# Patient Record
Sex: Male | Born: 1960 | Race: White | Hispanic: No | Marital: Married | Smoking: Never smoker
Health system: Southern US, Community
[De-identification: ages and names within clinical notes are randomized; demographics above are authoritative.]

## PROBLEM LIST (undated history)

## (undated) DIAGNOSIS — E109 Type 1 diabetes mellitus without complications: Secondary | ICD-10-CM

## (undated) DIAGNOSIS — Z95810 Presence of automatic (implantable) cardiac defibrillator: Secondary | ICD-10-CM

## (undated) DIAGNOSIS — C44621 Squamous cell carcinoma of skin of unspecified upper limb, including shoulder: Secondary | ICD-10-CM

## (undated) DIAGNOSIS — S68119A Complete traumatic metacarpophalangeal amputation of unspecified finger, initial encounter: Secondary | ICD-10-CM

## (undated) DIAGNOSIS — Z5189 Encounter for other specified aftercare: Secondary | ICD-10-CM

## (undated) DIAGNOSIS — M1990 Unspecified osteoarthritis, unspecified site: Secondary | ICD-10-CM

## (undated) DIAGNOSIS — E039 Hypothyroidism, unspecified: Secondary | ICD-10-CM

## (undated) DIAGNOSIS — I2510 Atherosclerotic heart disease of native coronary artery without angina pectoris: Secondary | ICD-10-CM

## (undated) DIAGNOSIS — I629 Nontraumatic intracranial hemorrhage, unspecified: Secondary | ICD-10-CM

## (undated) DIAGNOSIS — I472 Ventricular tachycardia, unspecified: Secondary | ICD-10-CM

## (undated) DIAGNOSIS — E1143 Type 2 diabetes mellitus with diabetic autonomic (poly)neuropathy: Secondary | ICD-10-CM

## (undated) DIAGNOSIS — Z22322 Carrier or suspected carrier of Methicillin resistant Staphylococcus aureus: Secondary | ICD-10-CM

## (undated) DIAGNOSIS — E785 Hyperlipidemia, unspecified: Secondary | ICD-10-CM

## (undated) DIAGNOSIS — I312 Hemopericardium, not elsewhere classified: Secondary | ICD-10-CM

## (undated) DIAGNOSIS — Z95828 Presence of other vascular implants and grafts: Secondary | ICD-10-CM

## (undated) DIAGNOSIS — I10 Essential (primary) hypertension: Secondary | ICD-10-CM

## (undated) DIAGNOSIS — N186 End stage renal disease: Secondary | ICD-10-CM

## (undated) DIAGNOSIS — R011 Cardiac murmur, unspecified: Secondary | ICD-10-CM

## (undated) DIAGNOSIS — T82198A Other mechanical complication of other cardiac electronic device, initial encounter: Secondary | ICD-10-CM

## (undated) DIAGNOSIS — Z992 Dependence on renal dialysis: Secondary | ICD-10-CM

## (undated) DIAGNOSIS — K279 Peptic ulcer, site unspecified, unspecified as acute or chronic, without hemorrhage or perforation: Secondary | ICD-10-CM

## (undated) DIAGNOSIS — H548 Legal blindness, as defined in USA: Secondary | ICD-10-CM

## (undated) DIAGNOSIS — N2581 Secondary hyperparathyroidism of renal origin: Secondary | ICD-10-CM

## (undated) DIAGNOSIS — IMO0002 Reserved for concepts with insufficient information to code with codable children: Secondary | ICD-10-CM

## (undated) DIAGNOSIS — Z89519 Acquired absence of unspecified leg below knee: Secondary | ICD-10-CM

## (undated) DIAGNOSIS — K8010 Calculus of gallbladder with chronic cholecystitis without obstruction: Principal | ICD-10-CM

## (undated) DIAGNOSIS — R0989 Other specified symptoms and signs involving the circulatory and respiratory systems: Secondary | ICD-10-CM

## (undated) DIAGNOSIS — N529 Male erectile dysfunction, unspecified: Secondary | ICD-10-CM

## (undated) DIAGNOSIS — K219 Gastro-esophageal reflux disease without esophagitis: Secondary | ICD-10-CM

## (undated) DIAGNOSIS — I428 Other cardiomyopathies: Secondary | ICD-10-CM

## (undated) HISTORY — DX: Reserved for concepts with insufficient information to code with codable children: IMO0002

## (undated) HISTORY — DX: Gastro-esophageal reflux disease without esophagitis: K21.9

## (undated) HISTORY — PX: INSERT / REPLACE / REMOVE PACEMAKER: SUR710

## (undated) HISTORY — DX: Type 1 diabetes mellitus without complications: E10.9

## (undated) HISTORY — DX: Other cardiomyopathies: I42.8

## (undated) HISTORY — DX: Presence of automatic (implantable) cardiac defibrillator: Z95.810

## (undated) HISTORY — DX: Atherosclerotic heart disease of native coronary artery without angina pectoris: I25.10

## (undated) HISTORY — DX: Essential (primary) hypertension: I10

## (undated) HISTORY — PX: VASCULAR SURGERY: SHX849

## (undated) HISTORY — DX: Carrier or suspected carrier of methicillin resistant Staphylococcus aureus: Z22.322

## (undated) HISTORY — DX: Hyperlipidemia, unspecified: E78.5

## (undated) HISTORY — DX: Cardiac murmur, unspecified: R01.1

## (undated) HISTORY — DX: Unspecified osteoarthritis, unspecified site: M19.90

## (undated) HISTORY — DX: Complete traumatic metacarpophalangeal amputation of unspecified finger, initial encounter: S68.119A

## (undated) HISTORY — DX: Male erectile dysfunction, unspecified: N52.9

## (undated) HISTORY — DX: Secondary hyperparathyroidism of renal origin: N25.81

## (undated) HISTORY — DX: Type 2 diabetes mellitus with diabetic autonomic (poly)neuropathy: E11.43

## (undated) HISTORY — DX: Acquired absence of unspecified leg below knee: Z89.519

## (undated) HISTORY — DX: End stage renal disease: Z99.2

## (undated) HISTORY — DX: End stage renal disease: N18.6

## (undated) HISTORY — DX: Squamous cell carcinoma of skin of unspecified upper limb, including shoulder: C44.621

## (undated) HISTORY — PX: CORONARY ARTERY BYPASS GRAFT: SHX141

## (undated) HISTORY — DX: Nontraumatic intracranial hemorrhage, unspecified: I62.9

## (undated) HISTORY — PX: EYE SURGERY: SHX253

## (undated) HISTORY — DX: Hemopericardium, not elsewhere classified: I31.2

## (undated) HISTORY — DX: Hypothyroidism, unspecified: E03.9

## (undated) HISTORY — PX: ABLATION: SHX5711

## (undated) HISTORY — DX: Encounter for other specified aftercare: Z51.89

## (undated) HISTORY — DX: Other specified symptoms and signs involving the circulatory and respiratory systems: R09.89

## (undated) HISTORY — DX: Calculus of gallbladder with chronic cholecystitis without obstruction: K80.10

## (undated) HISTORY — DX: Peptic ulcer, site unspecified, unspecified as acute or chronic, without hemorrhage or perforation: K27.9

---

## 1997-11-29 ENCOUNTER — Ambulatory Visit (HOSPITAL_COMMUNITY): Admission: RE | Admit: 1997-11-29 | Discharge: 1997-11-29 | Payer: Self-pay | Admitting: Neurosurgery

## 1999-02-01 ENCOUNTER — Encounter: Payer: Self-pay | Admitting: General Surgery

## 1999-02-01 ENCOUNTER — Encounter: Admission: RE | Admit: 1999-02-01 | Discharge: 1999-02-01 | Payer: Self-pay | Admitting: General Surgery

## 1999-02-13 ENCOUNTER — Encounter: Admission: RE | Admit: 1999-02-13 | Discharge: 1999-02-13 | Payer: Self-pay | Admitting: General Surgery

## 1999-02-13 ENCOUNTER — Encounter (INDEPENDENT_AMBULATORY_CARE_PROVIDER_SITE_OTHER): Payer: Self-pay | Admitting: *Deleted

## 1999-02-13 ENCOUNTER — Encounter: Payer: Self-pay | Admitting: General Surgery

## 1999-02-13 ENCOUNTER — Ambulatory Visit (HOSPITAL_BASED_OUTPATIENT_CLINIC_OR_DEPARTMENT_OTHER): Admission: RE | Admit: 1999-02-13 | Discharge: 1999-02-13 | Payer: Self-pay | Admitting: General Surgery

## 2000-03-04 HISTORY — PX: TRANSPLANT PANCREATIC ALLOGRAFT: SUR1383

## 2000-03-04 HISTORY — PX: KIDNEY TRANSPLANT: SHX239

## 2000-11-18 ENCOUNTER — Encounter: Payer: Self-pay | Admitting: Vascular Surgery

## 2000-11-18 ENCOUNTER — Ambulatory Visit (HOSPITAL_COMMUNITY): Admission: RE | Admit: 2000-11-18 | Discharge: 2000-11-18 | Payer: Self-pay | Admitting: Vascular Surgery

## 2000-11-19 ENCOUNTER — Emergency Department (HOSPITAL_COMMUNITY): Admission: EM | Admit: 2000-11-19 | Discharge: 2000-11-19 | Payer: Self-pay | Admitting: Emergency Medicine

## 2003-09-02 ENCOUNTER — Ambulatory Visit (HOSPITAL_COMMUNITY): Admission: RE | Admit: 2003-09-02 | Discharge: 2003-09-02 | Payer: Self-pay | Admitting: General Surgery

## 2003-09-02 ENCOUNTER — Ambulatory Visit (HOSPITAL_BASED_OUTPATIENT_CLINIC_OR_DEPARTMENT_OTHER): Admission: RE | Admit: 2003-09-02 | Discharge: 2003-09-02 | Payer: Self-pay | Admitting: General Surgery

## 2003-09-02 ENCOUNTER — Encounter (INDEPENDENT_AMBULATORY_CARE_PROVIDER_SITE_OTHER): Payer: Self-pay | Admitting: *Deleted

## 2004-01-03 HISTORY — PX: CARDIAC DEFIBRILLATOR PLACEMENT: SHX171

## 2004-01-08 ENCOUNTER — Ambulatory Visit: Payer: Self-pay | Admitting: Internal Medicine

## 2004-01-08 ENCOUNTER — Ambulatory Visit: Payer: Self-pay | Admitting: Cardiology

## 2004-01-09 ENCOUNTER — Inpatient Hospital Stay (HOSPITAL_COMMUNITY): Admission: EM | Admit: 2004-01-09 | Discharge: 2004-01-12 | Payer: Self-pay | Admitting: Cardiology

## 2004-02-01 ENCOUNTER — Ambulatory Visit: Payer: Self-pay | Admitting: Internal Medicine

## 2004-04-05 ENCOUNTER — Ambulatory Visit: Payer: Self-pay | Admitting: Internal Medicine

## 2004-04-05 ENCOUNTER — Inpatient Hospital Stay (HOSPITAL_COMMUNITY): Admission: AD | Admit: 2004-04-05 | Discharge: 2004-04-09 | Payer: Self-pay | Admitting: Cardiology

## 2004-04-15 ENCOUNTER — Ambulatory Visit: Payer: Self-pay | Admitting: *Deleted

## 2004-04-15 ENCOUNTER — Inpatient Hospital Stay (HOSPITAL_COMMUNITY): Admission: EM | Admit: 2004-04-15 | Discharge: 2004-04-25 | Payer: Self-pay | Admitting: Emergency Medicine

## 2004-05-15 ENCOUNTER — Encounter: Admission: RE | Admit: 2004-05-15 | Discharge: 2004-05-15 | Payer: Self-pay | Admitting: Surgery

## 2004-05-29 ENCOUNTER — Ambulatory Visit: Payer: Self-pay | Admitting: Internal Medicine

## 2004-10-10 ENCOUNTER — Ambulatory Visit: Payer: Self-pay | Admitting: Internal Medicine

## 2005-02-25 ENCOUNTER — Emergency Department (HOSPITAL_COMMUNITY): Admission: EM | Admit: 2005-02-25 | Discharge: 2005-02-25 | Payer: Self-pay | Admitting: Emergency Medicine

## 2005-03-21 ENCOUNTER — Ambulatory Visit: Payer: Self-pay | Admitting: Internal Medicine

## 2005-06-02 ENCOUNTER — Ambulatory Visit: Payer: Self-pay | Admitting: Internal Medicine

## 2005-07-03 ENCOUNTER — Encounter: Payer: Self-pay | Admitting: Neurosurgery

## 2005-09-11 ENCOUNTER — Ambulatory Visit: Payer: Self-pay | Admitting: Internal Medicine

## 2005-10-01 ENCOUNTER — Ambulatory Visit (HOSPITAL_COMMUNITY): Admission: RE | Admit: 2005-10-01 | Discharge: 2005-10-01 | Payer: Self-pay | Admitting: Nephrology

## 2006-01-28 ENCOUNTER — Inpatient Hospital Stay (HOSPITAL_COMMUNITY): Admission: AD | Admit: 2006-01-28 | Discharge: 2006-02-02 | Payer: Self-pay | Admitting: Nephrology

## 2006-03-07 ENCOUNTER — Ambulatory Visit: Payer: Self-pay | Admitting: Internal Medicine

## 2006-06-09 ENCOUNTER — Ambulatory Visit: Payer: Self-pay | Admitting: Internal Medicine

## 2006-09-09 ENCOUNTER — Ambulatory Visit: Payer: Self-pay | Admitting: Internal Medicine

## 2006-12-08 ENCOUNTER — Ambulatory Visit: Payer: Self-pay | Admitting: Internal Medicine

## 2007-03-02 ENCOUNTER — Ambulatory Visit (HOSPITAL_COMMUNITY): Admission: RE | Admit: 2007-03-02 | Discharge: 2007-03-02 | Payer: Self-pay | Admitting: Vascular Surgery

## 2007-03-02 ENCOUNTER — Ambulatory Visit: Payer: Self-pay | Admitting: Vascular Surgery

## 2007-03-04 ENCOUNTER — Ambulatory Visit (HOSPITAL_COMMUNITY): Admission: RE | Admit: 2007-03-04 | Discharge: 2007-03-04 | Payer: Self-pay | Admitting: Vascular Surgery

## 2007-03-05 DIAGNOSIS — Z95828 Presence of other vascular implants and grafts: Secondary | ICD-10-CM

## 2007-03-05 HISTORY — DX: Presence of other vascular implants and grafts: Z95.828

## 2007-03-14 ENCOUNTER — Inpatient Hospital Stay (HOSPITAL_COMMUNITY): Admission: AD | Admit: 2007-03-14 | Discharge: 2007-03-16 | Payer: Self-pay | Admitting: Nephrology

## 2007-03-17 ENCOUNTER — Ambulatory Visit: Payer: Self-pay | Admitting: Cardiology

## 2007-03-17 ENCOUNTER — Ambulatory Visit: Payer: Self-pay | Admitting: Internal Medicine

## 2007-03-17 ENCOUNTER — Ambulatory Visit: Payer: Self-pay | Admitting: Vascular Surgery

## 2007-03-18 ENCOUNTER — Inpatient Hospital Stay (HOSPITAL_COMMUNITY): Admission: EM | Admit: 2007-03-18 | Discharge: 2007-03-19 | Payer: Self-pay | Admitting: Emergency Medicine

## 2007-03-18 ENCOUNTER — Encounter: Payer: Self-pay | Admitting: Internal Medicine

## 2007-04-02 ENCOUNTER — Ambulatory Visit: Payer: Self-pay | Admitting: Internal Medicine

## 2007-06-09 ENCOUNTER — Ambulatory Visit: Payer: Self-pay | Admitting: Internal Medicine

## 2007-08-06 ENCOUNTER — Ambulatory Visit: Payer: Self-pay | Admitting: Internal Medicine

## 2007-09-02 ENCOUNTER — Inpatient Hospital Stay (HOSPITAL_COMMUNITY): Admission: EM | Admit: 2007-09-02 | Discharge: 2007-09-04 | Payer: Self-pay | Admitting: Emergency Medicine

## 2007-09-09 ENCOUNTER — Ambulatory Visit: Payer: Self-pay | Admitting: Vascular Surgery

## 2007-09-09 ENCOUNTER — Ambulatory Visit (HOSPITAL_COMMUNITY): Admission: RE | Admit: 2007-09-09 | Discharge: 2007-09-09 | Payer: Self-pay | Admitting: Vascular Surgery

## 2007-09-15 ENCOUNTER — Ambulatory Visit (HOSPITAL_COMMUNITY): Admission: RE | Admit: 2007-09-15 | Discharge: 2007-09-16 | Payer: Self-pay | Admitting: Vascular Surgery

## 2007-09-15 ENCOUNTER — Encounter: Payer: Self-pay | Admitting: Vascular Surgery

## 2007-09-17 ENCOUNTER — Inpatient Hospital Stay (HOSPITAL_COMMUNITY): Admission: EM | Admit: 2007-09-17 | Discharge: 2007-09-21 | Payer: Self-pay | Admitting: Emergency Medicine

## 2007-09-29 ENCOUNTER — Ambulatory Visit: Payer: Self-pay | Admitting: Vascular Surgery

## 2007-10-20 ENCOUNTER — Ambulatory Visit: Payer: Self-pay | Admitting: Vascular Surgery

## 2007-11-10 ENCOUNTER — Ambulatory Visit: Payer: Self-pay | Admitting: Vascular Surgery

## 2007-11-26 ENCOUNTER — Ambulatory Visit: Payer: Self-pay | Admitting: Internal Medicine

## 2008-01-05 ENCOUNTER — Ambulatory Visit: Payer: Self-pay | Admitting: Vascular Surgery

## 2008-02-02 ENCOUNTER — Ambulatory Visit (HOSPITAL_COMMUNITY): Admission: RE | Admit: 2008-02-02 | Discharge: 2008-02-02 | Payer: Self-pay | Admitting: Vascular Surgery

## 2008-02-02 ENCOUNTER — Ambulatory Visit: Payer: Self-pay | Admitting: Vascular Surgery

## 2008-02-16 ENCOUNTER — Ambulatory Visit: Payer: Self-pay | Admitting: Vascular Surgery

## 2008-03-09 ENCOUNTER — Ambulatory Visit: Payer: Self-pay | Admitting: Vascular Surgery

## 2008-03-21 ENCOUNTER — Ambulatory Visit: Payer: Self-pay | Admitting: Internal Medicine

## 2008-03-30 ENCOUNTER — Ambulatory Visit: Payer: Self-pay | Admitting: Vascular Surgery

## 2008-04-28 ENCOUNTER — Ambulatory Visit: Payer: Self-pay | Admitting: Cardiology

## 2008-04-28 ENCOUNTER — Observation Stay (HOSPITAL_COMMUNITY): Admission: EM | Admit: 2008-04-28 | Discharge: 2008-04-29 | Payer: Self-pay | Admitting: Emergency Medicine

## 2008-05-19 ENCOUNTER — Ambulatory Visit (HOSPITAL_COMMUNITY): Admission: RE | Admit: 2008-05-19 | Discharge: 2008-05-19 | Payer: Self-pay | Admitting: Orthopedic Surgery

## 2008-05-19 ENCOUNTER — Encounter (INDEPENDENT_AMBULATORY_CARE_PROVIDER_SITE_OTHER): Payer: Self-pay | Admitting: Orthopedic Surgery

## 2008-05-31 ENCOUNTER — Encounter: Payer: Self-pay | Admitting: Internal Medicine

## 2008-06-14 DIAGNOSIS — Z862 Personal history of diseases of the blood and blood-forming organs and certain disorders involving the immune mechanism: Secondary | ICD-10-CM

## 2008-06-14 DIAGNOSIS — I10 Essential (primary) hypertension: Secondary | ICD-10-CM | POA: Insufficient documentation

## 2008-06-14 DIAGNOSIS — Z8639 Personal history of other endocrine, nutritional and metabolic disease: Secondary | ICD-10-CM

## 2008-06-14 DIAGNOSIS — E039 Hypothyroidism, unspecified: Secondary | ICD-10-CM

## 2008-06-14 DIAGNOSIS — R0989 Other specified symptoms and signs involving the circulatory and respiratory systems: Secondary | ICD-10-CM | POA: Insufficient documentation

## 2008-06-14 DIAGNOSIS — Z95810 Presence of automatic (implantable) cardiac defibrillator: Secondary | ICD-10-CM

## 2008-06-16 ENCOUNTER — Encounter: Payer: Self-pay | Admitting: Internal Medicine

## 2008-06-16 ENCOUNTER — Ambulatory Visit: Payer: Self-pay | Admitting: Internal Medicine

## 2008-06-16 DIAGNOSIS — N186 End stage renal disease: Secondary | ICD-10-CM

## 2008-06-16 DIAGNOSIS — R071 Chest pain on breathing: Secondary | ICD-10-CM

## 2008-06-16 DIAGNOSIS — I5022 Chronic systolic (congestive) heart failure: Secondary | ICD-10-CM

## 2008-09-19 ENCOUNTER — Telehealth: Payer: Self-pay | Admitting: Internal Medicine

## 2008-09-20 ENCOUNTER — Ambulatory Visit: Payer: Self-pay | Admitting: Internal Medicine

## 2008-09-21 ENCOUNTER — Encounter: Payer: Self-pay | Admitting: Internal Medicine

## 2008-10-13 ENCOUNTER — Ambulatory Visit: Payer: Self-pay | Admitting: Internal Medicine

## 2008-10-13 ENCOUNTER — Encounter: Payer: Self-pay | Admitting: Internal Medicine

## 2008-10-17 ENCOUNTER — Telehealth (INDEPENDENT_AMBULATORY_CARE_PROVIDER_SITE_OTHER): Payer: Self-pay | Admitting: *Deleted

## 2008-10-27 ENCOUNTER — Encounter (INDEPENDENT_AMBULATORY_CARE_PROVIDER_SITE_OTHER): Payer: Self-pay | Admitting: Orthopedic Surgery

## 2008-10-27 ENCOUNTER — Ambulatory Visit (HOSPITAL_BASED_OUTPATIENT_CLINIC_OR_DEPARTMENT_OTHER): Admission: RE | Admit: 2008-10-27 | Discharge: 2008-10-27 | Payer: Self-pay | Admitting: Orthopedic Surgery

## 2008-12-03 ENCOUNTER — Inpatient Hospital Stay (HOSPITAL_COMMUNITY): Admission: EM | Admit: 2008-12-03 | Discharge: 2008-12-04 | Payer: Self-pay | Admitting: Emergency Medicine

## 2008-12-05 ENCOUNTER — Inpatient Hospital Stay (HOSPITAL_COMMUNITY): Admission: EM | Admit: 2008-12-05 | Discharge: 2008-12-11 | Payer: Self-pay | Admitting: Emergency Medicine

## 2008-12-13 ENCOUNTER — Encounter: Payer: Self-pay | Admitting: Internal Medicine

## 2008-12-15 ENCOUNTER — Ambulatory Visit: Payer: Self-pay | Admitting: Internal Medicine

## 2009-03-04 HISTORY — PX: TOE AMPUTATION: SHX809

## 2009-03-13 ENCOUNTER — Ambulatory Visit (HOSPITAL_COMMUNITY): Admission: RE | Admit: 2009-03-13 | Discharge: 2009-03-13 | Payer: Self-pay | Admitting: General Surgery

## 2009-03-17 ENCOUNTER — Encounter: Payer: Self-pay | Admitting: Internal Medicine

## 2009-03-18 ENCOUNTER — Encounter: Payer: Self-pay | Admitting: Internal Medicine

## 2009-03-20 ENCOUNTER — Ambulatory Visit: Payer: Self-pay | Admitting: Internal Medicine

## 2009-03-21 ENCOUNTER — Encounter: Payer: Self-pay | Admitting: Internal Medicine

## 2009-03-28 ENCOUNTER — Ambulatory Visit (HOSPITAL_COMMUNITY): Admission: RE | Admit: 2009-03-28 | Discharge: 2009-03-28 | Payer: Self-pay | Admitting: General Surgery

## 2009-05-31 ENCOUNTER — Emergency Department (HOSPITAL_COMMUNITY)
Admission: EM | Admit: 2009-05-31 | Discharge: 2009-05-31 | Payer: Self-pay | Source: Home / Self Care | Admitting: Emergency Medicine

## 2009-06-29 ENCOUNTER — Encounter: Payer: Self-pay | Admitting: Internal Medicine

## 2009-08-08 ENCOUNTER — Ambulatory Visit: Payer: Self-pay | Admitting: Internal Medicine

## 2009-08-09 ENCOUNTER — Encounter: Payer: Self-pay | Admitting: Internal Medicine

## 2009-08-14 ENCOUNTER — Telehealth: Payer: Self-pay | Admitting: Internal Medicine

## 2009-08-15 ENCOUNTER — Telehealth: Payer: Self-pay | Admitting: Internal Medicine

## 2009-08-21 ENCOUNTER — Encounter: Payer: Self-pay | Admitting: Internal Medicine

## 2009-08-29 ENCOUNTER — Ambulatory Visit: Payer: Self-pay | Admitting: Internal Medicine

## 2009-08-29 ENCOUNTER — Ambulatory Visit (HOSPITAL_COMMUNITY): Admission: RE | Admit: 2009-08-29 | Discharge: 2009-08-29 | Payer: Self-pay | Admitting: Internal Medicine

## 2009-08-30 ENCOUNTER — Telehealth: Payer: Self-pay | Admitting: Internal Medicine

## 2009-09-14 ENCOUNTER — Ambulatory Visit: Payer: Self-pay

## 2009-10-09 ENCOUNTER — Telehealth: Payer: Self-pay | Admitting: Internal Medicine

## 2009-10-31 ENCOUNTER — Encounter: Payer: Self-pay | Admitting: Internal Medicine

## 2009-11-24 ENCOUNTER — Telehealth: Payer: Self-pay | Admitting: Internal Medicine

## 2009-11-24 ENCOUNTER — Encounter: Payer: Self-pay | Admitting: Internal Medicine

## 2009-12-19 ENCOUNTER — Ambulatory Visit: Payer: Self-pay | Admitting: Internal Medicine

## 2010-02-07 ENCOUNTER — Telehealth: Payer: Self-pay | Admitting: Internal Medicine

## 2010-03-21 ENCOUNTER — Telehealth: Payer: Self-pay | Admitting: Internal Medicine

## 2010-03-22 ENCOUNTER — Ambulatory Visit: Admit: 2010-03-22 | Payer: Self-pay | Admitting: Internal Medicine

## 2010-03-23 ENCOUNTER — Encounter (INDEPENDENT_AMBULATORY_CARE_PROVIDER_SITE_OTHER): Payer: Self-pay | Admitting: *Deleted

## 2010-04-02 ENCOUNTER — Encounter: Payer: Self-pay | Admitting: Internal Medicine

## 2010-04-03 NOTE — Letter (Signed)
Summary: Device-Delinquent Phone Journalist, newspaper, Main Office  [address]   [place], [place] [address]   Phone: [phone]  Fax: [phone]     March 17, 2009 MRN: [id number]   Nadia Reim [address] [place], [place]  [address]   Dear Mr. WILNER,  According to our records, you were scheduled for a device phone transmission on  March 13, 2009.     We did not receive any results from this check.  If you transmitted on your scheduled day, please call us to help troubleshoot your system.  If you forgot to send your transmission, please send one upon receipt of this letter.  Thank you,   Architectural techn[facility]

## 2010-04-03 NOTE — Assessment & Plan Note (Signed)
Summary: 6 month/dmp   Visit Type:  Follow-up Primary Provider:  Dr. Modesto Charon   History of Present Illness: [name] returns today for followup.  He is a pleasant middle aged man with a h/o VT and a DCM and ESRD on HD.  He continues to experience problems with CHF though lately he has been mostly class2. He has had no recent ICD therapies.  He c/o low blood pressure despite being off of most of his anti-hypertensive meds.  He has been seen by a cardiologist at [facility] and sent back to consider BiV ICD upgrade.   No intercurrent ICD therapies.  His CHF is class 3 and he has LBBB with an EF 25%.  Current Medications (verified): 1)  Prograf 1 Mg Caps (Tacrolimus) .... 2 Capsules Two Times A Day 2)  Prednisone 5 Mg Tabs (Prednisone) .[name].. 1 By Mouth Once Daily 3)  Synthroid 200 Mcg Tabs (Levothyroxine Sodium) .... Once Daily 4)  Nephro-Vite Rx 1 Mg Tabs (B Complex-C-Folic Acid) .[name].. 1 Ppo Once Daily 5)  Crestor 5 Mg Tabs (Rosuvastatin Calcium) .... Take One Tablet By Mouth Daily. 6)  Nizatidine 150 Mg Caps (Nizatidine) .[name].. 1 By Mouth Once Daily 7)  Azathioprine 50 Mg Tabs (Azathioprine) .... Take 1 and 1/2 Tab Once Daily 8)  Salmon Oil-1000 200 Mg Caps (Omega-3 Fatty Acids) .... 100mg  Once Daily 9)  Lutein 20 Mg Tabs (Lutein) .[name].. 1 By Mouth Once Daily 10)  Fosrenol 1000 Mg Chew (Lanthanum Carbonate) .[name].. 1 Taq Four Times A Day 11)  Lumigan 0.03 % Soln (Bimatoprost) .[name].. 1 Gtt Once Daily 12)  Dilantin 100 Mg Caps (Phenytoin Sodium Extended) .[name].. 1 By Mouth Q8 13)  Proamatine 5 Mg Tabs (Midodrine Hcl) .... Take One By Mouth Before Dialysis and If Bp< 100 14)  Crestor 10 Mg Tabs (Rosuvastatin Calcium) .... Take 1/2tablet By Mouth Every Other Day 15)  Lisinopril 5 Mg Tabs (Lisinopril) .... Take One Tablet By Mouth Daily  Allergies (verified): 1)  ! * Protamine 2)  ! * Dialudid 3)  ! * Avelox 4)  ! Cipro (Ciprofloxacin) 5)  ! Rocephin  Past History:  Past Medical History: Last  updated: 06/14/2008 HYPERPARATHYROIDISM, HX OF (ICD-V12.2) HYPOTHYROIDISM (ICD-244.9) HYPERTENSION, UNSPECIFIED (ICD-401.9) ICD - IN SITU (ICD-V45.02) TACHYCARDIA (ICD-785)    Past Surgical History: Last updated: 06/14/2008 ICD - St Jude -- 11/05  Review of Systems       The patient complains of dyspnea on exertion.  The patient denies chest pain, syncope, and peripheral edema.    Vital Signs:  Patient profile:   50 year old male Height:      70 inches Weight:      146 pounds BMI:     21.02 Pulse rate:   75 / minute BP sitting:   126 / 70  (left arm)  Vitals Entered By: Laurance Flatten CMA (August 08, 2009 2:48 PM)  Physical Exam  General:  Chronicallly ill appearing, NAD. Head:  normocephalic and atraumatic Eyes:  PERRLA/EOM intact; conjunctiva and lids normal. Mouth:  Teeth, gums and palate normal. Oral mucosa normal. Neck:  Neck supple, no JVD. No masses, thyromegaly or abnormal cervical nodes. Lungs:  Clear bilaterally to auscultation without wheezes, rales, or rhonchi or increased work of breathing. Heart:  RRR with normal S1 and S2.  PMI is enlarged and laterally displaced.  A soft systolic murmur is present at the RUSB. Abdomen:  Bowel sounds positive; abdomen soft and non-tender without masses, organomegaly, or  hernias noted. No hepatosplenomegaly. Msk:  Back normal, normal gait. Muscle strength and tone normal. Pulses:  pulses normal in all 4 extremities Extremities:  No clubbing or cyanosis. No edema. Neurologic:  Alert and oriented x 3.    ICD Specifications Following MD:  [name] Bunting, MD     ICD Vendor:  [facility] Jude     ICD Model Number:  [phone]     ICD Serial Number:  [id number] ICD DOI:  01/11/2004     ICD Implanting MD:  [name] Bunting, MD  Lead 1:    Location: RA     DOI: 01/11/2004     Model #: 1488TC     Serial #: [id number]     Status: active Lead 2:    Location: RV     DOI: 01/11/2004     Model #: 1580     Serial #: [id number]     Status: active  Indications::   SMVT   ICD Follow Up Battery Voltage:  2.53 V     Charge Time:  13.3 seconds     Underlying rhythm:  SR ICD Dependent:  No       ICD Device Measurements Atrium:  Amplitude: 2.4 mV, Impedance: 360 ohms, Threshold: 0.5 V at 0.8 msec Right Ventricle:  Amplitude: 3.8 mV, Impedance: 295 ohms, Threshold: 3.0 V at 1.5 msec Shock Impedance: 54 ohms   Episodes MS Episodes:  3     Percent Mode Switch:  <1%     Coumadin:  No Shock:  0     ATP:  0     Nonsustained:  0     Atrial Therapies:  0 Atrial Pacing:  9%     Ventricular Pacing:  88%  Brady Parameters Mode DDIR     Lower Rate Limit:  60     Upper Rate Limit 95 PAV 180     Sensed AV Delay:  325  Tachy Zones VF:  240     VT:  214     VT1:  174     Next Remote Date:  10/09/2009     Tech Comments:  BATTERY VOLTAGE 2.53-8 WK MERLIN CHECKS.  PT HAVING RUNS OF ATRIAL TACHYCARDIA THAT DEVICE WAS TRACKING RESULTING ABNORMAL HISTOGRAM DISTRIBUTION.  ALL MODE SWITCH EPISODES LESS THAN 1 MINUTE. MERLIN CHECK 10-09-09. Vella Kohler  August 08, 2009 3:34 PM MD Comments:  Agree with above.  Impression & Recommendations:  Problem # 1:  CHRONIC SYSTOLIC HEART FAILURE (ICD-428.22) His symptoms remain class 3.  In addition to continued medical therapy, he will proceed with BiV Upgrade in the setting of above. The following medications were removed from the medication list:    Metoprolol Succinate 25 Mg Xr24h-tab (Metoprolol succinate) .[name]... Take one tablet by mouth daily    Metoprolol Tartrate 25 Mg Tabs (Metoprolol tartrate) .[name]... Take one tablet by mouth if bp greater than 140 or more His updated medication list for this problem includes:    Lisinopril 5 Mg Tabs (Lisinopril) .[name]... Take one tablet by mouth daily  Problem # 2:  HYPERTENSION, UNSPECIFIED (ICD-401.9) His blood pressure remains well controlled.  A low sodium diet is recommended. The following medications were removed from the medication list:    Metoprolol Succinate 25 Mg Xr24h-tab  (Metoprolol succinate) .[name]... Take one tablet by mouth daily    Metoprolol Tartrate 25 Mg Tabs (Metoprolol tartrate) .[name]... Take one tablet by mouth if bp greater than 140 or more His updated medication list for this problem  includes:    Lisinopril 5 Mg Tabs (Lisinopril) .[name]... Take one tablet by mouth daily  Problem # 3:  ICD - IN SITU (ICD-V45.02) His device is working  normally but is approaching ERI.  Will plan BiV upgrade.  Patient Instructions: 1)  You are scheduled for a device check from home on October 09, 2009 . You may send your transmission at any time that day. If you have a wireless device, the transmission will be sent automatically. After your physician reviews your transmission, you will receive a postcard with your next transmission date.

## 2010-04-03 NOTE — Progress Notes (Signed)
Summary: pt has cough what can he do  Phone Note Call from Patient Call back at Home Phone [phone]   Caller: Patient Reason for Call: Talk to Nurse, Talk to Doctor Summary of Call: pt is on lisinopril and has a cough and wondering was he alergic to the medication Initial call taken by: Omer Jack,  August 15, 2009 3:46 PM  Follow-up for Phone Call        ok to stop for a week and se if the cough goes away.  pt will call me back and let me know if it helps after he stops for a week Dennis Bast, RN, BSN  August 15, 2009 6:22 PM

## 2010-04-03 NOTE — Progress Notes (Signed)
Summary: Question about medication  Phone Note Call from Patient Call back at Home Phone [phone]   Caller: Patient Summary of Call: Pt have question about start on Lisinopril Initial call taken by: Judie Grieve,  October 09, 2009 3:55 PM  Follow-up for Phone Call        pt d/c'd Lisonpril to see if cough got any better  It did not  He still has the cough and will get it evaluated by his primary care doctor.  will start back on Lisinopril 5mg  daily Dennis Bast, RN, BSN  October 09, 2009 4:37 PM

## 2010-04-03 NOTE — Progress Notes (Signed)
Summary: TALK TO DR Ladona Ridgel  Phone Note From Other Clinic   Caller: Kwesi Sangha Summary of Call: DR Jean Rosenthal FROM [facility] WOULD LIKE TO SPEAK TO YOU CONCERNING THIS PT. DR IS AWARE YOU ARE OUT OF THE OFFICE. Fuller Song [facility] [phone] OR [facility] [phone] Initial call taken by: Edman Circle,  November 24, 2009 10:12 AM  Follow-up for Phone Call        I will followup. Follow-up by: Laren Boom, MD, [facility],  December 10, 2009 9:33 PM

## 2010-04-03 NOTE — Cardiovascular Report (Signed)
Summary: Office Visit   Office Visit   Imported By: Roderic Ovens 08/18/2009 14:56:38  _____________________________________________________________________  External Attachment:    Type:   Image     Comment:   External Document

## 2010-04-03 NOTE — Letter (Signed)
Summary: Remote Device Check  [employer], Main Office  [address]   [place], [place] [address]   Phone: [phone]  Fax: [phone]     March 21, 2009 MRN: [id number]   [name] [address] [place], [place]  [address]   Dear Mr. COLLER,   Your remote transmission was recieved and reviewed by your physician.  All diagnostics were within normal limits for you.    __X____Your next office visit is scheduled for:  APRIL 2011 WITH DR Ladona Ridgel. Please call our office to schedule an appointment.    Sincerely,  Proofreader

## 2010-04-03 NOTE — Consult Note (Signed)
Summary: [facility]  [facility]   Imported By: Harlon Flor 11/10/2009 16:37:46  _____________________________________________________________________  External Attachment:    Type:   Image     Comment:   External Document

## 2010-04-03 NOTE — Assessment & Plan Note (Signed)
Summary: icd check/st. jude   Visit Type:  Follow-up Primary [name]:  Dr. Modesto Charon   History of Present Illness: [name] returns today for followup.  He is a pleasant middle aged man with a h/o VT and a DCM and ESRD on HD.  He continues to experience problems with CHF though lately he has been mostly class2. He has had no recent ICD therapies.  He underwent insertion of a new LV lead at [facility] several weeks ago and has done well.  This LV lead was placed from the Right subclavian vein ( previously occluded) and tunneled over to the right side.   No intercurrent ICD therapies.  His CHF is class 2 and he has LBBB with an EF 25%.  Current Medications (verified): 1)  Prograf 1 Mg Caps (Tacrolimus) .... 2 Capsules Two Times A Day 2)  Prednisone 5 Mg Tabs (Prednisone) .[name].. 1 By Mouth Once Daily 3)  Synthroid 200 Mcg Tabs (Levothyroxine Sodium) .... Once Daily 4)  Nephro-Vite Rx 1 Mg Tabs (B Complex-C-Folic Acid) .[name].. 1 Ppo Once Daily 5)  Crestor 5 Mg Tabs (Rosuvastatin Calcium) .... Take One Tablet By Mouth Every Other Day 6)  Nizatidine 150 Mg Caps (Nizatidine) .[name].. 1 By Mouth Once Daily 7)  Azathioprine 50 Mg Tabs (Azathioprine) .... Take 1 and 1/2 Tab Once Daily 8)  Salmon Oil-1000 200 Mg Caps (Omega-3 Fatty Acids) .... 100mg  Once Daily 9)  Lutein 20 Mg Tabs (Lutein) .[name].. 1 By Mouth Once Daily 10)  Fosrenol 1000 Mg Chew (Lanthanum Carbonate) .[name].. 1 Taq Four Times A Day 11)  Lumigan 0.03 % Soln (Bimatoprost) .[name].. 1 Gtt Once Daily 12)  Dilantin 100 Mg Caps (Phenytoin Sodium Extended) .[name].. 1 By Mouth Q8 13)  Proamatine 5 Mg Tabs (Midodrine Hcl) .... Take One By Mouth Before Dialysis and If Bp< 100 14)  Lisinopril 5 Mg Tabs (Lisinopril) .... Take One Tablet By Mouth Daily 15)  Zyrtec Allergy 10 Mg Tabs (Cetirizine Hcl) .... One By Mouth Daily  Allergies: 1)  ! * Protamine 2)  ! * Dialudid 3)  ! * Avelox 4)  ! Cipro (Ciprofloxacin) 5)  ! Rocephin  Past History:  Past Medical  History: Last updated: 06/14/2008 HYPERPARATHYROIDISM, HX OF (ICD-V12.2) HYPOTHYROIDISM (ICD-244.9) HYPERTENSION, UNSPECIFIED (ICD-401.9) ICD - IN SITU (ICD-V45.02) TACHYCARDIA (ICD-785)    Past Surgical History: Last updated: 06/14/2008 ICD - St Jude -- 11/05  Review of Systems       The patient complains of dyspnea on exertion.  The patient denies chest pain, syncope, and peripheral edema.    Vital Signs:  Patient profile:   50 year old male Height:      70 inches Weight:      143 pounds BMI:     20.59 Pulse rate:   85 / minute BP sitting:   110 / 60  (left arm)  Vitals Entered By: Laurance Flatten CMA (December 19, 2009 4:20 PM)  Physical Exam  General:  Chronicallly ill appearing, NAD. Head:  normocephalic and atraumatic Eyes:  PERRLA/EOM intact; conjunctiva and lids normal. Mouth:  Teeth, gums and palate normal. Oral mucosa normal. Neck:  Neck supple, no JVD. No masses, thyromegaly or abnormal cervical nodes. Chest Wall:  Well healed ICD incision. Lungs:  Clear bilaterally to auscultation with no wheezes or rhonchi. Heart:  RRR with normal S1 and S2.  PMI is enlarged and laterally displaced.  A soft systolic murmur is present at the RUSB. Abdomen:  Bowel sounds positive; abdomen  soft and non-tender without masses, organomegaly, or hernias noted. No hepatosplenomegaly. Msk:  Back normal, normal gait. Muscle strength and tone normal. Pulses:  pulses normal in all 4 extremities Extremities:  No clubbing or cyanosis. No edema. Neurologic:  Alert and oriented x 3.    ICD Specifications Following MD:  Lewayne Bunting, MD     ICD Vendor:  [facility] Jude     ICD Model Number:  [phone]     ICD Serial Number:  [id number] ICD DOI:  08/29/2009     ICD Implanting MD:  Lewayne Bunting, MD  Lead 1:    Location: RA     DOI: 01/11/2004     Model #: 1488TC     Serial #: [id number]     Status: active Lead 2:    Location: RV     DOI: 01/11/2004     Model #: 1580     Serial #: [id number]     Status:  active  Indications::  SMVT  Explantation Comments: 08/29/09 St. Jude Atlas [id number] explanted.  ICD Follow Up Remote Check?  No Charge Time:  8.3 seconds     Battery Est. Longevity:  2.3 years Underlying rhythm:  SR ICD Dependent:  No       ICD Device Measurements Atrium:  Amplitude: 3.2 mV, Impedance: 460 ohms, Threshold: 0.75 V at 0.5 msec Right Ventricle:  Amplitude: 11.9 mV, Impedance: 280 ohms, Threshold: 4.0 V at 1.5 msec Left Ventricle:  Impedance: 790 ohms, Threshold: 0.75 V at 0.5 msec Shock Impedance: 34 ohms   Episodes MS Episodes:  12     Percent Mode Switch:  1.5%     Coumadin:  No Shock:  0     ATP:  0     Nonsustained:  0     Atrial Pacing:  <1%     Ventricular Pacing:  96%  Brady Parameters Mode DDD     Lower Rate Limit:  60     Upper Rate Limit 110 PAV 200     Sensed AV Delay:  180  Tachy Zones VF:  240     VT:  214     VT1:  174     Next Remote Date:  03/22/2010     Next Cardiology Appt Due:  09/02/2010 Tech Comments:  RV reprogrammed for high threshold.  Autocapture on for LV.  PVARP reprogrammed for PMT episodes.  Merlin transmissions every 3 months.  ROV 7/12 with Dr. Ladona Ridgel. Altha Harm, LPN  December 19, 2009 4:38 PM  MD Comments:  Agree with above.  Impression & Recommendations:  Problem # 1:  CHRONIC SYSTOLIC HEART FAILURE (ICD-428.22) His symptoms remain class 2-3.  He is instructed to maintain a low sodium diet. His updated medication list for this problem includes:    Lisinopril 5 Mg Tabs (Lisinopril) .[name]... Take one tablet by mouth daily  Problem # 2:  ICD - IN SITU (ICD-V45.02) HIs device appears to have healed nicely and is working normally.  Will follow.  Patient Instructions: 1)  Your physician wants you to follow-up in:  6 months with Dr Court Joy will receive a reminder letter in the mail two months in advance. If you don't receive a letter, please call our office to schedule the follow-up appointment.

## 2010-04-03 NOTE — Letter (Signed)
Summary: Implantable Device Instructions  Architectural technologist, Main Office  [address]   [place], [place] [address]   Phone: [phone]  Fax: [phone]      Implantable Device Instructions  You are scheduled for:  Bi-V ICD upgarde  on 08/29/09 with Dr. Ladona Ridgel.  1.  Please arrive at the Short Stay Center at [facility] at 5:30am on the day of your procedure.  2.  Do not eat or drink the night before your procedure.  3.  Complete lab work on 08/21/09 at dialysis   You do not have to be fasting.  4.  Plan for an overnight stay.  Bring your insurance cards and a list of your medications. You will most likely go home that day  6.  Wash your chest and neck with antibacterial soap (any brand) the evening before and the morning of your procedure.  Rinse well.  *If you have ANY questions after you get home, please call the office [phone]. Anselm Pancoast  *Every attempt is made to prevent procedures from being rescheduled.  Due to the nauture of Electrophysiology, rescheduling can happen.  The physician is always aware and directs the staff when this occurs.

## 2010-04-03 NOTE — Procedures (Signed)
Summary: [facility]   Current Medications (verified): 1)  Prograf 1 Mg Caps (Tacrolimus) .... 2 Capsules Two Times A Day 2)  Prednisone 5 Mg Tabs (Prednisone) .[name].. 1 By Mouth Once Daily 3)  Synthroid 200 Mcg Tabs (Levothyroxine Sodium) .... Once Daily 4)  Nephro-Vite Rx 1 Mg Tabs (B Complex-C-Folic Acid) .[name].. 1 Ppo Once Daily 5)  Crestor 5 Mg Tabs (Rosuvastatin Calcium) .... Take One Tablet By Mouth Daily. 6)  Nizatidine 150 Mg Caps (Nizatidine) .[name].. 1 By Mouth Once Daily 7)  Azathioprine 50 Mg Tabs (Azathioprine) .... Take 1 and 1/2 Tab Once Daily 8)  Salmon Oil-1000 200 Mg Caps (Omega-3 Fatty Acids) .... 100mg  Once Daily 9)  Lutein 20 Mg Tabs (Lutein) .[name].. 1 By Mouth Once Daily 10)  Fosrenol 1000 Mg Chew (Lanthanum Carbonate) .[name].. 1 Taq Four Times A Day 11)  Lumigan 0.03 % Soln (Bimatoprost) .[name].. 1 Gtt Once Daily 12)  Dilantin 100 Mg Caps (Phenytoin Sodium Extended) .[name].. 1 By Mouth Q8 13)  Proamatine 5 Mg Tabs (Midodrine Hcl) .... Take One By Mouth Before Dialysis and If Bp< 100 14)  Crestor 10 Mg Tabs (Rosuvastatin Calcium) .... Take 1/2tablet By Mouth Every Other Day 15)  Lisinopril 5 Mg Tabs (Lisinopril) .... Take One Tablet By Mouth Daily 16)  Zyrtec Allergy 10 Mg Tabs (Cetirizine Hcl) .... One By Mouth Daily 17)  Septra Ds 800-160 Mg Tabs (Sulfamethoxazole-Trimethoprim) .... One By Mouth Daily Until Finished  Allergies (verified): 1)  ! * Protamine 2)  ! * Dialudid 3)  ! * Avelox 4)  ! Cipro (Ciprofloxacin) 5)  ! Rocephin   ICD Specifications Following MD:  Lewayne Bunting, MD     ICD Vendor:  [facility] Jude     ICD Model Number:  [phone]     ICD Serial Number:  [id number] ICD DOI:  08/29/2009     ICD Implanting MD:  Lewayne Bunting, MD  Lead 1:    Location: RA     DOI: 01/11/2004     Model #: 1488TC     Serial #: [id number]     Status: active Lead 2:    Location: RV     DOI: 01/11/2004     Model #: 1580     Serial #: [id number]     Status: active  Indications::  SMVT  Explantation Comments: 08/29/09 St.  Jude Atlas [id number] explanted.  ICD Follow Up Remote Check?  No Charge Time:  8.3 seconds     Battery Est. Longevity:  3.3 years ICD Dependent:  No       ICD Device Measurements Atrium:  Amplitude: 2.2 mV, Impedance: 330 ohms, Threshold: 0.5 V at 0.5 msec Right Ventricle:  Amplitude: 9.1 mV, Impedance: 250 ohms, Threshold: 3.5 V at 0.8 msec Shock Impedance: 32 ohms   Episodes MS Episodes:  0     Percent Mode Switch:  0     Coumadin:  No Shock:  0     ATP:  0     Nonsustained:  0     Atrial Pacing:  <1%     Ventricular Pacing:  92%  Brady Parameters Mode DDD     Lower Rate Limit:  60     Upper Rate Limit 110 PAV 200     Sensed AV Delay:  180  Tachy Zones VF:  240     VT:  214     VT1:  174     Next Cardiology Appt Due:  12/02/2009 Tech Comments:  No parameter changes. Steri strips removed, no redness or edema noted.  Mr. Mccaughey c/o cough from his Lisinipril after discussing this with Dr. Ladona Ridgel  he will stop it and Dr. Lizbeth Bark get back with him as to what medication to take as an alternative.    ROV 3 months with Dr. Ladona Ridgel. Altha Harm, LPN  September 14, 2009 12:36 PM

## 2010-04-03 NOTE — Letter (Signed)
Summary: [facility] Note - Cardiovascular   [facility] Note - Cardiovascular   Imported By: Roderic Ovens 10/13/2009 14:41:28  _____________________________________________________________________  External Attachment:    Type:   Image     Comment:   External Document

## 2010-04-03 NOTE — Progress Notes (Signed)
Summary: medication question  Phone Note Call from Patient Call back at [phone]   Caller: Patient Summary of Call: Pt have question about medication Initial call taken by: Judie Grieve,  February 07, 2010 9:19 AM  Follow-up for Phone Call        lmom for pt to call back Dennis Bast, RN, BSN  February 07, 2010 3:20 PM  pt calling back-pls call [phone] Glynda Jaeger  February 07, 2010 3:27 PM Pt called c/o his BP dropping during dialysis and his is going to adjust his Lisinopril on his dialysis days   Per Dr Ladona Ridgel slow dialysis down Dennis Bast, RN, BSN  February 07, 2010 4:47 PM

## 2010-04-03 NOTE — Cardiovascular Report (Signed)
Summary: Office Visit   Office Visit   Imported By: Roderic Ovens 12/21/2009 13:33:27  _____________________________________________________________________  External Attachment:    Type:   Image     Comment:   External Document

## 2010-04-03 NOTE — Progress Notes (Signed)
Summary: medication question  Phone Note Call from Patient Call back at Home Phone [phone]   Caller: Patient Reason for Call: Talk to Nurse, Talk to Doctor Summary of Call: pt was told by hemo that he should take his antibiotic twice a day but Dr. Ladona Ridgel said three times a day and he wants to know which he should do Initial call taken by: Omer Jack,  August 30, 2009 4:45 PM  Follow-up for Phone Call        If they say two times a day then take it that way per Dr Ladona Ridgel It is okay.  pt aware Dennis Bast, RN, BSN  August 30, 2009 5:29 PM

## 2010-04-03 NOTE — Cardiovascular Report (Signed)
Summary: Pre-Op Orders  Pre-Op Orders   Imported By: Marylou Mccoy 09/18/2009 16:19:58  _____________________________________________________________________  External Attachment:    Type:   Image     Comment:   External Document

## 2010-04-03 NOTE — Progress Notes (Signed)
Summary: FYI  Phone Note From Other Clinic Call back at [phone]   Summary of Call: Per Dr Caryn Section pt is to have pacemaker upgrade. 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 Dr Caryn Section wanted Dr Ladona Ridgel to know. [phone] Initial call taken by: Edman Circle,  August 14, 2009 10:39 AM  Follow-up for Phone Call        Dr Ladona Ridgel aware Dennis Bast, RN, BSN  August 15, 2009 11:40 AM

## 2010-04-03 NOTE — Letter (Signed)
Summary: [facility] Note  [facility] Note   Imported By: Roderic Ovens 07/17/2009 10:30:18  _____________________________________________________________________  External Attachment:    Type:   Image     Comment:   External Document

## 2010-04-03 NOTE — Cardiovascular Report (Signed)
Summary: Office Visit Remote   Office Visit Remote   Imported By: Roderic Ovens 03/22/2009 12:00:40  _____________________________________________________________________  External Attachment:    Type:   Image     Comment:   External Document

## 2010-04-05 NOTE — Letter (Signed)
Summary: Device-Delinquent Phone Journalist, newspaper, Main Office  [address]   [place], [place] [address]   Phone: [phone]  Fax: [phone]     March 23, 2010 MRN: [id number]   [name] [address] [place], [place]  [address]   Dear [name],  According to our records, you were scheduled for a device phone transmission on 03-22-2010.     We did not receive any results from this check.  If you transmitted on your scheduled day, please call us to help troubleshoot your system.  If you forgot to send your transmission, please send one upon receipt of this letter.  Thank you,   Arch[facility]

## 2010-04-05 NOTE — Progress Notes (Signed)
Summary: Calling about medication/2nd call  Phone Note Call from Patient Call back at [phone]   Caller: Patient Summary of Call: Pt calling about medications. Initial call taken by: Judie Grieve,  March 21, 2010 2:57 PM  Follow-up for Phone Call        pt calling re changing meds. pt states he called and no one has called him back. phone # [phone] Follow-up by: Roe Coombs,  March 23, 2010 10:22 AM  Additional Follow-up for Phone Call Additional follow up Details #1::        Dr Lacy Duverney at [facility] changed his Lisinopril to Losartan 25mg    Just FYI Anselm Pancoast I agree with this switch. Additional Follow-up by: Laren Boom, MD, [facility],  March 30, 2010 3:36 PM

## 2010-04-06 NOTE — Letter (Signed)
Summary: [facility]  [facility]   Imported By: Marylou Mccoy 12/13/2009 13:18:03  _____________________________________________________________________  External Attachment:    Type:   Image     Comment:   External Document

## 2010-04-11 NOTE — Miscellaneous (Signed)
Summary: CORRECTED DEVICE INFORMATION  Clinical Lists Changes  Observations: Added new observation of ICDLEADSTAT3: active (04/02/2010 20:09) Added new observation of ICDLEADSER3: [id number] (04/02/2010 20:09) Added new observation of ICDLEADMOD3: 1258T (04/02/2010 20:09) Added new observation of ICDLEADDOI3: 11/24/2009 (04/02/2010 20:09) Added new observation of ICDLEADLOC3: LV (04/02/2010 20:09)       ICD Specifications Following MD:  Lewayne Bunting, MD     ICD Vendor:  [employer] Jude     ICD Model Number:  [id number]     ICD Serial Number:  [id number] ICD DOI:  08/29/2009     ICD Implanting MD:  Lewayne Bunting, MD  Lead 1:    Location: RA     DOI: 01/11/2004     Model #: [id number]     Serial #: [id number]     Status: active Lead 2:    Location: RV     DOI: 01/11/2004     Model #: 1580     Serial #: [id number]     Status: active Lead 3:    Location: LV     DOI: 11/24/2009     Model #: [id number]     Serial #: [id number]     Status: active  Indications::  SMVT  Explantation Comments: 08/29/09 [employer] Jude Atlas [id number] explanted.  ICD Follow Up ICD Dependent:  No      Episodes Coumadin:  No  Brady Parameters Mode DDD     Lower Rate Limit:  60     Upper Rate Limit 110 PAV 200     Sensed AV Delay:  180  Tachy Zones VF:  240     VT:  214     VT1:  174

## 2010-05-20 LAB — BASIC METABOLIC PANEL
CO2: 35 mEq/L — ABNORMAL HIGH (ref 19–32)
Chloride: 94 mEq/L — ABNORMAL LOW (ref 96–112)
GFR calc Af Amer: 17 mL/min — ABNORMAL LOW (ref >60)
Potassium: 3.9 mEq/L (ref 3.5–5.1)

## 2010-05-20 LAB — DIFFERENTIAL
Basophils Relative: 1 % (ref 0–1)
Eosinophils Absolute: 0.2 10*3/uL (ref 0.0–0.7)
Eosinophils Relative: 4 % (ref 0–5)
Lymphs Abs: 0.5 10*3/uL — ABNORMAL LOW (ref 0.7–4.0)
Monocytes Absolute: 0.5 10*3/uL (ref 0.1–1.0)
Monocytes Relative: 8 % (ref 3–12)

## 2010-05-20 LAB — CBC
HCT: 34.8 % — ABNORMAL LOW (ref 39.0–52.0)
Hemoglobin: 11.7 g/dL — ABNORMAL LOW (ref 13.0–17.0)
MCHC: 33.5 g/dL (ref 30.0–36.0)
MCV: 100.9 fL — ABNORMAL HIGH (ref 78.0–100.0)
RBC: 3.45 MIL/uL — ABNORMAL LOW (ref 4.22–5.81)
WBC: 5.8 10*3/uL (ref 4.0–10.5)

## 2010-05-21 LAB — BASIC METABOLIC PANEL
BUN: 32 mg/dL — ABNORMAL HIGH (ref 6–23)
BUN: 33 mg/dL — ABNORMAL HIGH (ref 6–23)
CO2: 31 mEq/L (ref 19–32)
CO2: 33 mEq/L — ABNORMAL HIGH (ref 19–32)
Chloride: 98 mEq/L (ref 96–112)
Chloride: 98 mEq/L (ref 96–112)
Creatinine, Ser: 5.7 mg/dL — ABNORMAL HIGH (ref 0.4–1.5)
GFR calc Af Amer: 13 mL/min — ABNORMAL LOW (ref >60)
Glucose, Bld: 89 mg/dL (ref 70–99)
Potassium: 4.6 mEq/L (ref 3.5–5.1)

## 2010-05-21 LAB — CBC
HCT: 35.5 % — ABNORMAL LOW (ref 39.0–52.0)
MCHC: 34.2 g/dL (ref 30.0–36.0)
MCV: 100.9 fL — ABNORMAL HIGH (ref 78.0–100.0)
Platelets: 169 10*3/uL (ref 150–400)
WBC: 4.8 10*3/uL (ref 4.0–10.5)

## 2010-05-21 LAB — DIFFERENTIAL
Eosinophils Absolute: 0.3 10*3/uL (ref 0.0–0.7)
Eosinophils Relative: 6 % — ABNORMAL HIGH (ref 0–5)
Lymphs Abs: 1 10*3/uL (ref 0.7–4.0)
Monocytes Relative: 8 % (ref 3–12)

## 2010-05-21 LAB — SURGICAL PCR SCREEN: MRSA, PCR: NEGATIVE

## 2010-05-28 LAB — DIFFERENTIAL
Basophils Absolute: 0 10*3/uL (ref 0.0–0.1)
Basophils Relative: 0 % (ref 0–1)
Neutro Abs: 8.4 10*3/uL — ABNORMAL HIGH (ref 1.7–7.7)
Neutrophils Relative %: 85 % — ABNORMAL HIGH (ref 43–77)

## 2010-05-28 LAB — BASIC METABOLIC PANEL
CO2: 31 mEq/L (ref 19–32)
Calcium: 9.6 mg/dL (ref 8.4–10.5)
Creatinine, Ser: 7.63 mg/dL — ABNORMAL HIGH (ref 0.4–1.5)
Glucose, Bld: 99 mg/dL (ref 70–99)

## 2010-05-28 LAB — CBC
MCHC: 33.1 g/dL (ref 30.0–36.0)
Platelets: 151 10*3/uL (ref 150–400)
RDW: 18.2 % — ABNORMAL HIGH (ref 11.5–15.5)

## 2010-06-07 LAB — DIFFERENTIAL
Basophils Relative: 1 % (ref 0–1)
Eosinophils Relative: 2 % (ref 0–5)
Lymphocytes Relative: 4 % — ABNORMAL LOW (ref 12–46)
Lymphocytes Relative: 8 % — ABNORMAL LOW (ref 12–46)
Lymphs Abs: 0.3 10*3/uL — ABNORMAL LOW (ref 0.7–4.0)
Lymphs Abs: 0.4 10*3/uL — ABNORMAL LOW (ref 0.7–4.0)
Monocytes Absolute: 0.4 10*3/uL (ref 0.1–1.0)
Monocytes Relative: 8 % (ref 3–12)
Neutro Abs: 4 10*3/uL (ref 1.7–7.7)
Neutro Abs: 6.4 10*3/uL (ref 1.7–7.7)
Neutrophils Relative %: 83 % — ABNORMAL HIGH (ref 43–77)

## 2010-06-07 LAB — CLOSTRIDIUM DIFFICILE EIA
C difficile Toxins A+B, EIA: NEGATIVE
C difficile Toxins A+B, EIA: NEGATIVE
C difficile Toxins A+B, EIA: NEGATIVE

## 2010-06-07 LAB — HEMOGLOBIN A1C
Hgb A1c MFr Bld: 5 % (ref 4.6–6.1)
Mean Plasma Glucose: 97 mg/dL

## 2010-06-07 LAB — RENAL FUNCTION PANEL
Albumin: 2.5 g/dL — ABNORMAL LOW (ref 3.5–5.2)
BUN: 46 mg/dL — ABNORMAL HIGH (ref 6–23)
CO2: 18 mEq/L — ABNORMAL LOW (ref 19–32)
Calcium: 8.6 mg/dL (ref 8.4–10.5)
Calcium: 8.8 mg/dL (ref 8.4–10.5)
Creatinine, Ser: 10.13 mg/dL — ABNORMAL HIGH (ref 0.4–1.5)
Creatinine, Ser: 8.12 mg/dL — ABNORMAL HIGH (ref 0.4–1.5)
GFR calc Af Amer: 9 mL/min — ABNORMAL LOW (ref >60)
GFR calc non Af Amer: 7 mL/min — ABNORMAL LOW (ref >60)
Glucose, Bld: 94 mg/dL (ref 70–99)
Phosphorus: 4.1 mg/dL (ref 2.3–4.6)
Sodium: 135 mEq/L (ref 135–145)

## 2010-06-07 LAB — CULTURE, BLOOD (ROUTINE X 2)
Culture: NO GROWTH
Culture: NO GROWTH

## 2010-06-07 LAB — GLUCOSE, CAPILLARY
Glucose-Capillary: 113 mg/dL — ABNORMAL HIGH (ref 70–99)
Glucose-Capillary: 77 mg/dL (ref 70–99)

## 2010-06-07 LAB — BASIC METABOLIC PANEL
BUN: 18 mg/dL (ref 6–23)
BUN: 21 mg/dL (ref 6–23)
BUN: 33 mg/dL — ABNORMAL HIGH (ref 6–23)
BUN: 33 mg/dL — ABNORMAL HIGH (ref 6–23)
BUN: 83 mg/dL — ABNORMAL HIGH (ref 6–23)
CO2: 19 mEq/L (ref 19–32)
Calcium: 8.7 mg/dL (ref 8.4–10.5)
Calcium: 8.7 mg/dL (ref 8.4–10.5)
Calcium: 9.1 mg/dL (ref 8.4–10.5)
Chloride: 102 mEq/L (ref 96–112)
Chloride: 104 mEq/L (ref 96–112)
Chloride: 106 mEq/L (ref 96–112)
Creatinine, Ser: 14.68 mg/dL — ABNORMAL HIGH (ref 0.4–1.5)
Creatinine, Ser: 5.59 mg/dL — ABNORMAL HIGH (ref 0.4–1.5)
Creatinine, Ser: 6.48 mg/dL — ABNORMAL HIGH (ref 0.4–1.5)
Creatinine, Ser: 7.9 mg/dL — ABNORMAL HIGH (ref 0.4–1.5)
Creatinine, Ser: 8.48 mg/dL — ABNORMAL HIGH (ref 0.4–1.5)
GFR calc Af Amer: 13 mL/min — ABNORMAL LOW (ref >60)
GFR calc Af Amer: 4 mL/min — ABNORMAL LOW (ref >60)
GFR calc Af Amer: 9 mL/min — ABNORMAL LOW (ref >60)
GFR calc non Af Amer: 11 mL/min — ABNORMAL LOW (ref >60)
GFR calc non Af Amer: 9 mL/min — ABNORMAL LOW (ref >60)
Glucose, Bld: 93 mg/dL (ref 70–99)
Glucose, Bld: 94 mg/dL (ref 70–99)
Potassium: 3.3 mEq/L — ABNORMAL LOW (ref 3.5–5.1)

## 2010-06-07 LAB — CBC
HCT: 37.3 % — ABNORMAL LOW (ref 39.0–52.0)
HCT: 42.9 % (ref 39.0–52.0)
Hemoglobin: 12.7 g/dL — ABNORMAL LOW (ref 13.0–17.0)
MCHC: 33.3 g/dL (ref 30.0–36.0)
MCHC: 33.7 g/dL (ref 30.0–36.0)
MCHC: 34 g/dL (ref 30.0–36.0)
MCHC: 34 g/dL (ref 30.0–36.0)
MCV: 100.5 fL — ABNORMAL HIGH (ref 78.0–100.0)
MCV: 100.9 fL — ABNORMAL HIGH (ref 78.0–100.0)
MCV: 100.9 fL — ABNORMAL HIGH (ref 78.0–100.0)
MCV: 103.3 fL — ABNORMAL HIGH (ref 78.0–100.0)
Platelets: 101 10*3/uL — ABNORMAL LOW (ref 150–400)
Platelets: 110 10*3/uL — ABNORMAL LOW (ref 150–400)
Platelets: 119 10*3/uL — ABNORMAL LOW (ref 150–400)
Platelets: 123 10*3/uL — ABNORMAL LOW (ref 150–400)
Platelets: 127 10*3/uL — ABNORMAL LOW (ref 150–400)
RBC: 3.26 MIL/uL — ABNORMAL LOW (ref 4.22–5.81)
RBC: 3.88 MIL/uL — ABNORMAL LOW (ref 4.22–5.81)
RBC: 3.92 MIL/uL — ABNORMAL LOW (ref 4.22–5.81)
RDW: 15.2 % (ref 11.5–15.5)
RDW: 15.3 % (ref 11.5–15.5)
WBC: 2.6 10*3/uL — ABNORMAL LOW (ref 4.0–10.5)
WBC: 3.7 10*3/uL — ABNORMAL LOW (ref 4.0–10.5)

## 2010-06-07 LAB — POCT I-STAT 3, ART BLOOD GAS (G3+)
Bicarbonate: 24.6 mEq/L — ABNORMAL HIGH (ref 20.0–24.0)
pCO2 arterial: 37.6 mmHg (ref 35.0–45.0)
pH, Arterial: 7.424 (ref 7.350–7.450)
pO2, Arterial: 69 mmHg — ABNORMAL LOW (ref 80.0–100.0)

## 2010-06-07 LAB — LIPID PANEL
HDL: 59 mg/dL (ref >39)
Total CHOL/HDL Ratio: 2.1 RATIO
VLDL: 18 mg/dL (ref 0–40)

## 2010-06-07 LAB — POCT CARDIAC MARKERS: Myoglobin, poc: >500 ng/mL (ref 12–200)

## 2010-06-07 LAB — COMPREHENSIVE METABOLIC PANEL
AST: 29 U/L (ref 0–37)
Albumin: 3.1 g/dL — ABNORMAL LOW (ref 3.5–5.2)
Alkaline Phosphatase: 124 U/L — ABNORMAL HIGH (ref 39–117)
BUN: 82 mg/dL — ABNORMAL HIGH (ref 6–23)
CO2: 28 mEq/L (ref 19–32)
Calcium: 9.6 mg/dL (ref 8.4–10.5)
Calcium: 9.6 mg/dL (ref 8.4–10.5)
Creatinine, Ser: 7.72 mg/dL — ABNORMAL HIGH (ref 0.4–1.5)
GFR calc Af Amer: 9 mL/min — ABNORMAL LOW (ref >60)
GFR calc non Af Amer: 7 mL/min — ABNORMAL LOW (ref >60)
Potassium: 4.1 mEq/L (ref 3.5–5.1)
Sodium: 135 mEq/L (ref 135–145)
Total Protein: 7.1 g/dL (ref 6.0–8.3)

## 2010-06-07 LAB — GIARDIA/CRYPTOSPORIDIUM SCREEN(EIA)
Cryptosporidium Screen (EIA): NEGATIVE
Giardia Screen - EIA: NEGATIVE

## 2010-06-07 LAB — FECAL LACTOFERRIN, QUANT: Fecal Lactoferrin: POSITIVE

## 2010-06-07 LAB — LIPASE, BLOOD: Lipase: 76 U/L — ABNORMAL HIGH (ref 11–59)

## 2010-06-07 LAB — PHENYTOIN LEVEL, TOTAL: Phenytoin Lvl: 2.8 ug/mL — ABNORMAL LOW (ref 10.0–20.0)

## 2010-06-07 LAB — STOOL CULTURE

## 2010-06-09 LAB — POCT I-STAT, CHEM 8
Creatinine, Ser: 6.4 mg/dL — ABNORMAL HIGH (ref 0.4–1.5)
HCT: 40 % (ref 39.0–52.0)
Hemoglobin: 13.6 g/dL (ref 13.0–17.0)
Potassium: 4.2 mEq/L (ref 3.5–5.1)
Sodium: 137 mEq/L (ref 135–145)
TCO2: 30 mmol/L (ref 0–100)

## 2010-06-14 LAB — BASIC METABOLIC PANEL
Calcium: 9.5 mg/dL (ref 8.4–10.5)
GFR calc Af Amer: 19 mL/min — ABNORMAL LOW (ref >60)
GFR calc non Af Amer: 16 mL/min — ABNORMAL LOW (ref >60)
Glucose, Bld: 92 mg/dL (ref 70–99)
Potassium: 4 mEq/L (ref 3.5–5.1)
Sodium: 140 mEq/L (ref 135–145)

## 2010-06-14 LAB — CBC
HCT: 43 % (ref 39.0–52.0)
Hemoglobin: 14.4 g/dL (ref 13.0–17.0)
RDW: 16.3 % — ABNORMAL HIGH (ref 11.5–15.5)
WBC: 5.6 10*3/uL (ref 4.0–10.5)

## 2010-06-19 LAB — DIFFERENTIAL
Eosinophils Absolute: 0.3 10*3/uL (ref 0.0–0.7)
Eosinophils Relative: 7 % — ABNORMAL HIGH (ref 0–5)
Lymphocytes Relative: 11 % — ABNORMAL LOW (ref 12–46)
Lymphs Abs: 0.5 10*3/uL — ABNORMAL LOW (ref 0.7–4.0)
Monocytes Absolute: 0.4 10*3/uL (ref 0.1–1.0)

## 2010-06-19 LAB — CBC
HCT: 38.7 % — ABNORMAL LOW (ref 39.0–52.0)
Hemoglobin: 13.1 g/dL (ref 13.0–17.0)
MCV: 95.6 fL (ref 78.0–100.0)
RDW: 16.8 % — ABNORMAL HIGH (ref 11.5–15.5)
WBC: 4.7 10*3/uL (ref 4.0–10.5)

## 2010-06-19 LAB — BASIC METABOLIC PANEL
BUN: 50 mg/dL — ABNORMAL HIGH (ref 6–23)
Calcium: 8.7 mg/dL (ref 8.4–10.5)
Chloride: 103 mEq/L (ref 96–112)
GFR calc Af Amer: 10 mL/min — ABNORMAL LOW (ref >60)
GFR calc non Af Amer: 10 mL/min — ABNORMAL LOW (ref >60)
GFR calc non Af Amer: 9 mL/min — ABNORMAL LOW (ref >60)
Glucose, Bld: 91 mg/dL (ref 70–99)
Potassium: 3.9 mEq/L (ref 3.5–5.1)
Potassium: 4.3 mEq/L (ref 3.5–5.1)
Sodium: 138 mEq/L (ref 135–145)
Sodium: 141 mEq/L (ref 135–145)

## 2010-06-19 LAB — MAGNESIUM: Magnesium: 2.5 mg/dL (ref 1.5–2.5)

## 2010-06-19 LAB — CK TOTAL AND CKMB (NOT AT ARMC)
CK, MB: 2.4 ng/mL (ref 0.3–4.0)
Relative Index: 1.9 (ref 0.0–2.5)

## 2010-06-19 LAB — CARDIAC PANEL(CRET KIN+CKTOT+MB+TROPI): Troponin I: 0.02 ng/mL (ref 0.00–0.06)

## 2010-06-19 LAB — TSH: TSH: 1.677 u[IU]/mL (ref 0.350–4.500)

## 2010-06-29 ENCOUNTER — Encounter: Payer: Self-pay | Admitting: Internal Medicine

## 2010-07-03 ENCOUNTER — Ambulatory Visit (INDEPENDENT_AMBULATORY_CARE_PROVIDER_SITE_OTHER): Payer: Medicare Other | Admitting: Internal Medicine

## 2010-07-03 ENCOUNTER — Encounter: Payer: Self-pay | Admitting: Internal Medicine

## 2010-07-03 DIAGNOSIS — Z95810 Presence of automatic (implantable) cardiac defibrillator: Secondary | ICD-10-CM

## 2010-07-03 DIAGNOSIS — R0989 Other specified symptoms and signs involving the circulatory and respiratory systems: Secondary | ICD-10-CM

## 2010-07-03 DIAGNOSIS — I5022 Chronic systolic (congestive) heart failure: Secondary | ICD-10-CM

## 2010-07-03 DIAGNOSIS — I428 Other cardiomyopathies: Secondary | ICD-10-CM

## 2010-07-03 NOTE — Assessment & Plan Note (Addendum)
His device is working normally except that his RV lead threshold is elevated. Will continue to follow in several months.

## 2010-07-03 NOTE — Progress Notes (Signed)
HPI   [name] returns today for followup. He is a pleasant middle aged man with a h/o VT and a DCM and ESRD on HD. He continues to experience problems with CHF though lately he has been mostly class2. He has had no recent ICD therapies. He underwent insertion of a new LV lead at [facility] several months ago and has done well. This LV lead was placed from the left subclavian vein (previously occluded) and tunneled over to the right side. His CHF is class 2 and he has LBBB with an EF 25%. He denies c/p. His dyspnea is class 2 and may be a bit better since his LV lead was placed.  Allergies  Allergen Reactions  . Ceftriaxone Sodium   . Ciprofloxacin   . Moxifloxacin      Current Outpatient Prescriptions  Medication Sig Dispense Refill  . azaTHIOprine (IMURAN) 50 MG tablet Take 1 and a half tablets once daily.       . B Complex-C-Folic Acid (B COMPLEX-VITAMIN C-FOLIC ACID) 1 MG tablet Take 1 tablet by mouth daily.        . bimatoprost (LUMIGAN) 0.03 % ophthalmic drops 1 gtt once daily       . cetirizine (ZYRTEC) 10 MG tablet Take 10 mg by mouth daily.        [name] lanthanum (FOSRENOL) 1000 MG chewable tablet 1 tab four times a day       . levothyroxine (SYNTHROID, LEVOTHROID) 200 MCG tablet Take 200 mcg by mouth daily.        [name] losartan (COZAAR) 25 MG tablet Take 25 mg by mouth daily.        . Lutein 20 MG TABS Take 1 tablet by mouth daily.        . midodrine (PROAMATINE) 5 MG tablet Take one by mouth before dialysis and if BP < 100       . nizatidine (AXID) 150 MG capsule Take 150 mg by mouth daily.        . Omega-3 Fatty Acids (SALMON OIL) 200 MG CAPS 100mg  once daily       . phenytoin (DILANTIN) 100 MG ER capsule Take by mouth 3 (three) times daily.        . predniSONE (DELTASONE) 5 MG tablet Take 5 mg by mouth daily.        . rosuvastatin (CRESTOR) 5 MG tablet Take one tablet by mouth every other day.       . tacrolimus (PROGRAF) 1 MG capsule 2 capsules, two times a day       . DISCONTD:  lisinopril (PRINIVIL,ZESTRIL) 5 MG tablet Take 5 mg by mouth daily.           Past Medical History  Diagnosis Date  . Hyperparathyroidism   . Hypothyroidism   . Automatic implantable cardiac defibrillator in situ   . Tachycardia   . Hypertension     ROS:   All systems reviewed and negative except as noted in the HPI.   Past Surgical History  Procedure Date  . Cardiac defibrillator placement 11/05    St Jude     Family History  Problem Relation Age of Onset  . Hypertension    . Cancer    . Coronary artery disease       History   Social History  . Marital Status: Married    Spouse Name: N/A    Number of Children: N/A  . Years of Education: N/A   Occupational History  .  Not on file.   Social History Main Topics  . Smoking status: Unknown If Ever Smoked  . Smokeless tobacco: Not on file  . Alcohol Use: No  . Drug Use: No  . Sexually Active: Not on file   Other Topics Concern  . Not on file   Social History Narrative  . No narrative on file     There were no vitals taken for this visit.  Physical Exam:  Well appearing NAD HEENT: Unremarkable Neck:  No JVD, no thyromegally Lymphatics:  No adenopathy Back:  No CVA tenderness Lungs:  Clear with a well healed ICD pocket. HEART:  Regular rate rhythm, no murmurs, no rubs, no clicks Abd:  Flat, positive bowel sounds, no organomegally, no rebound, no guarding Ext:  2 plus pulses, no edema, no cyanosis, no clubbing Skin:  No rashes no nodules Neuro:  CN II through XII intact, motor grossly intact  DEVICE  Normal device function.  See PaceArt for details.   Assess/Plan:

## 2010-07-03 NOTE — Assessment & Plan Note (Signed)
He is s/p BiV upgrade. His symptoms have improved from class three to class 2.

## 2010-07-03 NOTE — Assessment & Plan Note (Signed)
His VT is well controlled. Will followup in several months. No change in meds.

## 2010-07-17 NOTE — Op Note (Signed)
[name], [name]                ACCOUNT NO.:  [id number]   MEDICAL RECORD NO.:  [id number]          PATIENT TYPE:  AMB   LOCATION:  SDS                          FACILITY:  [facility]   PHYSICIAN:  Janetta Hora. Fields, MD  DATE OF BIRTH:  1960/07/13   DATE OF PROCEDURE:  02/02/2008  DATE OF DISCHARGE:  02/02/2008                               OPERATIVE REPORT   PROCEDURE:  Left thigh arteriovenous graft.   PREOPERATIVE DIAGNOSIS:  End-stage renal disease.   POSTOPERATIVE DIAGNOSIS:  End-stage renal disease.   ANESTHESIA:  General.   ASSISTANT:  Nurse.   FINDINGS:  1. 4-7 mm tapered PTFE graft with proximal anastomosis to superficial      femoral artery.  2. Moderate atherosclerotic changes with thickening of left      superficial femoral artery.   OPERATIVE DETAILS:  After obtaining informed consent, the patient was  taken to the operating room.  The patient was placed in the supine  position on the operating table.  After induction of general anesthesia,  the patient's entire left groin and upper thigh were prepped and draped  in the usual sterile fashion.  An oblique incision was made in the left  groin crease.  Incision was carried down through the subcutaneous  tissues down to the level of the left common femoral artery.  This was  soft in character.  It was dissected free circumferentially.  Dissection  was carried down to the level of the femoral bifurcation.  The  superficial femoral artery was dissected free right at its origin.  It  was also dissected free just below approximately 2 cm to allow enough  room for anastomosis to the left superficial femoral artery.  There was  some posterior plaque in the artery at the superficial femoral artery  origin.  The artery, however, had a good pulse and quality.   Next, the greater saphenous vein was dissected free circumferentially at  the saphenofemoral junction.  Common femoral vein was dissected free on  its anterior  two-third's portion.  Subcutaneous tunnel was then created  in loop configuration on the anterior surface of the left thigh with the  medial portion of the graft for the vein in the lateral portion for the  artery.  A transverse incision was made in the distal thigh for  assistance in tunneling.  The patient was then given 5000 units of  intravenous heparin.  Clamps were used to control the superficial  femoral artery proximally and distally.  A longitudinal opening was made  in the anterior surface of the left superficial femoral artery.  There  was a moderate amount of thickening of the artery.  The graft was  slightly beveled on the 4 mm and sewn end of graft to side of artery  using a running 6-0 Prolene suture.  Just prior to completion of  anastomosis, it was forebled, backbled, and thoroughly flushed.  Anastomosis was secured and there was good pulsatile flow in the graft  immediately after removing the clamps.  There was also good Doppler flow  in the distal superficial femoral artery  below the level of anastomosis.  Next, the graft was pulled taut to length.  The common femoral vein was  controlled with a Cooley clamp.  The greater saphenous vein was ligated  with a 2-0 silk tie just after its origin.  The vein was then opened  longitudinally up on the level of the common femoral artery.  The graft  was beveled and sewn end of graft to side of vein.  The vein was opened  and then the graft sewn end of graft to side of vein using running 6-0  Prolene suture.  Just prior to completion of anastomosis, this was  forebled, backbled, and thoroughly flushed.  Anastomosis was secured.  Clamps were released.  There was palpable thrill along the arterial  aspect of graft immediately.  Next, hemostasis was obtained.  The  patient was also given 50 mg of protamine.  Subcutaneous tissues of both  incisions were closed in multiple layers of running 2-0, 3-0, and 4-0  Vicryl suture and  subcuticular stitch.  The patient tolerated the  procedure well and there were no complications.  Instrument, sponge, and  needle counts were correct at the end of the case.  The patient was  taken to the recovery room in stable condition.      Janetta Hora. Fields, MD  Electronically Signed     CEF/MEDQ  D:  02/02/2008  T:  02/02/2008  Job:  [id number]

## 2010-07-17 NOTE — Op Note (Signed)
[name]                ACCOUNT NO.:  [id number]   MEDICAL RECORD NO.:  [id number]          PATIENT TYPE:  AMB   LOCATION:  DSC                          FACILITY:  [facility]   PHYSICIAN:  Katy Fitch. Sypher, M.D. DATE OF BIRTH:  09-17-1960   DATE OF PROCEDURE:  10/27/2008  DATE OF DISCHARGE:                               OPERATIVE REPORT   PREOPERATIVE DIAGNOSIS:  Complex fungating lesion, left long finger with  a history of prior invasive squamous cell carcinoma treated with  marginal resection in March 2010 with recurrent infection/lytic lesion  involving distal phalanx, rule out recurrent squamous cell carcinoma  versus osteomyelitis/chronic infection following prior resection and  skin grafting, left long finger.   POSTOPERATIVE DIAGNOSIS:  Complex fungating lesion, left long finger  with a history of prior invasive squamous cell carcinoma treated with  marginal resection in March 2010 with recurrent infection/lytic lesion  involving distal phalanx, rule out recurrent squamous cell carcinoma  versus osteomyelitis/chronic infection following prior resection and  skin grafting, left long finger.   OPERATION:  Wide marginal resection of left long finger by proximal  interphalangeal disarticulation with removal of fungating infected mass,  left long finger nail bed with x-ray evidence of lysis of tuft and  diaphysis of distal phalanx.   OPERATING SURGEON:  Katy Fitch. Sypher, MD   ASSISTANT:  Marveen Reeks Dasnoit, PA-C   ANESTHESIA:  Lidocaine 2% metacarpal head level block, a total of 7 mL  supplemented by IV sedation.   SUPERVISING ANESTHESIOLOGIST:  Germaine Pomfret, MD   INDICATIONS:  [name] is a very unfortunate 50 year old gentleman  referred through the courtesy of Dr. Marina Gravel for initial evaluation  and management of a fungating mass beneath his left long nail fold.   [name] was thought to have a fungal infection of his nail but  ultimately developed  a fungating wound that was resected in March 2010  and found to have well-differentiated squamous cell carcinoma.   His nail bed was covered with a full-thickness skin graft harvested from  lateral brachium.   He went on to heal his wound in satisfactory manner.  He was discharged  and was apparently doing well until we are contacted by his renal  doctor, Dr. Caryn Section, reporting that he had either infection or recurrent  squamous cell carcinoma in August 2010.   He was seen on October 24, 2008 and noted to have a fungating mass in his  nail bed that appeared to be infected.  An x-ray of the finger  demonstrated lysis of the tuft of the distal phalanx and the distal  metaphysis and the portion of diaphysis consistent with either  osteomyelitis or recurrent tumor.   Given his history of squamous cell carcinoma and given the absence of  palpable nodes at the epitrochlear region, we recommended proceeding  with wide resection.   At the time of his index surgery we stated that if there was any sign of  a recurrence of his squamous cell carcinoma, we would proceed with  finger amputation.   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   We advised [name] to proceed directly to resection followed by both  antibiotic therapy and meticulous review of his biopsy specimen to be  certain that there is no recurrent squamous cell carcinoma.   Should he have signs of recurrent squamous cell, we will contact the  medical oncology physicians at the [facility] for consult.   After informed consent, [name] is brought to the operating room at  this time.   PROCEDURE IN DETAIL:  [name] was brought to the operating room and  placed in supine position on the operating table.   Preoperatively, he  was interviewed by Dr. Jean Rosenthal and local anesthesia and sedation  accepted.   He was brought to room 5 of the [facility] and placed in  supine position on the operating table.  Under Dr. Edison Pace direct  supervision, sedation was provided.  After Betadine prep, a 2% lidocaine  digital block was meticulously placed by myself, taking care to aspirate  prior to injection.  Total of 7 mL was provided at the level of the  metacarpal head.   Within 10 minutes, a excellent block of the left long finger was  achieved.   The left arm was then prepped with Betadine soap and solution and  sterilely draped.  No tourniquet was applied.   The finger was wrapped over the middle and proximal phalangeal segments  with a gauze wrap followed by light placement of a Esmarch wrap to  provide a digital tourniquet.   When anesthesia was assured, we proceeded to create a fishmouth flaps  based at the level of the PIP joint.  These were taken full-thickness to  bone.  The neurovascular structures were identified and an eye cautery  was used to cauterize the lumens of the digital arteries.  The wound  margins were carefully inspected for bleeding points and no significant  problems were identified.   The PIP joint was disarticulated and head of the proximal phalanx  tailored to a bullet shape with a rongeur.   The Esmarch was released and no problematic bleeding was encountered.   There was bleeding from the subdermal plexus along the wound margins.   The skin was loosely closed with two trauma sutures of 4-0 nylon and  three interrupted sutures of 4-0 nylon.  The finger was dressed with  Adaptic and the hand placed in a compressive wrist-based hand dressing  supporting the long finger dressing quite securely.   The dressing was finished with Coban.   [name] tolerated the surgery and anesthesia well.  There were no  difficulties with his implanted defibrillator and  no arrhythmias noted  during surgery.   He will be provided 1 gram of Ancef as IV prophylactic antibiotic and  placed on Keflex 500 mg 1 p.o. q.8 h. x5 days due to his contaminated  wound.  The specimen was placed in formalin and passed off for  pathologic evaluation.      Katy Fitch Sypher, M.D.  Electronically Signed     RVS/MEDQ  D:  10/27/2008  T:  10/28/2008  Job:  [id number]   cc:   Wilber Bihari. Caryn Section, M.D.

## 2010-07-17 NOTE — Assessment & Plan Note (Signed)
OFFICE VISIT   [name], [name]  DOB:  1960/07/03                                       03/30/2008  ZOXWR#:[id number]   The patient returns for follow-up today.  He underwent placement of a  left thigh AV graft on December 1st.  They have begun to cannulate the  graft without difficulty.  On exam today, the groin incision is well-  healed.  There is an easily audible bruit.  Graft seems to be  functioning well at this point.  He will follow up on an as-needed  basis.   Janetta Hora. Fields, MD  Electronically Signed   CEF/MEDQ  D:  03/30/2008  T:  03/31/2008  Job:  [id number]

## 2010-07-17 NOTE — Assessment & Plan Note (Signed)
OFFICE VISIT   CAETANO, OBERHAUS  DOB:  June 18, 1960                                       03/09/2008  IONGE#:[id number]   The patient returns for follow-up today after placement of a left thigh  AV graft on December 1st.  He was seen by Dr. Edilia Bo on December 15th  for some breakdown of his left groin wound.   PHYSICAL EXAMINATION:  Today, blood pressure 152/72.  Pulse is 72 and  regular.  The left thigh graft has an audible bruit and palpable thrill.  There is still some dry eschar in the left groin.  There is no erythema.  There is no drainage.  Overall, this appears to be healing well but  probably needs to be rechecked again in a few weeks.   I told him today that it would be okay to go ahead and cannulate the  graft and that hopefully he can get his catheter removed in the near  future.  He will follow up with me in three weeks' time, or sooner, if  he has any further breakdown of the groin wound.  He will continue to  place a dry dressing on this with some bacitracin ointment.   Janetta Hora. Fields, MD  Electronically Signed   CEF/MEDQ  D:  03/09/2008  T:  03/10/2008  Job:  [id number]

## 2010-07-17 NOTE — Assessment & Plan Note (Signed)
[facility]                         ELECTROPHYSIOLOGY OFFICE NOTE   [name], [name]                       MRN:          [id number]  DATE:09/09/2006                            DOB:          [birth year]    [name] returns today for followup.  He is a very pleasant middle-  aged man with end-stage renal disease on hemodialysis, status post renal  transplant with a history of ventricular tachycardia and nonischemic  cardiomyopathy.  His EF's in the past have been between 40-45%.  The  patient had recurrent episodes of VT with multiple IC shocks and  underwent ventricular tachycardia ablation with the procedure  complicated by pericardial tamponade.  He was ultimately treated  successfully and returns today for followup.  He notes in the interim  that he has had worsening kidney dysfunction, and his transplanted  kidney is now nearing the requirement for dialysis.  He denies chest  pain.  He has some dyspnea with exertion.  He has mild peripheral edema.   MEDICATIONS:  1. HCTZ 12.5 q.o.d.  2. Felodipine 5 mg daily.  3. Crestor 5 every other day.  4. Furosemide 40 mg 1-2 tablets daily.  5. He also gets an iron and an EPO shot.   PHYSICAL EXAMINATION:  GENERAL:  He is a pleasant, chronically ill-  appearing middle-aged man in no distress.  VITAL SIGNS:  Blood pressure 160/78, pulse 84 and regular, respirations  18.  Weight 155 pounds.  NECK:  No jugular venous distention.  LUNGS:  Clear bilaterally to auscultation.  There are no wheezes, rales  or rhonchi.  CARDIOVASCULAR:  Regular rate and rhythm with a normal S1 and S2.  There  are no murmurs, rubs or gallops except for a soft S4 being present.  EXTREMITIES:  Trace peripheral edema bilaterally.  He has a nice thrill  in his right forearm where his dialysis site was placed.   Interrogation of his defibrillator demonstrates a [employer] with P  waves greater than 3 and R waves of 70,  impedance 430 in the atrium and  335 in the ventricle.  The threshold of 1 volt at 0.5 in the atrium,  1.25 at 0.5 in the ventricle.  Battery voltage is 3.15 volts.  There are  no intercurrent ICD therapies.   IMPRESSION:  1. Nonischemic cardiomyopathy.  2. Ventricular tachycardia.  3. Status post ablation.  4. End-stage renal disease, now approaching the need for the return to      dialysis.   DISCUSSION:  Overall, [name], from a cardiac perspective is stable.  He states that he is undergoing consideration for redo transplant of his  kidney.  We will see him back in the office in a year for EP followup.  He will continue in our housecall program.     Doylene Canning. Ladona Ridgel, MD  Electronically Signed    GWT/MedQ  DD: 09/09/2006  DT: 09/09/2006  Job #: [id number]

## 2010-07-17 NOTE — Assessment & Plan Note (Signed)
OFFICE VISIT   [name], [name]  DOB:  April 12, 1960                                       10/20/2007  CZYSA#:[id number]   I saw the patient in the office today for continued followup after  removal of infected right thigh AV graft.   EXAMINATION:  He still has a small open area at the groin incision and  an eschar over the distal counterincision.   I think it would be best not to place a new left thigh graft until these  have healed.  For this reason I plan on seeing him back in 3 weeks.  Once these are healed, will plan on placing a left thigh graft.   Di Kindle. Edilia Bo, M.D.  Electronically Signed   CSD/MEDQ  D:  10/20/2007  T:  10/21/2007  Job:  [id number]

## 2010-07-17 NOTE — Assessment & Plan Note (Signed)
OFFICE VISIT   [name], [name]  DOB:  1960/07/27                                       11/10/2007  [id number]#:[id number]   I saw the patient in the office today for continued followup after  removal of his infected right thigh AV graft.  He has no specific  complaints.  He has had no fever.   PHYSICAL EXAMINATION:  On physical examination blood pressure is 172/91,  heart rate 69.  He had an eschar at the distal incision which I debrided  in the office today.  The groin incision is almost completely healed.   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  For this reason I have explained I do not want to  place a new graft until these wounds have completely healed.  I think he  would be reasonable to place a left thigh graft.  He does have a  transplant on the left thigh which is nonfunctioning and this should not  significantly affect the circulation.  He has a palpable femoral and  popliteal pulse on the left but no pedal pulses.   I offered to see the patient back in 2 weeks, however, he would prefer  to call once the wounds in the right thigh have healed and when he  returns for an office visit at that time we will obtain ABIs and then  hopefully can schedule him for a new left thigh AV graft.   Di Kindle. Edilia Bo, M.D.  Electronically Signed   CSD/MEDQ  D:  11/10/2007  T:  11/11/2007  Job:  [id number]   cc:   [employer]

## 2010-07-17 NOTE — Op Note (Signed)
NAMESTORMY, [name]:  [id number]   MEDICAL RECORD NO.:  [id number]          PATIENT TYPE:  OIB   LOCATION:  6705                         FACILITY:  [facility]   PHYSICIAN:  Janetta Hora. Fields, MD  DATE OF BIRTH:  November 05, 1960   DATE OF PROCEDURE:  09/15/2007  DATE OF DISCHARGE:                               OPERATIVE REPORT   PROCEDURE:  Right thigh arteriovenous graft.   PREOPERATIVE DIAGNOSIS:  End-stage renal disease.   POSTOPERATIVE DIAGNOSIS:  End-stage renal disease.   ANESTHESIA:  General.   ASSISTANT:  Delight Hoh, RNFA   OPERATIVE FINDINGS:  1. A 6-mm PTFE end-to-side.  2. Common femoral vein and superficial femoral artery.   OPERATIVE DETAILS:  After obtaining informed consent, the patient was  taken to the operating room.  The patient was placed supine position on  the operating table.  After induction of general anesthesia, the  patient's right groin and thigh were prepped and draped in usual sterile  fashion.  A longitudinal incision was made in the right groin over the  right common femoral artery.  Incision was carried down through the  subcutaneous tissues down to the level of the saphenous vein.  Saphenous  vein was dissected free circumferentially.  Dissection was carried down  to the level of saphenofemoral junction.  The anterior portion of the  right common femoral vein was dissected free.  Common femoral artery was  then dissected free in the lateral portion of the incision.  Superficial  femoral artery was dissected free circumferentially.  This was thickened  and slightly calcified but overall a soft vessel had good pulsatile  quality.  Vessel loops were placed proximal and distal on the  superficial femoral artery.  Subcutaneous tunnel was then created in a  loop configuration down the right thigh with the venous limb of the  graft on the medial aspect.  The patient was then given 5000 units of  intravenous heparin.  Vessel loops  were used to control the superficial  femoral artery proximally and distally.  Longitudinal opening was made  in the right superficial femoral artery.  The graft was beveled and sewn  into graft to side of artery using running 6-0 Prolene suture.  At  completion anastomosis, this was thoroughly backbled and flushed into  the graft.  The graft was then clamped proximally with a fistula clamp.  Vessel loops were removed.  Attention was then turned to the venous  anastomosis.  Saphenous vein was ligated with 2-0 silk tie and the vein  opened longitudinally and up onto the common femoral vein at the  saphenofemoral junction.  The graft was then beveled and sewn into graft  to the side of vein using a running 6-0 Prolene suture.  Just prior  completion anastomosis, this was forebled, backbled, and thoroughly  flushed.  Anastomosis was secured.  Clamps were released.  There was a  palpable thrill above graft immediately.  Next, hemostasis was obtained.  Deep layers of the groin were closed with multiple layers of running 2-0  and 3-0 Vicryl suture.  Skin was  closed with 4-0 Vicryl subcuticular  stitch.  A transverse incision had been made in the distal thigh for  assistance in tunneling, this was closed with a running 3-0 Vicryl  suture in the deep layer and running 4-0 Vicryl  subcuticular stitch in the skin.  The patient tolerated the procedure  well and there were no complications.  Instruments, sponge, and needle  counts were correct at the end of the case.  The patient was taken to  the recovery room in stable condition.      Janetta Hora. Fields, MD  Electronically Signed     CEF/MEDQ  D:  09/15/2007  T:  09/16/2007  Job:  [id number]

## 2010-07-17 NOTE — Discharge Summary (Signed)
NAMERANJIT, [name]:  [id number]   MEDICAL RECORD NO.:  [id number]          PATIENT TYPE:  INP   LOCATION:  4709                         FACILITY:  [facility]   PHYSICIAN:  Doylene Canning. Ladona Ridgel, MD    DATE OF BIRTH:  04/16/1960   DATE OF ADMISSION:  04/28/2008  DATE OF DISCHARGE:  04/29/2008                               DISCHARGE SUMMARY   This patient has allergies to PROTAMINE with blood pressure drops,  DILAUDID with respiratory depression, AVELOX gives a rash, ROCEPHIN 1 g  mixed with XYLOCAINE 1% and CIPRO.   FINAL DIAGNOSES:  1. Admit with appropriate implantable cardioverter-defibrillator      therapy x1.      a.     Concurrent syncope.   SECONDARY DIAGNOSES:  1. History of polymorphic ventricular tachycardia, cardioverter-      defibrillator implanted in November 2005.  2. Ventricular tachycardia ablation in 2006.  3. Echocardiogram on March 18, 2007, ejection fraction of 45%,      akinesis of the inferoseptal wall, akinesis of the inferior wall.  4. End-stage renal disease on hemodialysis, Monday, Wednesday, and      Friday.  5. History of pancreatic transplant in 2002.  6. Hypertension.  7. Hypothyroidism.  8. Secondary hyperparathyroidism.  9. Anemia on chronic disease.  10.Intraoperative ventricular hemorrhage.   The patient is on Dilantin for seizure prophylaxis.  Of note, troponin I  studies were negative x2 this admission at 0.02, then 0.02.  The TSH is  pending.  The magnesium is 2.5.  No procedures this admission.   BRIEF HISTORY:  [name] is a 50 year old male.  He was working with  his stove this morning loading it.  He sat down to rest.  He started  feeling lightheadedness.  His vision went dark and the next thing he  remembers was waking up in the chair.  He denies palpitations, chest  pain, shortness of breath, or dyspnea on exertion.  He does not have  chest pain with exertion.  He has no focal numbness or weakness.  He  does  fatigue easily.  The patient presents and was found to have a V-fib  with rates in 300 rate per minute and had received inappropriate shock  from a [employer] cardioverter defibrillator.  The patient has had no  discharges since January 2008.  The patient will be admitted and cardiac  enzymes will be cycled, electrolytes will be checked, and the TSH will  be checked with EP assessment the morning of April 29, 2008.   HOSPITAL COURSE:  The patient admitted.  The patient was seen by Dr.  Ladona Ridgel.  Overall, the patient has been doing very well.  Dr. Ladona Ridgel is  not suspected.  This V-tach will be readily recurrent and so the patient  will discharge hospital day #2.  He will maintain his preoperative  medications and they are as follows.  1. Prograf 1 mg 2 capsules in the morning, 2 capsules in the evening.  2. Prednisone 5 mg daily.  3. Synthroid 175 mcg daily.  4. Nephro-Vite 1 tablet  daily.  5. Crestor 5 mg every other day.  6. Nizatidine 150 mg daily.  7. Azathioprine 50 mg 1 to 1/2 tablet daily.  8. Salmon oil 1000 mg one cap in the morning and one cap in the      evening.  9. Lutein 20 mg one cap daily.  10.Fosrenol 1000 mg 1 tablet with meals and snacks.  11.Metoprolol tartrate 25 mg to take with blood pressure greater than      140.  12.Fosinopril 40 mg daily.  13.Lumigan ophthalmic solution 0.03% one drop each eye daily in the      evening.  14.Dilantin 100 mg one cap every 8 hours.  15.Metoprolol succinate 25 mg daily.   LABORATORY STUDIES THIS ADMISSION:  Hemoglobin 13.1, hematocrit 38.7,  white cells 4.7, platelets of 142.  Serum electrolytes:  Sodium 138,  potassium 3.9, chloride 99, carbonate 29, BUN is 56, creatinine is 6.89,  glucose is 86.  Protime 14.9, INR is 1.1.  Troponin I 0.02, then 0.02.  Magnesium is 2.5.  TSH is pending at discharge.      Maple Mirza, PA      Doylene Canning. Ladona Ridgel, MD  Electronically Signed    GM/MEDQ  D:  04/29/2008  T:   04/30/2008  Job:  [id number]

## 2010-07-17 NOTE — Assessment & Plan Note (Signed)
OFFICE VISIT   QUENTYN, KOLBECK  DOB:  06-Dec-1960                                       09/29/2007  BJYNW#:[id number]   The patient had removal of infected right thigh AV graft on 09/18/2007.  He has a functioning right IJ Diatek catheter.  He comes in for a  routine wound check.  It has been 10 days since his surgery and his  incisions are healing adequately.  We removed his staples and sutures in  the office today.  I have instructed him that he no longer needs to  receive intravenous antibiotics at dialysis as there currently appears  to be no signs of infection in the right thigh.  I plan on seeing him  back in 3 weeks for a final wound check and if it appears that  everything looks good, we could potentially schedule him for a left  thigh AV graft.   Di Kindle. Edilia Bo, M.D.  Electronically Signed   CSD/MEDQ  D:  09/29/2007  T:  09/30/2007  Job:  [id number]

## 2010-07-17 NOTE — Op Note (Signed)
[name], [name]                ACCOUNT NO.:  [id number]   MEDICAL RECORD NO.:  [id number]          PATIENT TYPE:  AMB   LOCATION:  SDS                          FACILITY:  [facility]   PHYSICIAN:  Juleen China IV, MDDATE OF BIRTH:  [birth year]   DATE OF PROCEDURE:  DATE OF DISCHARGE:  03/04/2007                               OPERATIVE REPORT   PREOPERATIVE DIAGNOSIS:  Thrombosed right forearm graft.   POSTOPERATIVE DIAGNOSIS:  Thrombosed right forearm graft.   PROCEDURE PERFORMED:  Thrombectomy of the right forearm graft.   TYPE OF ANESTHESIA:  MAC.   ESTIMATED BLOOD LOSS:  200.   FINDINGS:  The patient quickly forms thrombus.  Cannot give heparin due  to recent subarachnoid hemorrhage.   HISTORY:  This is a 50 year old gentleman who dialyzed through a right  forearm graft.  This was recently thrombectomized and revised by Dr.  Darrick Penna.  The patient comes back in with thrombosed graft.  He had a  subarachnoid bleed in November 2008, and we have been advised not to  give heparin.  The patient was taken to the operating room for procedure  after informed consent was signed.   DESCRIPTION OF PROCEDURE:  The patient was identified in the holding  area and taken to room 6.  He was placed supine on the table.  The right  arm was prepped and draped in a standard sterile fashion.  Time out was  called and perioperative antibiotics were administered.  The patient's  longitudinal incision was opened and just above the antecubital fossa,  this was his previous incision.  A 1% lidocaine was used for anesthesia.  The previous sutures were removed and with aide of V-Lander retractor  the graft was easily exposed.  The graft to vein anastomosis was fully  mobilized.  This was an end-to-end anastomosis.  The graft was clearly  thrombosed.  Prolene sutures were identified proximal to the vein  anastomosis, which were the previous site for graftomy.  These sutures  were removed first performed  thrombectomy of the venous outflow.  This  was done with #4 Fogarty.  Thrombus was evacuated and adequate back  bleeding was established.  Next, I attempted to pass the Fogarty across  the arterial anastomosis.  Multiple attempts with #4, #5, and #3 Fogarty  were performed; however, was unable to advance the Fogarty catheter  across the arterial anastomosis to remove the arterial plug.  For that  reason, I had to expose the arterial anastomosis.  The transverse  incision at the antecubital fossa was then opened after it was  anesthetized with 1% lidocaine.  This had been previously opened as of  the most recent operation, therefore the sutures were removed and a V-  Zada Girt was used to expose the graft.  The arterial side of the graft had  not been fully isolated, therefore with the sharp dissection the  arterial end of the graft was fully exposed.  Once this was done #11  blade was used to make a graftomy.  From this site just distal to the  arterial anastomosis, a #4  Fogarty was passed into the brachial artery  and a thrombectomy was performed.  This established excellent antegrade  bleeding.  A baby Earl Lites clamp was then placed to occlude blood flow.  This graftomy was closed with interrupted 6-0 Prolene.  At this  completion of the repair, I was able to pass a #6 Fogarty into the  brachial artery from the upper arm incision where the graft was exposed  and there was adequate bleeding; however, I did evacuate additional  thrombus.  The graft was then instilled with heparinized saline and a  baby Gregory clamp was placed at the arterial anastomosis to occlude  flow.  The graftomy at the level of the venous outflow tract was then  closed with interrupted CV6 gore suture.  Prior to completing this  closure again passed a Fogarty proximal into the vein and evacuated  additional thrombus and also passed #6 Fogarty back into the brachial  artery and again evacuated additional thrombus.  There  was, however, at  this time adequate antegrade and retrograde bleeding.  The graftomy site  was then closed.  The CV6 was tied down.  There was a palpable thrill  within the graft.  There was also an audible Doppler signal on the  venous outflow tract.  The patient had a Doppler signal in his radial  artery as well as his ulnar artery; however, the ulnar is much more  dampened.  He also has a palpable radial pulse.  With this, I elected to  terminate the procedure.  The wound was irrigated.  The deep tissues  were reapproximated with interrupted 3-0 Vicryl and the skin was closed  with a running 3-0 nylon.  The patient tolerated the procedure well and  was taken to the recovery room in a stable condition.           ______________________________  V. Charlena Cross, MD  Electronically Signed     VWB/MEDQ  D:  03/04/2007  T:  03/05/2007  Job:  [id number]

## 2010-07-17 NOTE — H&P (Signed)
[name], TANGONAN NO.:  [id number]   MEDICAL RECORD NO.:  [id number]          PATIENT TYPE:  INP   LOCATION:  1843                         FACILITY:  [facility]   PHYSICIAN:  Di Kindle. Edilia Bo, M.D.DATE OF BIRTH:  1960-11-09   DATE OF ADMISSION:  09/17/2007  DATE OF DISCHARGE:                              HISTORY & PHYSICAL   REASON FOR ADMISSION:  Fever, likely secondary to infected right thigh  AV graft.   HISTORY:  This is a pleasant 50 year old gentleman with end-stage renal  disease, who had a right thigh AV graft placed on September 15, 2007 by Dr.  Darrick Penna.  He presented to an outlying emergency department with a fever  reportedly of 102.6.  He was transferred here for vascular evaluation.  He states that the thigh has been tender since surgery.  He has really  had no other specific complaints.  He has had no cough, dysuria.  Of  note, this patient had been admitted with fever and hemoptysis on July  1.  He had a right lower lobe infiltrate by chest x-ray and he does have  a history of immunosuppression secondary to previous transplant.   PAST MEDICAL HISTORY:  Significant for:  1. Previous pancreatic transplant and he is on continued      immunosuppressive therapy.  2. End-stage renal disease, he dialyzes Monday, Wednesday and Friday.  3. History of anemia.  4. History of secondary hyperparathyroidism.  5. Hypothyroidism.  6. History of hypertension.  7. History of ischemic cardiomyopathy.  He has nonobstructive coronary      disease.  8. History of an intraoperative ventricular hemorrhage in November      2008.  He has been on Dilantin for seizure precautions.  The      patient denies any history of diabetes.  He has had previous      amputation of the left first, second and third toes.  9. He has a history of a polymorphic ventricular tachycardia and he      has had a previous implantable defibrillator by Dr. Ladona Ridgel in      November 2005.  This was  a [employer]. Jude ICD and they are reps number is      [phone].  10.He also has a history of tamponade and sternotomy to control the      bleeding secondary to a radio frequency ablation of ventricular      tachycardia.  He has a history of peptic ulcer disease and      asymptomatic cholelithiasis.  He also has a history of C. diff in      the past.   ALLERGIES:  CIPRO, PROTAMINE, AVELOX AND DILAUDID.   MEDICATIONS:  1. Metoprolol 25 mg p.o. b.i.d., hold for systolic pressure less than      140.  2. Nizatidine 150 mg p.o. daily.  3. Lumigan 1 drop both eyes daily.  4. Flagyl 500 mg p.o. t.i.d.  5. Prednisone 5 mg p.o. daily.  6. Synthroid 175 mcg p.o. daily.  7. Nephro-Vite 1 p.o. daily.  8. Crestor 5 mg every other  day.  9. Dilantin 100 mg p.o. t.i.d.  10.Prograf 2 mg p.o. b.i.d.  11.Salmon oil 600 mg p.o. daily.   FAMILY HISTORY:  There is no history of premature cardiovascular  disease.   SOCIAL HISTORY:  Currently lives with his wife in [place].  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  He denies tobacco use.   REVIEW OF SYSTEMS:  He has had a fever.  He has had no recent weight  loss, weight gain.  CARDIAC:  He had no recent chest pain, chest  pressure, palpitations or arrhythmias.  PULMONARY:  He has had no recent  productive cough, bronchitis, asthma or wheezing.  GI:  He has had no  recent change in his bowel habits.  GU:  He has had no dysuria or  frequency.  HEMATOLOGIC:  He has a history of anemia.  He has had no  bleeding problems or clotting disorders.  VASCULAR:  He has had no  claudication, rest pain or nonhealing ulcers.  He has had no DVT or  phlebitis.   PHYSICAL EXAMINATION:  Temperature is 98.  Here on admission blood  pressure 135/70.  Heart rate is 90.  HEENT:  Neck is supple.  There is no cervical lymphadenopathy.  LUNGS: Are clear bilaterally to auscultation.  CARDIAC:  He has a regular rate and rhythm.  ABDOMEN:  Soft, nontender.  He has a catheter his  right IJ with no  tenderness or erythema and he is an ICD in the right which does not  appear infected.  His abdomen is soft and nontender.  He has normal  pitch bowel sounds.  His thigh graft is patent with a good bruit and  thrills.  Incisions look okay.  He does have warmth over the thigh.  There is some mild cellulitis.  Both feet appear adequately perfused,  although I cannot palpate pedal pulses.  He has no significant lower  extremity swelling.  NEUROLOGIC:  Exam is nonfocal.   IMPRESSION:  This patient presents with a fever documented at the  outlying hospital.  The most likely source would be his right thigh  atriovenous graft, as he has some erythema in this area and also some  tenderness and warmth.  He will be started on intravenous vancomycin and  Zosyn.  Will follow his exam and if his fever does not resolve, it will  be necessary to remove his thigh graft.  He does have a functioning  catheter, which does not appear to be infected.  I will ask for the  nephrologist's help with his management.   His Dilantin level at the referring institution was low.  He will be  restarted on his p.o. Dilantin and he can be given intravenous Dilantin  if necessary.      Di Kindle. Edilia Bo, M.D.  Electronically Signed     CSD/MEDQ  D:  09/17/2007  T:  09/17/2007  Job:  [id number]

## 2010-07-17 NOTE — Discharge Summary (Signed)
NAMEBEVERLEY, [name]:  [id number]   MEDICAL RECORD NO.:  [id number]          PATIENT TYPE:  INP   LOCATION:  3740                         FACILITY:  [facility]   PHYSICIAN:  Maple Mirza, PA   DATE OF BIRTH:  1961-01-26   DATE OF ADMISSION:  03/17/2007  DATE OF DISCHARGE:  03/19/2007                               DISCHARGE SUMMARY   ALLERGIES:  1. DILAUDID.  2. AVELOX.  3. PROTAMINE.   FINAL DIAGNOSES:  1. Admitted with syncopal episode concurrent with appropriate      implantable cardioverter-defibrillator therapy at 5:20 p.m. on      March 17, 2007 for ventricular fibrillation.  2. Echocardiogram done this admission shows ejection fraction of 45%,      akinesis in the inferoseptal wall and akinesis in the inferior      wall, restrictive physiology decreased diastolic compliance.   SECONDARY DIAGNOSES:  1. Nonischemic cardiomyopathy.      a.     History of ventricular tachycardia.      b.     Failed sotalol.      c.     Status post ventricular tachycardia ablation in 2006.      d.     Ablation therapy complicated by tamponade post ablation       which required median sternotomy staunch bleeding.      e.     St. Jude atlas cardioverter-defibrillator implanted for       nonischemic cardiomyopathy.  Of note, last shock was 2006 and the       patient then underwent ventricular tachycardia ablation.  2. Intracranial bleed November 2008, conservative management, off      aspirin.  3. History of end-stage renal disease.      a.     Kidney/pancreas transplant in 2002.      b.     Failed transplant.  Restarted hemodialysis August 2008.  The       patient has had 3 kidney transplants.  4. Diabetes.  5. Hypertension.  6. Gastroesophageal reflux disease.  7. New York Heart Association Class III congestive heart failure.  8. Treated hypothyroidism.  9. Glaucoma.   PROCEDURES:  1. As mentioned above, 2-D echocardiogram.  This was done January 14.      The  study showed ejection fraction of 45%, akinesis in the      inferoseptal wall, akinesis in the inferior wall, restrictive      physiology decreased diastolic compliance, high pressures in the      left atrium, high pressures left ventricle.  2. We had requested a stress test from prior visit at [facility] by sending 4 faxes over a 24-hour period with no      response.   BRIEF HISTORY:  [name] is a 50 year old male.  He has a history of  nonischemic cardiomyopathy.  His last EF was 45%.   He has a history of monomorphic VT.  He had implantation of an ICD.  He  had ablation of the VT in 2006 after ICD  discharge.  This was his last  ICD discharge.   The patient presents to [facility] after syncopal episode.  He  said he was working in the yard and does not recall any preceding  symptoms.  He denies chest pain, palpitation, diaphoresis.  He found  himself on the ground.  He did not have incontinence.  The cardioverter  defibrillator was interrogated and showed ventricular fibrillation and  required appropriate cardioverter-defibrillator therapy.   The patient denies any recent anginal symptoms.  He denies any recent  worsening heart failure symptoms.  He had been in the hospital recently  for lower GI complaint with diarrhea and abdominal pain.  He has not  been very active.   HOSPITAL COURSE:  The patient presented to [facility] Emergency  Room after syncope concurrent with V-fib cardiac arrest which was  treated with ICD therapy.  The patient's C. diff, by the way, was  negative and the patient does not need to take Flagyl any longer.  She  is not complaining of recurrent diarrhea.  The patient has had no  cardiac dysrhythmias during this hospitalization.  No further shock.  A  2-D echocardiogram shows ejection fraction maintained at 45%.  He is  anemic with a hemoglobin of 8, but this has been stable.  He has been  monitored through his  recent hospitalization here for lower GI  complaints and his hemoglobin has been running 8 to 8.5 at that time.  He underwent hemodialysis here on his regular hemodialysis day which was  Wednesday and he will discharge today, Thursday, with hemodialysis at  his home dialysis center [place], [place].  In addition, his  diabetes has been carefully monitored here.   He is discharged on his preadmission medications.  They are:  1. Metoprolol 25 mg every 6 hours to take if blood pressure greater      than 130.  2. Prograf 2 mg twice daily.  3. Prednisone 5 mg daily.  4. Axid 150 mg daily.  5. Prinivil 10 mg daily to give only if blood pressure greater than      130 mm systolic.  6. Plendil 10 mg daily.  Give only if blood pressure greater than 130      mm systolic.  7. Dilantin 100 mg three times daily.  8. Fosrenol 1 gram three times daily before and with meals.  9. Crestor 5 mg every other day at bedtime  10.Multivitamin daily.  11.Lumigan 0.03% ophthalmic solution 1 drop in both eyes at bedtime.  12.Imuran 75 mg daily.  13.Synthroid 175 mcg daily.  14.Omega-3 acid 1 capsule daily.  15.Lutein 20 mg daily.  16.Aranesp, which is equivalent to 25,000 units of Epogen every      hemodialysis.   He will follow-up at his hemodialysis center in [place] and continue  Monday, Wednesday, Friday dialysis.  He has an office visit with Dr.  Ladona Ridgel in 2-3 weeks at Dr. Lubertha Basque office.  He will call with that  appointment.   Laboratory studies pertinent to this admission on January 14, hemoglobin  was 8, hematocrit 23.5, white cells 5.4, platelets of 192.  His serum  electrolytes on January 14,  sodium 139, potassium 5.2, chloride 102,  carbonate 27, BUN is 39, creatinine 6.67, glucose is 76, pro time 14.8,  INR 1.1.  alkaline phosphatase, this admission 143, SGOT 17, SGPT is 14.  Troponin I studies were 0.05 then 0.05 this admission.  Magnesium was  2.2.  Phosphorus  on  admission was  5.  Urinalysis was negative.  BNP  on admission January 14  was 2254.  TSH was 0.713.  Giardia screen was negative.  Occult blood  pending.  C. diff as mentioned above.  It has been accessed on the  computer and is negative.      Maple Mirza, PA     GM/MEDQ  D:  03/19/2007  T:  03/20/2007  Job:  [id number]   cc:   Doylene Canning. Ladona Ridgel, MD  [facility]

## 2010-07-17 NOTE — Assessment & Plan Note (Signed)
[facility]                         ELECTROPHYSIOLOGY OFFICE NOTE   NAME:[name], [name]                       MRN:          [id number]  DATE:08/06/2007                            DOB:          03-29-1960    HISTORY:  [name] returns today in followup.  He is a very pleasant  middle-aged male with multiple medical problems including nonischemic  cardiomyopathy, congestive heart failure, end-stage renal disease on  hemodialysis, ventricular tachycardia status post ablation, who returns  today for followup.  He denies chest pain.  He denies shortness of  breath today.  He states that since his dry weight was raised in  dialysis, he has improved.   CURRENT MEDICATIONS:  1. Prednisone 5 mg daily.  2. Salmon oil.  3. Metoprolol 25 mg twice daily.  4. Multiple vitamins.  5. Azathioprine 75 mg daily.  6. Lumigan eye drops.   PHYSICAL EXAMINATION:  GENERAL:  He is a pleasant middle-aged man in no  acute distress.  VITAL SIGNS:  Blood pressure was 100/60, the pulse 88 and regular,  respirations 18, weight was 152 pounds.  NECK:  Revealed no jugular venous distention.  LUNGS:  Clear bilaterally to auscultation.  No wheezes, rales or rhonchi  are present.  CARDIOVASCULAR:  Regular rate and rhythm.  Normal S1-S2.  Soft S4 gallop  is present.  PMI was enlarged and laterally displaced.  EXTREMITIES:  Demonstrated no edema.   PROCEDURE:  Interrogation of his defibrillator demonstrates a St. Jude  Atlas V - 243 implanted back in November 2005.  P-waves are greater than  3.  R-waves greater than 12.  The impedance 500 in the A, 335 in the V,  threshold 1.75 volts at 0.8 milliseconds, 2.25 volts at 0.8 milliseconds  in the V.  Battery voltage was 2.95 volts.  Today, his outputs were  increased at 3.8 in the A and 4.8 in the V.  We turned his AV delay out  to 300 to minimize ventricular pacing.   IMPRESSION:  1. Nonischemic cardiomyopathy.  2. Congestive  heart failure.  3. Ventricular tachycardia.  4. End-stage renal disease on dialysis.   DISCUSSION:  Overall, [name] is stable.  His defibrillator is  working normally.  His heart failure is well controlled.   FOLLOW UP:  We will plan to see the patient back in several months.     Doylene Canning. Ladona Ridgel, MD  Electronically Signed    GWT/MedQ  DD: 08/06/2007  DT: 08/06/2007  Job #: [id number]

## 2010-07-17 NOTE — Discharge Summary (Signed)
[name], [name]:  [id number]   MEDICAL RECORD NO.:  [id number]          PATIENT TYPE:  INP   LOCATION:  3740                         FACILITY:  [facility]   PHYSICIAN:  Maple Mirza, PA   DATE OF BIRTH:  1961-01-21   DATE OF ADMISSION:  03/17/2007  DATE OF DISCHARGE:  03/19/2007                               DISCHARGE SUMMARY   ADDENDUM:  These are amendments to the medications:  1. He is not to take aspirin going out of here.  He has a history of      intracranial bleed thus spontaneously.  2. Metoprolol has been changed 25 mg b.i.d., but he is not to take it      if his blood pressure is less than 100.  He is not to taken on      hemodialysis days until after hemodialysis.  He is not to take it      if he feels dizzy when he takes it, and he does not need to take      Plendil or Prinivil if he is taking metoprolol.  Both of those can      stop.  Those are the amendments to his medications with the      addition of metoprolol to his medication list, but was obviously      with all of these proviso.      Maple Mirza, PA     GM/MEDQ  D:  03/19/2007  T:  03/20/2007  Job:  [id number]   cc:   Doylene Canning. Ladona Ridgel, MD  [facility]

## 2010-07-17 NOTE — Op Note (Signed)
NAMENICKOLAUS, BORDELON                ACCOUNT NO.:  [id number]   MEDICAL RECORD NO.:  [id number]          PATIENT TYPE:  AMB   LOCATION:  SDS                          FACILITY:  [facility]   PHYSICIAN:  Quita Skye. Hart Rochester, M.D.  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   OPERATION:  1. Bilateral ultrasound localization internal jugular veins.  2. Left internal jugular venogram.  3. Insertion Diatek catheter via right internal jugular vein (28 cm).   SURGEON:  Quita Skye. Hart Rochester, MD.   FIRST ASSISTANT:  Nurse.   ANESTHESIA:  Local.   PROCEDURE:  The patient was taken to the operating room, placed in  supine position at which time upper chest and neck were exposed.  Both  internal jugular veins were imaged using B-mode ultrasound.  The left  seemed larger than the right.  After prepping and draping in a routine  sterile manner, since a pacemaker was present in the right  infraclavicular area using the right subclavian vein, the left internal  jugular vein was entered using a supraclavicular approach.  Guidewire  would not advance across the mediastinum after multiple attempts.  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  Therefore, the  guidewire was removed.  compression applied.  Attention turned to the  right side where the right internal jugular vein was entered using a  supraclavicular approach.  Guidewire passed into the right atrium under  fluoroscopic guidance.  After dilating the tract appropriately, a 28-cm  Diatek catheter  was positioned in the right atrium, tunneled  peripherally, and secured with nylon sutures.  The wound closed with  Vicryl in a subcuticular fashion.  Sterile dressing was applied.  The  patient was taken to recovery in satisfactory condition.      Quita Skye Hart Rochester, M.D.  Electronically Signed     JDL/MEDQ  D:  09/09/2007  T:  09/10/2007  Job:  [id number]

## 2010-07-17 NOTE — Discharge Summary (Signed)
NAMESOHAIB, VEREEN NO.:  [id number]   MEDICAL RECORD NO.:  [id number]          PATIENT TYPE:  INP   LOCATION:  6705                         FACILITY:  [facility]   PHYSICIAN:  Terrial Rhodes, M.D.DATE OF BIRTH:  February 17, 1961   DATE OF ADMISSION:  09/02/2007  DATE OF DISCHARGE:  09/04/2007                               DISCHARGE SUMMARY   DISCHARGE DIAGNOSES:  1. Febrile illness, resolved.  2. Acute diarrhea, resolved.  3. End-stage renal disease.  4. Hypotension.  5. Status post pancreatic transplant.  6. Anemia.  7. Secondary hyperparathyroidism.   PROCEDURES:  Hemodialysis.   HISTORY OF PRESENT ILLNESS:  [name] is a 50 year old Caucasian male  who was admitted with a less than 24-hour febrile illness with a  temperature spike of 104.0.  Wife brought the patient into the emergency  room for shaking chills during the night and emesis x1.  In the  emergency room, the patient complained of thigh myalgias, no joint pain.  Complained of some right side chest soreness, no radiation, and no  dyspnea.  Denied urinary dysuria, frequency, or burning.  No upper  respiratory symptoms.  He had been exposed to his wife who was diagnosed  with flu in an Urgent Care Center, but influenza A and B were negative  per the wife's report.   Admission labs, WBC 10.3.   Admission chest x-ray suspected right lower lobe pneumonia.   HOSPITAL COURSE:  1. Febrile illness, resolved.  The patient was admitted to a private      room, given his immunosuppressed state.  He was placed on droplet      precautions secondary to his flu-like symptoms.  The patient was      placed on azithromycin as well as Zosyn.  Followup chest x-ray      showed no pleural edema, but suspected right lower lobe pneumonia.      After 24 hours of droplet precautions and antibiotics, the patient      began to improve, fevers began to trend down.  The lethargy was      improving as well.  At time of  discharge, Influenza A and B were      negative.  H1N1 was not complete at time of discharge, though the      patient had been asymptomatic for 24 hours prior to discharge.      Antibiotics were discontinued and the patient continued with his      remainder course of azithromycin.  2. Acute diarrhea.  Diarrhea started approximately 24 hours after      admission, presumed secondary to the antibiotic therapy for #1.  C.      diff was checked and was negative.  The patient was placed on      empiric Flagyl, but given diarrhea had subsided prior to discharge      this was discontinued, there were no further episodes.  3. End-stage renal disease.  The patient underwent hemodialysis this      admission on September 02, 2007, and again on September 04, 2007, prior to  discharge.  There was no complications or interventions.  4. Hypotension, this was thought secondary to #1.  This resolved once      his febrile illness resolved and there were no interventions.  5. Status post pancreatic transplant.  The patient continued on his      immunosuppressant medications, no further problems.  6. Anemia of chronic disease.  Blood count remained in the 14-15 range      this admission and he did not require any transfusions or      medications.  7. Secondary hypoparathyroidism.  The patient's vitamin D as well as      phosphate binders were continued.   DISCHARGE MEDICATIONS:  1. Prograf 2 mg b.i.d.  2. Prednisone 5 mg daily.  3. Synthroid 175 mcg daily.  4. Nephro-Vite daily.  5. Crestor 5 mg daily.  6. Dilantin 100 mg t.i.d.  7. Fosrenol 1 g t.i.d.  8. Lisinopril 10 mg nightly.  9. Plendil 10 mg nightly.  10.Imuran 75 mg daily.  11.Lopressor 25 mg nightly.  12.Zithromax 250 mg daily for 3 additional days.   DISCHARGE INSTRUCTIONS:  The patient discharged on September 04, 2007.  The  patient was afebrile and had been ambulating in the room without  problems.  He will increase his activity slowly.  He will  follow up in  the Dialysis Center at his usual scheduled time.      [name] Fallen, PA    ______________________________  Terrial Rhodes, M.D.    MY/MEDQ  D:  09/29/2007  T:  09/30/2007  Job:  [id number]   cc:   [facility]  [facility]

## 2010-07-17 NOTE — Op Note (Signed)
NAMEROLLINS, WRIGHTSON NO.:  [id number]   MEDICAL RECORD NO.:  [id number]          PATIENT TYPE:  INP   LOCATION:  6708                         FACILITY:  [facility]   PHYSICIAN:  Di Kindle. Edilia Bo, M.D.DATE OF BIRTH:  [birth year]   DATE OF PROCEDURE:  09/18/2007  DATE OF DISCHARGE:                               OPERATIVE REPORT   PREOPERATIVE DIAGNOSIS:  Infected right thigh arteriovenous graft.   POSTOPERATIVE DIAGNOSIS:  Infected right thigh arteriovenous graft.   PROCEDURE:  Removal of infected right thigh AV graft with vein patch  angioplasty of the right superficial femoral artery.   SURGEON:  Di Kindle. Edilia Bo, MD   ASSISTANT:  Zenaida Niece, RNFA   ANESTHESIA:  General.   TECHNIQUE:  The patient was taken to the operating room and received a  general anesthetic.  The right thigh was prepped and draped in usual  sterile fashion.  The previous incision in the groin was opened and the  venous and arterial limbs of the graft were dissected free.  The graft  was sewn at the saphenofemoral junction at the venous end.  The arterial  end was sewn to the very proximal superficial femoral artery.  The  common femoral, superficial femoral, and deep femoral arteries were  controlled with vessel loops.  Next, the patient was heparinized.  The  arterial and venous limbs of the graft were ligated and then divided and  the distal incision was opened and the graft removed.  At the venous  end, the saphenofemoral junction was clamped.  The graft was removed in  its entirety from the femoral vein and a remnant of vein was used as a  vein patch and saved on the back field.  Next, the femoral vein was  oversewn with a 5-0 Prolene suture.  Next, the common femoral,  superficial femoral, and deep femoral arteries were controlled and the  entire stub of the arterial limb of the graft was removed from the  artery.  The vein patch was then sewn using continuous 6-0  Prolene  suture.  Prior to completing the closure, the artery was back bled and  flushed.  A 3 Fogarty catheter was passed the entire length down the  superficial femoral artery and no clot was retrieved.  The vein patch  closure was completed and flow re-established to the right leg.  Hemostasis was obtained in the wound.  The wound was closed with deep  layer of 3-0 Vicryl, subcutaneous layer with 3-0 Vicryl, and the skin  closed with staples.  The distal incision was closed with two  interrupted 3-0 nylons.  Sterile dressing was applied.  The patient  tolerated the procedure well and was transferred to recovery room in  satisfactory condition.  All needle and sponge counts were correct.      Di Kindle. Edilia Bo, M.D.  Electronically Signed     CSD/MEDQ  D:  09/18/2007  T:  09/18/2007  Job:  [id number]

## 2010-07-17 NOTE — Discharge Summary (Signed)
[name], [name]:  [id number]   MEDICAL RECORD NO.:  [id number]          PATIENT TYPE:  INP   LOCATION:  6708                         FACILITY:  [facility]   PHYSICIAN:  [name], M.D.DATE OF BIRTH:  1960/05/08   DATE OF ADMISSION:  09/17/2007  DATE OF DISCHARGE:  09/21/2007                               DISCHARGE SUMMARY   ADMISSION DIAGNOSES:  1. Infected right thigh arteriovenous Gore-Tex graft.  2. Fever secondary to infected right thigh arteriovenous Gore-Tex      graft.   DISCHARGE/SECONDARY DIAGNOSES:  1. Infected right thigh arteriovenous Gore-Tex graft, status post      removal, preliminary cultures were negative at the time of this      dictation.  2. History of end-stage renal disease, on dialysis Monday, Wednesday,      and Friday and in [place].  3. History of anemia.  4. Secondary hyperparathyroidism.  5. Hypothyroidism.  6. Hypertension.  7. History of ischemic cardiomyopathy and nonobstructive coronary      artery disease.  8. History of intraoperative ventricular hemorrhage in November 2008,      and is on Dilantin for seizure precautions.  9. History of amputation of left first, second, and third toes.  10.History of polymorphic ventricular tachycardia, status post      implantable defibrillator by Dr. Ladona [name] in November 2005 [facility]      ICD).  11.History of tamponade and sternotomy to control bleeding secondary      to radiofrequency ablation of ventricular tachycardia.  12.Peptic ulcer disease.  13.Asymptomatic cholelithiasis.  14.History of Clostridium difficile.  15.History of pancreatic transplant, and is on immunosuppressive      therapy.  16.History of prior kidney transplant in 2002.   ALLERGIES:  1. PROTAMINE, which caused hypotension.  2. DILAUDID, which resulted in respiratory depression.  3. AVELOX, which caused a rash.   PROCEDURES:  On September 18, 2007, removal of infected right thigh  arteriovenous graft with vein patch angioplasty of the right superficial  femoral artery by Dr. Waverly [name].   HISTORY:  [name] is a 50 year old white male with end-stage renal  disease.  He had a right-sided AV graft placed on September 15, 2007, by Dr.  Darrick [name].  He presented to [facility] emergency department with fever  reportedly of 102.6.  He was transferred to [facility] for vascular  evaluation.  He was evaluated by vascular surgeon Dr. Waverly [name].  He reported the thigh had been tender since surgery, but had  no other specific complaints.  He had no cough or dysuria.  He had had  admission several weeks prior on September 02, 2007, for right lower lobe  infiltrate by chest x-ray and has a history of immunosuppression  secondary to previous pancreatic transplant.  He also has a right IJ  Diatek catheter, which did not appear infected.  Dr. Edilia [name] felt he did  his right thigh AV graft was infected as there is evidence of cellulitis  around the graft site, and felt he should be admitted for IV vancomycin  and Zosyn, and removal of  the graft.   CONSULTS:  Nephrology.   HOSPITAL COURSE:  [name] was admitted to [facility] on September 17, 2007, for infected right thigh AV graft.  As mentioned, he was  placed on vancomycin and Zosyn.  He was taken to the operating room on  September 18, 2007, for removal of the graft.  Intraoperative cultures were  taken currently, these showed no growth to date.  Blood cultures x2 were  also negative to date.  Gram stain showed no organisms with  predominantly PMNs with no organisms.  He has had a relatively  uneventful postoperative course.  He has been on his home medications.  However, Renal did hold his felodipine for hypotension.  He has been  able to ambulate without difficulty.  He has been receiving dialysis per  his preoperative schedule.  He has had some residual mild erythema along  his right thigh that was felt stable.  His  white count has not been  elevated.  As of September 21, 2007, his WBC is 3.4, hemoglobin 10.0,  hematocrit 29.4, platelet count 186,000.  Sodium 140, potassium 3.7,  glucose 98, BUN 20, creatinine 5.64, blood albumin 2.1, calcium 8.4, and  phosphorus 4.5.  On September 21, 2007, it was felt that since his right  thigh site looked stable and cultures were negative that it would be  appropriate for him to be discharged home with continued IV antibiotics  at his dialysis center with followup with Dr. Edilia [name] in 1 week.  Currently incisions are clean, dry, and intact, and he remained  hemodynamically stable with last vitals showing blood pressure 140/78,  room air oxygen saturation 96%, he is afebrile at 98, heart rate 71, and  respirations 20.   DISPOSITION:  Anticipate [name] will be discharged home on September 21, 2007, if he is feeling well following morning hemodialysis.  Currently  remains in stable and improved condition.   DISCHARGE MEDICATIONS:  1. Prograf 1 mg 2 capsules p.o. b.i.d.  2. Prednisone 5 mg p.o. daily.  3. Synthroid 175 mcg daily.  4. Nephro-Vite 1 tablet daily.  5. Lutein 20 mg daily.  6. Salmon Oil 1000 mg b.i.d.  7. Crestor 5 mg p.o. every other day.  8. Dilantin 100 mg t.i.d.  9. Fosrenol 1000 mg p.o. t.i.d. with meals.  10.Prinivil 10 mg p.o. nightly for systolic blood pressure greater      than 160.  11.Felodipine 10 mg p.o. daily, currently on hold per renal.  12.Azathioprine 50 mg, one half tablet p.o. daily.  13.Nizatidine 150 mg daily.  14.Lumigan 1 drop in each eye daily.  15.Metoprolol 25 mg daily.  16.Oxycodone 5 mg, 1-2 tablets p.o. q.4 h. p.r.n. pain.   CURRENT HEMODIALYSIS MEDICATIONS:  1. Aranesp 60 mcg IV on Mondays at hemodialysis.  2. Zosyn 2/0.25 g IV currently in the hospital was receiving every 8      hours; however, the dialysis center will substitute with 4 tablets      and do scheduled dosing at dialysis only.  3. Vancomycin 750 mg IV with  each dialysis treatment.   DISCHARGE INSTRUCTIONS:  He will continue his preoperative diet and  increase activity slowly.  Call if he develops fever greater than 101,  increased erythema, or purulent drainage from his incision site or right  thigh region.  His wife is to assist with changing his thigh dressing daily using a dry  gauze.  He will see Dr. Waverly [name] on  September 29, 2007, for  followup or office will contact him in regards to specific time.  He  should continue his routine dialysis treatments as per kidney dialysis  center on Monday, Wednesday, and Friday.      Jerold Coombe, P.A.      [name], M.D.  Electronically Signed    AWZ/MEDQ  D:  09/21/2007  T:  09/22/2007  Job:  [id number]   cc:   [facility]

## 2010-07-17 NOTE — H&P (Signed)
[name], [name]:  [id number]   MEDICAL RECORD NO.:  [id number]          PATIENT TYPE:  INP   LOCATION:  6731                         FACILITY:  [facility]   PHYSICIAN:  Maree Krabbe, M.D.DATE OF BIRTH:  Mar 05, 1960   DATE OF ADMISSION:  03/14/2007  DATE OF DISCHARGE:                              HISTORY & PHYSICAL   REASON FOR ADMISSION:  Abdominal pain.   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  He dialyzes Monday, Wednesday, and  Friday in [place] and had a dialysis without difficulty yesterday.  He  has had the most eventful things that have happened over the past couple  of years.  Several years ago, he had an ICD placed for V-tach, which was  nonischemic, which is to say that he had a heart cath in 2005 with only  20% LAD disease.  He continued to have VT ICD shocks despite medication  and underwent a ventricular tachycardia ablation procedure in 2006,  which was successful.  It was complicated by an acute ventricular  perforation requiring a sternotomy and control the bleeding surgically.  More recently and more pertinent probably, in November of 2008 he was  admitted to [facility] (no records available) with an acute  atrioventricular hemorrhage.  He says the sequelae for him have been  some decreased vision and difficulty with short term memory.  He was  hospitalized for a month and it was complicated by pneumonia and  C.difficile infection.  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  He had an episode of headache during  dialysis in early December and had a CT scan, which showed no evidence  of any bleeding.  He is receiving tight heparin with dialysis since this  episode, and he was started on  Dilantin.   The patient now presents with a less than 24 hour history of abdominal  pain, which is periumbilical and epigastric, and nausea and dry heaves  earlier today.  No vomiting, no diarrhea, fever, chills, or sweats.  No  shortness of breath or chest pain.  Note, he has not voided, which he  says is very unusual for him.   PAST MEDICAL HISTORY:  1. ESRD as above with pancreatic-renal transplant in 2002.  2. Status post ICD and subsequent VT ablation procedure in 2006 for V-      tach.  3. Hypertension.  4. Diabetes, 35 years duration.  5. Hypothyroid.  6. Permanent pacemaker.  7. Peripheral neuropathy.  8. GERD.  9. Glaucoma.  10.Toe amputation.  11.Intracranial bleed in November of 2008 as above complicated by      pneumonia and C.diff.  12.Heart cath in November of 2005, with 20% LAD disease; otherwise,      negative.  13.Thrombectomy and revision of right AV graft in December and a  second thrombectomy.  14.Status post appendectomy.   SOCIAL HISTORY:  He lives with his wife in [place], a retired Production designer, theatre/television/film, no tobacco or alcohol use.   REVIEW OF SYSTEMS:  As above.  No dysuria, hematuria, joint swelling or  pain, no ankle swelling, no focal neurological complaints.   ALLERGIES:  1. PROTAMINE.  2. DILAUDID.  3. AVELOX.   MEDICATIONS:  1. Prograf two b.i.d.  2. Prednisone 5 a day.  3. Imuran 75 a day.  4. Lutein 20 mg daily.  5. Axid 150 q.h.s.  6. Crestor 5 mg every other day.  7. Synthroid 175 mcg daily.  8. Lumigan eye drops 0.03% OU q.h.s.  9. Prinivil 10 daily.  10.Plendil 10 daily.  11.Fish oil 2 g b.i.d.  12.Dilantin 100 mg t.i.d.  13.Fosrenol 1 g a.c. t.i.d.   PHYSICAL EXAMINATION:  GENERAL:  The patient is alert and oriented in no  acute distress, he does not look toxic.  VITAL SIGNS:  Temp 96.7, blood pressure 140/70, pulse 70, respirations  18, O2 SAT 100% on room air.  SKIN:  Warm and dry.  HEENT:  PERRLA, EOMI, throat is  clear.  NECK:  Supple with flat neck veins.  CHEST:  Clear throughout.  CARDIAC:  Regular rate and rhythm with a 2/6 systolic ejection murmur  and no rub or gallop.  ABDOMEN:  Soft and nontender to palpation, he has active bowel sounds,  no right upper quadrant tenderness, no peritoneal signs.  EXTREMITIES:  No edema, good pulses in the feet bilaterally.  NEUROLOGIC:  Alert and oriented x3, no focal memory deficits.   LABS AT [facility] EMERGENCY ROOM:  CT of the abdomen showed  gallstones and chronic diverticulosis with prominence of the wall of the  transverse and descending colon, may simply be due to decompression;  however, a colitis is not excluded.  Severe atrophy of the left kidney,  several hypodensities within that kidney, ultrasound recommended,  transplanted kidney in the left pelvis.  A soft tissue density extending  from the transplanted kidney into the right side of the pelvis most  likely a pancreatic transplant.  Mild bladder wall thickening and  extensive diverticulosis of the colon, no obvious evidence of  diverticulitis, no abscess.  Sodium 139, potassium 4.0, BUN 33,  creatinine 3.65, calcium 9.6, albumin 3.5, troponin 0.04.  EKG:  Normal  sinus rhythm with first-degree AV block, LVH with QRS widening,  nonspecific ST-T wave changes.  Bilirubin was 0.2, amylase 110, AST and  ALT are normal, alk-phos is 139, slightly elevated, and lipase of 385,  slightly elevated.  Chest x-ray:  A right-sided transvenous pacemaker  leading to the right atrium and right ventricle, enlarged heart, median  sternotomy, no pulmonary edema, perihilar bronchitic changes, no focal  consolidation.  White blood count 6.3, hemoglobin 9.0, anemia 26%,  platelets 248, INR of 1.1.   IMPRESSION:  1. Abdominal pain, uncertain etiology, possibly peptic ulcer disease      or biliary pain, a patient with known gallstones, but no      inflammatory changes on the computerized tomography of  the      gallbladder.  Examination is nonsurgical, computerized tomography      scan without abscess.  Possibly a thickened colon, which may      suggest colitis, he does have a history of recent Clostridium      difficile and this bears watching, although currently he has no      diarrhea.  2. Status post kidney-pancreas transplant in 2002 with transplanted      kidney failure back on dialysis.  He is still on full      immunosuppression for his pancreas.  3. End-stage renal disease as above on tight heparin due to number      four.  4. History of intraventricular bleed, November 2008, complicated by      pneumonia and Clostridium difficile.  5. Hypertension on two blood pressure medications.  6. Diabetes treated with pancreas transplant.  7. Gastroesophageal reflux disease on Axid.  8. History of ventricular tachycardia with implantable cardioverter-      defibrillator (ICD) and subsequent successful ablation procedure.  9. Permanent pacemaker.  10.Status post toe amputation and appendectomy.   PLAN:  Admit, right upper quadrant ultrasound in the morning, check UA  and bladder scan if needed, monitor abdominal pain, follow up CBG and  lipase in the morning, continue home meds.      Maree Krabbe, M.D.  Electronically Signed     RDS/MEDQ  D:  03/14/2007  T:  03/15/2007  Job:  [id number]

## 2010-07-17 NOTE — Assessment & Plan Note (Signed)
OFFICE VISIT   [name], [name]  DOB:  December 16, 1960                                       02/16/2008  [id number]#:[id number]   I saw the patient in the office today for followup after placement of a  left thigh AV graft by Dr. Darrick Penna on 02/02/2008.  He currently dialyzes  via a catheter.   On examination the graft has a good thrill.  The groin incision is  excoriated somewhat although it does not look infected.  There is no  cellulitis.   I have instructed him to wash the wound with soap and water and keep dry  gauze on this.  We will have him see Dr. Darrick Penna in a week or two to be  sure this is healing adequately.  Currently I do not see signs of  infection.   Di Kindle. Edilia Bo, M.D.  Electronically Signed   CSD/MEDQ  D:  02/16/2008  T:  02/17/2008  Job:  [id number]

## 2010-07-17 NOTE — Assessment & Plan Note (Signed)
[facility]                         ELECTROPHYSIOLOGY OFFICE NOTE   LEIF, LOFLIN                       MRN:          [id number]  DATE:06/09/2007                            DOB:          03-Oct-1960    Mr. Goucher returns today for followup.  He is a very pleasant middle-age  male with a history of nonischemic cardiomyopathy, class II congestive  heart failure, end-stage renal disease on hemodialysis, status post  multiple failed renal transplants who is referred back today by Dr.  Elvis Coil because of continued problems of hypotension during  dialysis.  The patient has been fatigued and weak.  He states that,  while they have tried to decrease the amount of fluid that is removed  and let his dry weight run a little bit higher, he continues to notice  episodes following dialysis where he is fatigued and weak, and his blood  pressure was low, typically under 100 mmHg.  He has had no specific  chest pain with this and no significant shortness of breath.  He has had  no intercurrent ICD therapies in terms of ICD shocks.   PHYSICAL EXAMINATION:  GENERAL:  Exam today is notable for him being a  pleasant, middle-age man who is chronically ill appearing but no acute  distress.  VITAL SIGNS:  Blood pressure was 136/82. The pulse was 85 and regular.  Respirations were 18.  Weight was 152 pounds.  NECK:  Revealed no jugular distention.  LUNGS:  Clear bilaterally to auscultation.  No wheezes, rales or rhonchi  are present.  CARDIOVASCULAR:  Exam revealed a regular rate and rhythm with normal S1-  S2.  EXTREMITIES:  Demonstrated no edema.   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 The  P waves were 3. The R waves were 12. The impedance 520 in the A and 345  in the V.  The threshold 1.75 at 0.5 in the atrium and 1.5 at 0.8 in the  right ventricle.  Battery voltage was 3 volts.  There was one episode of  VT which was terminated  with anti-tachycardic pacing therapy.  He was 6%  A paced and 12% V paced.   IMPRESSION:  1. Nonischemic cardiomyopathy.  2. Congestive heart failure.  3. Severe fatigue, weakness, and malaise following dialysis.  4. Ventricular tachycardia.   DISCUSSION:  I discussed treatment options with the patient.  I think  that, based on the fact that his symptoms of fatigue, weakness, and  hypotension persist typically for 36-48 hours following dialysis, that  he is still being run a little bit too dry.  The patient's severe  cardiomyopathy and low output state are further worsened when his  preload is reduced after volume is pulled from him in dialysis.  To this  end, I have asked that he hold off on taking metoprolol and Prinivil  with the metoprolol being held for any systolic blood pressure less than  140 and the Prinivil being held for any systolic blood pressure less  than 160.  My recommendation will be to try to allow his blood pressures  to remain a little bit higher in the 120-150 range rather than trying to  keep very tight control which results in him feeling fatigued and  lightheaded and dizzy.  Ultimately, Mr. Schlender has a fairly difficult  situation with his multiple  medical problems, and I would hope that we can work on trying to improve  the quality of his life.  Will plan to see him back in the office in  several months.     Doylene Canning. Ladona Ridgel, MD  Electronically Signed    GWT/MedQ  DD: 06/09/2007  DT: 06/09/2007  Job #: [id number]   cc:   Garnetta Buddy, M.D.

## 2010-07-17 NOTE — Assessment & Plan Note (Signed)
OFFICE VISIT   ESMOND, HINCH  DOB:  October 15, 1960                                       01/05/2008  [id number]#:[id number]   I saw the patient in the office today to discuss placement of a left  thigh AV graft.  He had infected right thigh graft removed and these  wounds have all now completely healed.  He dialyzes Mondays, Wednesdays  and Fridays so we will try to schedule his new graft on a Thursday.  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  I have discussed the fact that we will use a tapered graft to  try to prevent steal but this is certainly a risk.  He feels quite  strongly about going home the day of procedure as I think this is  reasonable as he is on top of things and has help at home.  We will  schedule his outpatient left thigh AV graft for 01/14/2008.   Di Kindle. Edilia Bo, M.D.  Electronically Signed   CSD/MEDQ  D:  01/05/2008  T:  01/07/2008  Job:  [id number]

## 2010-07-17 NOTE — H&P (Signed)
[name], BRACEWELL NO.:  [id number]   MEDICAL RECORD NO.:  [id number]          PATIENT TYPE:  INP   LOCATION:  4709                         FACILITY:  [facility]   PHYSICIAN:  Gerrit Friends. Dietrich Pates, MD, FACCDATE OF BIRTH:  1960-07-22   DATE OF ADMISSION:  04/28/2008  DATE OF DISCHARGE:                              HISTORY & PHYSICAL   PRIMARY CARE PHYSICIAN:  Dr. Modesto Charon in [place].   PRIMARY CARDIOLOGIST/ELECTROPHYSIOLOGIST:  Doylene Canning. Ladona Ridgel, MD   NEPHROLOGISTS:  Garnetta Buddy, MD and Wilber Bihari. Caryn Section, MD   CHIEF COMPLAINT:  Syncope with ICD discharge.   HISTORY OF PRESENT ILLNESS:  Mr. Frisch is a 50 year old man with a  complicated past medical history including nonischemic cardiomyopathy,  polymorphic V tach status post St. Jude ICD in November 2005, and  radiofrequency ablation of VT in 2006 who presents with syncope and ICD  discharge.  The patient reports that he was loading his water stove this  morning with wood.  He sat down to rest as he usually does after  exerting himself, and noticed he began feeling lightheaded, vision was  going dark, and he was going to get shocked up.  Next thing, he reports  remembering is waking up in his chair.  He denies any palpations, chest  pain, shortness of breath, dyspnea on exertion, chest pain with  exertion, focal numbness or weakness.  He does state that he fatigues  easily, although this has been ongoing.  He reports last shock was  approximately January 2009.   PAST MEDICAL HISTORY:  1. Nonischemic cardiomyopathy.  2. 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  3. Radiofrequency ablation of V tach in February 2006.  4. History of tamponade and sternotomy in order to control bleeding      secondary to radiofrequency ablation of V tach.  5. End-stage renal disease, on hemodialysis.  6. History of prior kidney transplant x2.  7. Status post pancreatic transplant.  8. Anemia of chronic disease secondary to hyperparathyroidism.  9. Hypothyroidism.  10.Hypertension.  11.History of intraoperative subarachnoid hemorrhage in November 2008,      currently on Dilantin for seizure precautions, asymptomatic      cholelithiasis.   ALLERGIES:  CIPRO, PROTAMINE, AVELOX, ROCEPHIN, and DILAUDID.   MEDICATIONS:  1. Prograf 2 mg p.o. b.i.d.  2. Prednisone 5 mg p.o. daily.  3. Synthroid 175 mcg p.o. daily.  4. Nephro-Vite 1 tablet p.o. daily.  5. Crestor 5 mg p.o. every other day.  6. Nizatidine 150 mg p.o. daily.  7. Azathioprine 50 mg one and a half tablets p.o. daily.  8. Salmon oil 1000 mg p.o. b.i.d.  9. Lutein 20 mg p.o. daily.  10.Fosrenol 1000 mg p.o. with meals and snacks.  11.Fosinopril 40 mg p.o. daily.  12.Lumigan eye drops 0.03% one drop in each eye daily.  13.Dilantin 100 mg p.o. q.8 h.  14.Metoprolol succinate 25 mg p.o. daily.   SOCIAL HISTORY:  The patient lives in [place] with his wife and  stepdaughter.  He denies any  tobacco use.  Does have a history of  previous heavy alcohol use, but has been clean since 1988 and denies any  illicit drug use.   FAMILY HISTORY:  Mother is alive at age 36 with hypertension and a  history of CABG x5 vessel graft in her 87s.  Father is alive at 55 with  hypertension and history of prostate cancer.  The patient has 2 brothers  and 1 sister with only hypertension to his knowledge.   REVIEW OF SYSTEMS:  As per HPI.  All other systems are reviewed and  negative.   PHYSICAL EXAMINATION:  VITAL SIGNS:  Temperature 97.9, pulse 81,  respirations 16, blood pressure 149/78, and O2 saturation 99% on room  air.  GENERAL:  This is a while male in no acute distress, older appearing  than actual age.  HEENT:  Pupils equally round and reactive to light and accommodation.  Extraocular movements intact.  Sclerae is anicteric with mild  conjunctival injection.  Oropharynx is without any erythema or exudates.   Mucous membranes are moist.  NECK:  Supple without any lymphadenopathy, thyromegaly, bruits, or JVD.  Carotid upstrokes are 2+.  LYMPHATICS:  No cervical lymphadenopathy.  CARDIOVASCULAR:  Regular rate and rhythm.  PMI is displaced laterally.  There is a sternotomy scar present.  There is a grade 3/6 systolic  crescendo-decrescendo murmur over the outflow tract.  Peripheral pulses  including dorsalis pedis, posterior tibial are not palpable, although  feet are warm.  LUNGS:  Decreased breath sounds with occasional inspiratory wheeze and  bibasilar crackles.  SKIN:  There are multiple excoriations with eschars.  ABDOMEN:  There are multiple abdominal scars from prior laparotomy and  kidney transplant.  Otherwise, it is soft and nontender with normoactive  bowel sounds.  No rebound or guarding and no appreciable  hepatosplenomegaly.  GU:  Without any CVA tenderness.  EXTREMITIES:  There is no cyanosis or edema.  There are amputated toes  on the left foot.  NEUROLOGIC:  He is awake, alert, and oriented x3.  Cranial nerves II  through XII grossly intact.  Although he does have difficulty with  incision.  Strength is 5/5 x4 extremities.  Sensation is grossly normal  to light touch.   Chest x-ray, there is a right chest AICD with 2 transvenous leads, and  sequelae of prior sternotomy.  There is no acute cardiopulmonary  process.   LABORATORY DATA:  WBC 4.7, hemoglobin 13.1, and platelets 142.  Sodium  141, potassium 4.3, chloride 103, bicarb 30, BUN 50, creatinine 6.14,  glucose 91, PT 14.9, INR 1.1.  EKG shows a normal sinus rhythm with a  first-degree AV block, right bundle-branch block, and delayed R wave  progression.   ASSESSMENT AND PLAN:  Mr. Chevalier is a 50 year old man with a complex  past medical history including nonischemic cardiomyopathy, polymorphic  ventricular tachycardia status post implantable cardioverter-  defibrillator and automatic implantable  cardioverter-defibrillator and  ventricular tachycardia ablation that presents to the emergency  department after syncopal episode and implantable cardioverter-  defibrillator discharge.  Automatic implantable cardioverter-  defibrillator was interrogated by [facility]. [facility], and reported shock for  ventricular fibrillation with a rate of approximately 300 beats per  minute.  This was an appropriate discharge, and the patient denies any  prior discharges since last one in January 2008 for ventricular  fibrillation as well.  At this point, we will admit the patient, cycle  cardiac enzymes, recheck electrolytes in the morning as well as a  TSH.  We will have EP assess the patient in the morning.  We will also make  Renal aware of the patient's admission for possible hemodialysis since  he does get a hemodialysis on Monday, Wednesday and Fridays.  Otherwise,  we will continue home medications.   Case will be reviewed with Dr. Dietrich Pates, who interviewed and examined  the patient.      Mariea Stable, MD  Electronically Signed      Gerrit Friends. Dietrich Pates, MD, [facility]  Electronically Signed    MA/MEDQ  D:  04/28/2008  T:  04/29/2008  Job:  [id number]

## 2010-07-17 NOTE — Op Note (Signed)
[name], [name]                ACCOUNT NO.:  [id number]   MEDICAL RECORD NO.:  [id number]          PATIENT TYPE:  AMB   LOCATION:  SDS                          FACILITY:  [facility]   PHYSICIAN:  Katy Fitch. Sypher, M.D. DATE OF BIRTH:  12/28/1960   DATE OF PROCEDURE:  05/19/2008  DATE OF DISCHARGE:  05/19/2008                               OPERATIVE REPORT   PREOPERATIVE DIAGNOSES:  Fungating growth, left long finger nail with  background chronic renal failure status post combined pancreas and renal  transplant performed in 2002, on chronic immunosuppression.   POSTOPERATIVE DIAGNOSES:  Fungating growth, left long finger nail with  background chronic renal failure status post combined pancreas and renal  transplant performed in 2002, on chronic immunosuppression.   FINAL DIAGNOSIS:  Pending.   Identification of histopathology.   OPERATION:  1. En bloc excisional biopsy of left long finger nail including nail      plate and nail matrix.  2. Full-thickness skin graft measuring 2 by 1.5 cm for reconstruction      of distal left long finger, skin coverage.   OPERATING SURGEON:  Katy Fitch. Sypher, MD   ASSISTANT:  Marveen Reeks Dasnoit, PA-C   ANESTHESIA:  General by LMA.   SUPERVISING ANESTHESIOLOGIST:  Angelina Ok   INDICATIONS:  [name]. [name] is a 50 year old gentleman referred  through the courtesy of Dr. Marina Gravel nephrologist for evaluation and  management of a fungating mass of the left long finger nail.  He has a  history of chronic renal failure and diabetes.  He is status post a  combined pancreas and renal transplant completed at [facility] in 2002.  He has residual function of his islet cells and  does not require exogenous insulin.   He has had a mass involving the long finger for several months.  He was  also referred for consideration of possible carpal tunnel syndrome.  He  has had multiple vascular access procedures on the left arm.  Electrodiagnostic studies confirmed a very severe motor sensory  polyneuropathy without evidence of frank carpal tunnel syndrome.   Given the predicament of his nail growth, we recommended excisional  biopsy to be certain, he is not developing some type of tumor while on  immunosuppression.   Preoperatively, he was advised of the risks and benefits of the surgery.  He understands.  He has severe peripheral vascular disease.  Any  procedure has risks of infection and possible tissue loss.  We also  explained that should this turn out to be a tumor, we might need to  perform a revision, amputation of the finger depending on the final  diagnosis with histopathology.   Preoperative questions were invited and answered in detail.  An  anesthesia consult was obtained with an Dr. Sondra Come.  General anesthesia by  LMA technique was recommended.   After informed consent [name] is brought to the operating room at  this time.   PROCEDURE:  [name]. Renaldo Fiddler was brought to the operating room and placed  in supine position up on the table.  Following an anesthesia consult  with Dr. Sondra Come, general anesthesia by LMA technique was recommended and  accepted.  He was brought to room 1 of [facility] Operating Room, placed  in supine position on the operating table and under Dr. Shon Hough direct  supervision, general anesthesia by LMA technique induced.  His vital  signs are noted be stable.  The left arm was prepped with Betadine soap  solution and sterilely draped.  No tourniquet was applied to the  brachium.  Our plan was to obtain a skin graft in the lateral brachium  due to multiple vascular access.  Procedures performed on the medial  brachium with extensive scarring.   The left long finger was gently exsanguinated with gauze wrap and a half-  inch Penrose drain was placed at the proximal phalangeal segment as a  digital tourniquet.  Procedure commenced with excisional biopsy of the  entire nail en  bloc.  The entire dorsal nail fold, radial ulnar nail  walls, hyponychium and entire nail matrix was harvested to level of the  distal phalanx.  A rongeur was used to clear soft tissues proximally to  the level of the extensor mechanism.   The distal phalanx was then repeatedly drilled with an 18 gauge needle  to obtain medullary blood supply.  The full-thickness skin graft was  harvested with fresh instruments utilizing a 10 blade.  This was thinned  with tenotomy scissors to a partial-thickness graft.  This was then  inset with 4-corner sutures of 5-0 nylon and running segmental sutures  of 6-0 chromic.   A Xeroflo pledget was placed over the graft to eliminate all dead space  followed by a gauze and Coban dressing.   Care was taken to wrap the Coban quite loosely to prevent ischemia of  the finger.   We do not have a pathologic diagnosis based on gross examination.  We  will need to await the results of histopathology by our laboratory  colleagues and/or dermatopathology colleagues.   [name] tolerated surgery under anesthesia well.  He was awakened  from anesthesia transferred to PACU with stable vital signs.  He will be  discharged home with prescriptions for doxycycline 100 mg p.o. daily x5  days as prophylactic antibiotic and Percocet 5 mg 1 p.o. q.4-6 h. p.r.n.  pain.      Katy Fitch Sypher, M.D.  Electronically Signed     RVS/MEDQ  D:  05/19/2008  T:  05/19/2008  Job:  [id number]   cc:   Wilber Bihari. Caryn Section, M.D.  Arlan Organ

## 2010-07-17 NOTE — Op Note (Signed)
NAMEMAXIMILLION, GILL                ACCOUNT NO.:  [id number]   MEDICAL RECORD NO.:  [id number]          PATIENT TYPE:  AMB   LOCATION:  SDS                          FACILITY:  [facility]   PHYSICIAN:  Janetta Hora. Fields, MD  DATE OF BIRTH:  Feb 04, 1961   DATE OF PROCEDURE:  03/02/2007  DATE OF DISCHARGE:  03/02/2007                               OPERATIVE REPORT   PROCEDURE:  Thrombectomy and revision right forearm AV graft.   PREOPERATIVE DIAGNOSIS:  Thrombosed AV graft right arm.   POSTOPERATIVE DIAGNOSIS:  Thrombosed AV graft right arm.   ANESTHESIA:  Local with IV sedation.   ASSISTANT:  Nurse.   OPERATIVE FINDING:  Narrowing of venous anastomosis.   OPERATIVE DETAILS:  After obtaining informed consent, the patient was  taken to the operating room.  The patient was placed in the supine  position on the operating room table.  After adequate sedation, the  patient's entire right upper extremity was prepped and draped in usual  sterile fashion.  Local anesthesia was infiltrated at a preexisting  transverse scar in the antecubital region.  Incision was reopened,  carried down through subcutaneous tissues down to the level of the  graft.  Arterial limb of the graft was on the radial aspect of the arm.  The angio portion of this was dissected free and then left alone.  The  ulnar aspect of the graft which was the venous limb, was then dissected  free circumferentially.  Dissection was carried up to level of the  venous anastomosis.  The patient was then given 5000 units of  intravenous heparin.  A transverse graftotomy was made just above the  level of venous anastomosis.  A #4 Fogarty catheter was then used to  thrombectomize the venous limb of the graft.  There was some venous  backbleeding at this point.  However, on probing the anastomosis, this  accepted a #3 dilator but would not take a #4 dilator.  At this point,  an additional incision was made in longitudinal fashion just above  the  transverse incision.  Incision was carried down through the subcutaneous  tissues down to the level of vein.  Vein was dissected free  circumferentially.  It was of good quality in this region and was  approximately 4 mm in diameter.  Small side branches were ligated and  divided between silk ties.  Vein was then controlled proximally.  The  distal end of the previous venous anastomosis was oversewn with a  running 5-0 Prolene suture.  A new 6 mm PTFE graft was brought up on the  operative field and tunneled subcutaneously under the skin bridge.  The  vein was then controlled proximally and distally with fine bulldog  clamps.  The vein was opened longitudinally and then transected.  The  graft was beveled and sewn end graft to end of vein using a running 6-0  Prolene suture.  At completion anastomosis, this was backbled thoroughly  flushed with heparinized saline.  Graft was then cut to length for  anastomosis to more proximal portion of the graft.  The  arterial limb of  the graft was easily thrombectomized and arterialized plug was retrieved  and there was good arterial inflow.  It should be noted that the patient  was only given local heparinized saline and no systemic heparin due to a  recent intracranial bleed.  The arterial limb was clamped proximally.  The new interposition graft was then cut to length and sewn end-to-end  to the old graft using a running 6-0 Prolene suture.  Just prior to  completion of the anastomosis, this was forebled and there was some  retained clot at this point.  This was again thrombectomized and good  arterial inflow was achieved.  The remainder of the anastomosis was then  completed, everything was forebled, backbled and thoroughly flushed.  Anastomosis was secured, clamps were released, there was a palpable  thrill above the graft immediately.  Next, the subcutaneous tissues were  reapproximated using running 3-0 Vicryl suture.  Skin was closed with  4-  0 Vicryl subcuticular stitch in both  incisions.  The patient tolerate procedure well and there were no  complications.  Sponge, needle and instrument counts were correct at the  end of the case.  The patient had a palpable radial pulse at the end of  the case.  The patient was taken to the recovery room in stable  condition.      Janetta Hora. Fields, MD  Electronically Signed     CEF/MEDQ  D:  03/03/2007  T:  03/03/2007  Job:  [id number]

## 2010-07-17 NOTE — H&P (Signed)
NAMEGIOVONNI, POIRIER NO.:  [id number]   MEDICAL RECORD NO.:  [id number]          PATIENT TYPE:  INP   LOCATION:  3707                         FACILITY:  [facility]   PHYSICIAN:  Vernice Jefferson, MD          DATE OF BIRTH:  05-Jul-1960   DATE OF ADMISSION:  03/17/2007  DATE OF DISCHARGE:                              HISTORY & PHYSICAL   CARDIOLOGIST:  Doylene Canning. Ladona Ridgel, M.D.   CHIEF COMPLAINT:  I passed out.   HISTORY OF PRESENT ILLNESS:  Patient is a 50 year old white male with  nonischemic cardiomyopathy.  Last EF of approximately 45% .  A history  of monomorphic VT, status post ablation in 2006 with placement of ICD,  who presents to [facility] after a syncopal episode today.  He said he  was working out in the yard, does not remember having any preceding  symptoms.  He denies any chest pain, palpitations, diaphoresis, and then  found himself on the ground.  He did not have any incontinence.  From  what he can tell, there was no tongue-biting.  There were no witnesses  to the event.  On interrogation of his device here in the emergency  room, he is found to have an appropriate ICD cardioversion secondary to  polymorphic VT versus VF.  Currently, patient has no complaints.  He has  denied any recent anginal symptoms.  He has denied any recent worsening  heart failure symptoms.  He has been in the hospital recently for a  lower GI complaint with diarrhea and abdominal pain.  He has not been  very active.   PAST MEDICAL HISTORY:  1. History of nonischemic cardiomyopathy of 40-45%.  2. History of monomorphic VT ablation in 2006, status post ICD.  3. End-stage renal disease, status post failed transplant, now on      dialysis.  4. Status post recent intracranial hemorrhage.  5. Status post diabetes mellitus.  6. Hypertension.  7. History of GERD.   SOCIAL HISTORY:  He lives in [place].  He lives with his wife.  There  is no alcohol, no drugs, no tobacco abuse.   FAMILY HISTORY:  Reviewed and noncontributory to patient's current  medical condition.   MEDICATIONS:  1. Prograf 2 tabs b.i.d.  2. Prednisone 5 mg 1 daily.  3. Imuran 75 mg at night.  4. Lutein 20 mg a day.  5. Crestor 5 mg a day.  6. Synthroid 175 mg a day.  7. Prinivil 10 mg daily.  8. Plendil 10 mg daily.  9. Fish oil 2 gm b.i.d.  10.Dilantin 100 mg t.i.d.  11.Fosrenol 1 gm daily.   REVIEW OF SYSTEMS:  Negative review of systems, otherwise as dictated in  the above HPI.   PHYSICAL EXAMINATION:  Blood pressure 155/81, heart rate 93, respiratory  rate 12, afebrile.  GENERAL:  A well-developed and well-nourished white male in no acute  distress.  HEENT:  Moist mucous membranes.  No scleral icterus.  No conjunctival  pallor.  NECK:  Supple.  Full range of motion.  No jugular  venous distention.  CARDIOVASCULAR:  A 2/6 systolic ejection murmur at the right upper  sternal border with a regular rate and rhythm.  LUNGS:  Right basilar rales but otherwise clear to auscultation  bilaterally.  ABDOMEN:  Nontender, nondistended.  Normoactive bowel sounds.  EXTREMITIES:  No peripheral edema.  Pulses are 2+ bilaterally.  NEURO:  Nonfocal.   EKG personally reviewed by me demonstrates a normal sinus rhythm with  LVH and early repolarization abnormality, consistent with old EKGs.   Laboratory data is significant for a hemoglobin of 8.5, prior hemoglobin  of 9.  Metabolic panel is significant for a BUN of 29, creatinine 5.8,  and glucose of 80.  The first set of cardiac biomarkers within normal  limits.   IMPRESSION:  1. Syncope -  s/p implantable cardioverter/defibrillator fire      secondary to what appears to be either polymorphic ventricular      tachycardia or ventricular fibrillation.  Heart rate is in the      range of 300 and appears irregular, at least by the intracardiac      electrogram.  2. End-stage renal disease, status post transplant.  3. Diabetes.  4.  Hypertension.   The plan for tonight is just medical management of this arrhythmia with  beta blocker and aspirin.  May consider further risk stratification in  the a.m., given this possibly could be VF, even though he has had a  normal heart catheterization in the past.  May consider new ischemic  etiology for this arrhythmia.  Will order a 2D echocardiogram.  Want to  keep him n.p.o. and continued on his meds for diabetes and renal  transplant.      Vernice Jefferson, MD  Electronically Signed     JT/MEDQ  D:  03/18/2007  T:  03/18/2007  Job:  [id number]

## 2010-07-17 NOTE — H&P (Signed)
NAMETAITEN, BRAWN NO.:  [id number]   MEDICAL RECORD NO.:  [id number]          PATIENT TYPE:  INP   LOCATION:  3304                         FACILITY:  [facility]   PHYSICIAN:  Terrial Rhodes, M.D.DATE OF BIRTH:  Sep 17, 1960   DATE OF ADMISSION:  09/02/2007  DATE OF DISCHARGE:                              HISTORY & PHYSICAL   REASON FOR ADMISSION:  Fever and cough with blood-tinged sputum.   HISTORY OF PRESENT ILLNESS:  Mr. Liptak is a very pleasant 50 year old  Caucasian male with a past medical history that is significant for end-  stage renal disease currently on hemodialysis Monday, Wednesday, and  Friday at [facility].  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  He also has a history of hypothyroidism,  nonischemic cardiomyopathy.  He is status post implant defibrillator by  Dr. Ladona Ridgel, history of nonobstructive coronary artery disease and  secondary hyperparathyroidism.   The patient was in his usual state of health until he awoke early a.m.  today with sudden onset of productive cough with white and blood-tinged  sputum.  Noted to have shaking chills during the night.  His wife gave  him 2 Tylenol and 2 hours later, the patient spiked a temperature of  104.0.  Wife brought the patient into the emergency room for workup.  In  the emergency room, the patient complains of thigh myalgias.  No joint  pain.  Some swelling to his left ear after he states a bee stung him  yesterday.  He complains of some right-sided chest soreness, no  radiation, no dyspnea.  He has ongoing nausea that started this morning  and emesis x1 since arrival to the emergency room.  He has no urinary  dysuria, frequency, or burning.  No upper respiratory symptoms.   Wife does report flu-like symptoms last week with fever and  myalgias,  was seen at the urgent care in [place] and states her flu test was  negative.   In the emergency room, the patient's temperature was 103.1.  Chest x-ray  with suspected right lower lobe pneumonia.  White blood cell count 10.3  and on respiratory precautions.  Zithromax 500 mg IV was given as well  as Rocephin.   PAST MEDICAL HISTORY:  See above also.  1. 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      a.     Status post renal transplant in 1998, with rejection in       1998.      b.     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      c.     Resumed hemodialysis October 08, 2006.  2. Hypothyroidism.  3. Erectile dysfunction.  4. Amputation of left third toe.  5. Hypertension.  6. History of polymorphic ventricular tachycardia status post      implantable defibrillator November 2005, by  Dr. Sharrell Ku.  7. History of nonischemic cardiomyopathy.  8. History of tamponade and sternotomy with controlled bleeding      secondary to radiofrequency ablation of ventricular tachycardia.  9. Cardiac catheterization January 10, 2004, revealed nonobstructive      coronary artery disease.  10.History of peptic ulcer disease and asymptomatic cholelithiasis.  11.Anemia.  12.Secondary hyperparathyroidism.  13.History of intraventricular hemorrhage complicated by pneumonia and      C. difficile in November 2008.  Placed on Dilantin at that time for      seizure precautions and that has never been deceased.  14.History of syncopal episode and ventricular fibrillation in January      2009, status post ICD placement March 17, 2007.   ALLERGIES:  To CIPRO, PROTAMINE, and AVELOX.   FAMILY HISTORY:  Noncontributory.   SOCIAL HISTORY:  The patient currently lives with his wife in [place].  He is a retired [employer].  Denies tobacco, alcohol or drug  abuse.   REVIEW OF SYSTEMS:  Please see  HPI.  Also note.  General, no change in  weight or appetite.  No history of polydipsia.  No diabetes since  pancreas transplant.  Remainder of symptoms, please see HPI.   PHYSICAL EXAMINATION:  Vital Signs:  Temperature max 103.2, blood  pressure 93/54, heart rate 92, respirations 18, oxygen saturation 95-  100% on room air.  GENERAL:  Mr. Enfield is a 50 year old Caucasian male  who appears ill.  He is hot to touch.  He is alert and cooperative to  questions and oriented x3.  NECK:  Neck is supple.  No noted JVD.  CAROTIDS:  No carotid bruits bilaterally.  HEART:  Regular rate and rhythm.  S1 and -S2 no murmurs, gallops or rubs  were auscultated.  LUNGS:  Noted right mid to right base crackles, faint, no wheeze.  Decreased breath sounds left extreme bases.  ABDOMEN:  Positive bowel sounds, soft, nontender to palpation in all  quadrants.  No rebound, no guarding.  Transplanted pancreas right lower  quadrant is nontender and transplanted kidney left lower quadrant  nontender.  EXTREMITIES:  No bilateral lower extremity edema.  The patient with  right forearm AV graft positive thrill and bruit.  No active signs of  infection.   ADMISSION LABS:  WBC 10.3, hemoglobin 15.5, platelets 153,000, potassium  5.2, glucose 84, BUN 46, albumin 3.6, calcium 9.5.   ASSESSMENT AND PLAN:  As discussed with Dr. Wilmon Pali who is in  to see and evaluate this patient.  1. Fever and hemoptysis with right lower lobe infiltrate by chest x-      ray.  Plan to admit the patient to the Renal Floor to a private      room.  The patient is immunosuppressed secondary to transplant and      will be placed on droplet precautions.  We will also panculture.      Continue IV Zithromax for now.  Comfort measures to include cough      suppressants.  Check influenza A and B & H1 and 1.  Follow-up chest      x-ray in the a.m. attempt to do PA and lateral.  2. Status post pancreas transplant.  Continue  immunosuppressive      therapy.  3. End-stage renal disease Monday, Wednesdays and Fridays.  We will do      hemodialysis today as a separate via his right AVGG.  No pleural      edema by  chest x-ray.  4. Anemia.  No issues.  Hemoglobin at goal.  No current medications.  5. Secondary hyperparathyroidism.  Continue vitamin D and binders.  6. Hypothyroidism.  Continue Synthroid.  7. Hypertension.  The patient was slightly hypotensive in the      emergency room, hold blood pressure medicines at this time.  8. Cardiac issues to include ischemic cardiomyopathy, nonobstructive      coronary artery disease.  This is stable.  No intervention for now.  9. History of intraventricular hemorrhage in November 2008.  Continue      the patient's Dilantin he has been on this since his bleed for      seizure precautions.  Question if the patient will need to continue      this or this can be discontinued.  I will reevaluate as an      outpatient.      Azucena Fallen, PA    ______________________________  Terrial Rhodes, M.D.    MY/MEDQ  D:  09/02/2007  T:  09/03/2007  Job:  [id number]

## 2010-07-17 NOTE — Assessment & Plan Note (Signed)
[facility]                         ELECTROPHYSIOLOGY OFFICE NOTE   [name], [name]                       MRN:          [id number]  DATE:04/02/2007                            DOB:          May 20, 1960    [name] returns today for followup.  He is a very pleasant middle-  aged man with a history of nonischemic cardiomyopathy, congestive heart  failure, end-stage renal disease, on dialysis, previously status post  renal transplant with failure of his transplant.  The patient has a  history of ventricular tachycardia, status post ICD insertion, but then  developed multiple intercurrent ICD shocks and underwent VT ablation  with successful ablation; unfortunately, this procedure was complicated  by pericardial effusion and because of his allergy to protamine,  ultimately underwent a pericardiocentesis followed by a median  sternotomy to control his effusion.  In the interim, the patient was  hospitalized with an episode of syncope and was found to have an episode  of ventricular fibrillation.  There was only 1 episode and he has had  none since.  Discussion was made during his hospitalization as to  whether or not to proceed with left heart catheterization; however, the  patient had had no chest pain and did not rule in for an MI and has had  no worsening heart failure symptoms.  In the past he had not had any  known significant coronary artery disease and with all of the above,  decision was made not to proceed with catheterization.  I should note  that the patient is scheduled for followup at [facility] for  reconsideration of kidney transplant and for additional cardiac  evaluation.   PHYSICAL EXAMINATION:  On exam today, he is a pleasant, chronically ill-  appearing middle-aged man in no acute distress.  The blood pressure was  122/66, the pulse was 78 and regular, respirations were 18 and the  weight was 148 pounds.  NECK:  Revealed no  jugular venous distention.  There was no thyromegaly.  LUNGS:  Clear bilaterally to auscultation.  No wheezes, rales or rhonchi  were present.  CARDIOVASCULAR:  Regular rate and rhythm with normal S1 and S2.  There  was a soft S3 gallop.  The PMI was enlarged and laterally displaced.  ABDOMEN:  Soft, nontender and nondistended.  There was no organomegaly.  The bowel sounds were present.  There was no rebound or guarding.  EXTREMITIES:  Demonstrated no cyanosis, clubbing or edema.  The pulses  were 2+ and symmetric.  The fistula on the right arm was working  normally.   MEDICATIONS:  1. Multiple vitamins.  2. Synthroid 175 mcg daily.  3. Crestor 5 mg a day.  4. Prinivil 10 mg p.r.n.  5. Metoprolol 425 mg q.12 h.  6. Dilantin 100 mg t.i.d.  7. Axid 150 mg daily.   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  The P waves were greater than 3 and the R waves were greater than 12.  The impedance was 465 in the A and 310 in the V.  The threshold was 1.25  at 0.5 in the A and 2.25 at 0.8 in the RV.  The battery voltage was 3.05  volts.  The underlying rhythm was sinus.  There were no intercurrent ICD  therapies.   IMPRESSION:  1. Nonischemic cardiomyopathy.  2. Congestive heart failure.  3. Ventricular tachycardia, status post implantable cardioverter-      defibrillator therapy.  4. End-stage renal disease, now on dialysis, previously status post      kidney transplant.   DISCUSSION:  [name] is stable today, but I am concerned about his  borderline blood pressures and overall failure to thrive.  He is  scheduled be seen by the kidney doctors as well as cardiologist at [facility].  I will see him back in the office in several months.     Doylene Canning. Ladona Ridgel, MD  Electronically Signed    GWT/MedQ  DD: 04/02/2007  DT: 04/03/2007  Job #: [id number]

## 2010-07-20 NOTE — Op Note (Signed)
NAMECONLEY, [name]:  [id number]   MEDICAL RECORD NO.:  [id number]          PATIENT TYPE:  INP   LOCATION:  2305                         FACILITY:  [facility]   PHYSICIAN:  Duke Salvia, M.D.  DATE OF BIRTH:  1960-05-07   DATE OF PROCEDURE:  DATE OF DISCHARGE:                                 OPERATIVE REPORT   PREOPERATIVE DIAGNOSIS:  Ventricular tachycardia - syncopal.   POSTOPERATIVE DIAGNOSIS:  Ventricular tachycardia - syncopal.   PROCEDURE:  Aborted dual-chamber defibrillator implantation.   After obtaining informed consent, the patient was then prepared for ICD  implantation.  After routine prep and drape of the left upper chest,  lidocaine was infiltrated in the prepectoral subclavicular region.  An  incision was made and carried down over the layer of the prepectoral pocket  using electrocautery and sharp dissection.  A pocket was formed similarly.   Thereafter, attention was turned to getting access to the extrathoracic left  subclavian vein which was accomplished with moderate difficulty.  Ultimately, a venogram was undertaken to assist this.  This allowed for  cannulation of the subclavian vein.  However, we were unable to pass either  a Wholey wire or a glide wire.  Repeat venogram demonstrated there was  occlusion halfway across the innominate.  At this point, the procedure was  aborted.  The  pocket was closed with opposition of the anterior and posterior aspects of  the pocket.  The wound was then closed after antibiotic flushing in three  layers.  The patient was then transferred back to the floor, and ICD  implantation will be undertaken tomorrow from the contralateral side.       SCK/MEDQ  D:  01/10/2004  T:  01/11/2004  Job:  [id number]   cc:   Modesto Charon  [address]  [place]  [place] [address]  Fax: [phone]   Mickey __________, M.D.  [place]  [facility]

## 2010-07-20 NOTE — Discharge Summary (Signed)
[name], [name]:  [id number]   MEDICAL RECORD NO.:  [id number]          PATIENT TYPE:  INP   LOCATION:  5532                         FACILITY:  [facility]   PHYSICIAN:  Garnetta Buddy, M.D.   DATE OF BIRTH:  01-Jun-1960   DATE OF ADMISSION:  01/28/2006  DATE OF DISCHARGE:  02/02/2006                               DISCHARGE SUMMARY   ADMITTING DIAGNOSES:  1. Nausea, vomiting, diarrhea, and fever.  2. Acute on chronic renal failure of kidney transplant secondary to      hypovolemia dehydration.  3. Status post kidney and pancreas transplant 2002.  4. Hypertension.  5. Hypothyroidism.  6. Asymptomatic gallstones.   DISCHARGE DIAGNOSES:  1. Nausea, vomiting, diarrhea, and fever resolved. Felt secondary to      viral gastroenteritis. Workup otherwise negative.  2. Acute on chronic renal failure of transplant kidney improved with      IV hydration.  3. Status post kidney and pancreas transplant 2002.  4. Hypertension.  5. Hypothyroidism.  6. Asymptomatic gallstones.  7. Bronchitis versus pneumonia. To complete 10-day course of Avelox.   BRIEF HISTORY:  A 50 year old Caucasian male with extensive past medical  history including status post cadaveric renal transplant 1988 with a  rejected transplant nephrectomy and most recently a kidney-pancreas  transplant 2002 with chronic allograft nephropathy biopsy-proven  September 2007. He is followed at [facility] and  presents to the office on the day of admission complaining of a  nonproductive cough approximately three weeks ago developing green  sputum one day prior to the office visit. He also says that in the last  24 hours he has developed chills, vomiting, diarrhea, fever as high as  102-104. On the morning of admission he awoke with incontinent episodes  of diarrhea and lumbar back pain. He is being admitted for further  evaluation and IV fluid.   LABORATORIES ON ADMISSION:  Sodium 142,  potassium 4.8, chloride 107,  bicarb 21, BUN 76, creatinine 4.1, glucose 111. Calcium 9.4. Total  bilirubin 1. Albumin 3.2. SGOT 31, SGPT 22. Alk phos 58. Creatinine 4.4.   HOSPITAL COURSE:  1. Renal. On admission the patient's immunosuppressant therapy was      continued using Prograf and Imuran. Stress doses of steroids were      given in the form of Solu-Medrol. The patient's      hydrochlorothiazide, Plendil, aspirin, Crestor were held. IV fluids      were administered. IV Avelox was started. Within 24 hours the      patient was afebrile. His vital signs remained stable throughout.      Urine output tapered down slightly with questionable accuracy of      ins and outs.   Over the course of this hospitalization his creatinine remained  relatively stable coming down only to 4.2 after five days in the  hospital. On the day prior to discharge aggressive IV fluid hydration  was restarted at 125 cc an hour of normal saline. By the time he was  discharged he had received approximately 2.5 liters of normal saline,  and his repeat creatinine had dropped down to 38 with a drop in his BUN  to 84. Urine output had picked up to approximately1000 cc eight hour  shift. He was discharged home to follow up in 48 hours with [facility] to have repeat chemistries done to continue monitoring  creatinine. He was encouraged to push p.o. liquids at home.   Workup included a Prograf level which was slightly subtherapeutic at 4.5  with a normal level of 5-20. Blood and urine cultures remained negative.  Eventually Solu-Medrol was changed back to his usual Medrol dosing of 10  mg daily. His baseline creatinine is reported at approximately 2.7. He  will be followed up on December 4 in the office.  1. Gastroenteritis. His symptoms were treated with antiemetics and      antidiarrheals. The workup included a negative Giardia, negative      Cryptosporidia, negative Clostridium difficile. Abdominal       ultrasound confirmed right transplant nephrectomy, left fossa      transplant kidney unremarkable, cholelithiasis without acute      cholecystitis. His symptoms resolved within 48 hours, and he was      taking in oral liquids and food without problems at time of      discharge.  2. Pulmonary:  The patient was empirically started on IV Avelox on      admission after sputum cultures were obtained. Normal flora grew.      Legionella titer was negative. Chest x-rays were reporting      congestive heart failure on admission, but as mentioned diuretics      were held due to the patient's rise in creatinine and symptoms of      GI loss of fluids. Chest x-ray last done on November 30 showed much      improved lung aeration with resolving _________ atelectasis. There      was minimal patchy air space opacity in the right lower lobe but no      definite infiltrate. The patient will nevertheless complete a 10-      day course of Avelox. He was given prescription at time of      discharge and should complete the dosing on December 7.   DISCHARGE MEDICATIONS:  1. Betoptic eye drops 0.25% one drop each eye b.i.d.  2. Prograf 2 mg b.i.d.  3. Imuran 75 mg daily.  4. Sodium bicarbonate 1300 mg t.i.d.  5. Pepcid 20 mg daily.  6. Synthroid 125 mcg daily.  7. Medrol 10 mg daily.  8. Baby aspirin 81 mg daily.  9. Calcitriol 0.25 mcg daily.  10.Zocor 20 mg daily.  11.Plendil 5 mg daily.  12.Avelox 400 mg daily x5 more days.  13.The patient was instructed to continue holding hydrochlorothiazide      at 12.5 mg daily until further notice.   He will follow up with [facility] December 4 for repeat  chemistries.      Zenovia Jordan, P.A.      Garnetta Buddy, M.D.  Electronically Signed    RRK/MEDQ  D:  02/02/2006  T:  02/03/2006  Job:  [id number]

## 2010-07-20 NOTE — Cardiovascular Report (Signed)
[name], [name]:  [id number]   MEDICAL RECORD NO.:  [id number]          PATIENT TYPE:  INP   LOCATION:  2305                         FACILITY:  [facility]   PHYSICIAN:  Charlton Haws, M.D.     DATE OF BIRTH:  1960-04-30   DATE OF PROCEDURE:  01/10/2004  DATE OF DISCHARGE:                              CARDIAC CATHETERIZATION   PROCEDURE:  Cardiac catheterization.   CARDIOLOGIST:  Charlton Haws, M.D.   INDICATIONS FOR PROCEDURE:  The patient is a 50 year old male, a patient of  Dr. Duke Salvia, Dr. Jonelle Sidle and Dr. Cliffton Asters in [place].  He  was admitted with a rapid wide complex tachycardia causing syncope.  He is a  longstanding diabetic despite two renal transplants and a creatinine of 1.9.  Dr. Diona Browner and Dr. Graciela Husbands felt it is imperative to do a diagnostic cardiac  catheterization to rule out coronary artery disease in the setting of  syncope and wide complex tachycardia.   DESCRIPTION OF PROCEDURE:  A cine heart catheterization was done from the  right femoral artery, and 37 mL of Visipaque was used.   RESULTS:  1.  LEFT MAIN CORONARY ARTERY:  The left main coronary artery was normal.  2.  LEFT ANTERIOR DESCENDING CORONARY ARTERY:  The left anterior descending      coronary artery had 20% multiple discrete lesions in the mid and distal      portion.  There was a small intermediate branch without focal stenosis.  3.  CIRCUMFLEX CORONARY ARTERY:  The circumflex coronary artery was dominant      and normal.  There was calcification of the left anterior descending      coronary artery and the left main coronary artery, but no critical      stenosis.   IMPRESSION:  The patient has no significant coronary artery disease.   PLAN:  He will have his EP evaluation later today.  Bicarbonate protocol was  adhered to, and he will be hydrated overnight.  Will check his creatinine in  the morning.       PN/MEDQ  D:  01/10/2004  T:  01/10/2004  Job:   [id number]   cc:   Dr. Nolon Nations, [place]   Duke Salvia, M.D.   Jonelle Sidle, M.D. [facility]

## 2010-07-20 NOTE — Discharge Summary (Signed)
[name], TRIER NO.:  [id number]   MEDICAL RECORD NO.:  [id number]          PATIENT TYPE:  INP   LOCATION:  2003                         FACILITY:  [facility]   PHYSICIAN:  Evelene Croon, M.D.     DATE OF BIRTH:  1962/52/14   DATE OF ADMISSION:  04/15/2004  DATE OF DISCHARGE:  04/25/2004                                 DISCHARGE SUMMARY   ADMISSION DIAGNOSIS:  Internal defibrillator discharge.   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   Other surgical history includes a toe amputation and multiple dialysis  grafts in bilateral upper extremities.   This patient is allergic to PROTAMINE.   DISCHARGE DIAGNOSES:  1.  Recurrent ventricular tachycardia, status post ventricular ablation.  2.  Pericardial effusion with tamponade, status post median sternotomy and      drainage of hemopericardium.   BRIEF HISTORY:  [name] is a 50 year old Caucasian man.  He presented to  the [facility] Emergency Department on April 15, 2004 after several  firings of his internal cardiac defibrillator.  He was evaluated in the  emergency department by Dr. Vida Roller.  Dr. Dorethea Clan consulted with Dr.  Ladona Ridgel who recommended starting him on sotalol to try and control the V tach  and plans were made for evaluation by electrophysiologist the morning of  February 13.   HOSPITAL COURSE:  On April 15, 2004 [name] was admitted to [facility] under the care of [facility] Cardiology service.  Because  of his history of chronic renal insufficiency and  renal transplant, renal  service was notified of his admission.  The morning of February 13, Mr.  Price was evaluated by Dr. Lewayne Bunting.  Dr. Lubertha Basque impression was that  of recurrent V tach despite sotalol.  Dr. Ladona Ridgel recommended proceeding with  radiofrequency ablation of the left ventricle.  This procedure, risks, and  benefits were all discussed with [name].  He agreed to proceed.   On April 17, 2004, Mr. Lizer underwent the following procedure with Dr.  Lewayne Bunting:  Radiofrequency ablation of left ventricle for ventricular  tachycardia.  Post procedure, the patient developed hypotension and a 2-D  echocardiogram revealed large pericardial effusion with symptoms of  tamponade.  Dr. Ladona Ridgel performed emergent pericardiocentesis withdrawing 700  mL of bloody fluid. He had immediate hemodynamic improvement.  Drain was  left in place.  He continued to bleed over the next several hours and 800 mL  more of bloody fluid was removed through the catheter drain.  Cardiothoracic  surgery consultation was requested and he was seen later in the evening by  Dr. Evelene Croon.  After examination of the patient and review of all the  available records including reviewing his coagulation status, Dr. Laneta Simmers  felt that the best course of action would be to proceed for emergent  exploration of his mediastinum to drain his hemopericardium and ligate any  bleeding sites.  This procedure, the risks, and benefits were discussed with  [name].  He agreed to proceed.   On April 18, 2004 in the early morning hours, [name] was taken  urgently to the operating room and underwent the following surgical  procedure:  Median sternotomy and drainage of hemopericardium.  He tolerated  this procedure well transferring in stable condition to the SICU.  He  remained hemodynamically stable in the immediate postoperative period.  He  was extubated later in the morning on postoperative day #1.  He  awoke from  anesthesia neurologically intact.  [name] did have a rise in his  creatinine to a maximum of 3.0 in the postoperative.  Renal service has been  following his progress.  He also had some anemia and decreased urine output.  He was transfused.  His urine output did pick up rather quickly.  His  potassium also climbed and he was treated with some Kayexalate.  Over the  next days, Mr. Ayars' general condition improved.  His creatinine has been  trending back down to his baseline of approximately 1.4.  From a high of 3.4  on February 19, he dropped to 3 on February 20 and 2.7 on February 21.  His  urine output has remained excellent.  It is anticipated his creatinine will  continue to trend downwards.   [name] is making good progress in recovering from his surgery.  This  morning, April 24, 2004, postoperative day #6 on morning rounds Mr.  Price reports feeling very well.  He is anxious to be discharged home.  His  vital signs are stable.  His blood pressure is 141/77.  He is afebrile.  His  room air saturation is 100%.  His heart is maintaining normal sinus rhythm  at 83 beats/minute. His lungs did have a few bibasilar crackles this  morning.  He is tolerating 75-100% of his meals.  His bowel and bladder  function is within normal limits for him.  His incision is healing well.  He  is ambulating independently in his room.  His pain is adequately controlled.  [name] has been evaluated by physical therapy and occupational therapy  services and does not require any home health services.  If he continues to  progress it is anticipated he will be ready for discharge home tomorrow  April 25, 2004.   LABORATORY STUDIES:  On February 21, CBC white blood cell 7.5, hemoglobin  10.3, hematocrit 29.4, platelets 254.  Chemistries include sodium 143,  potassium 4.1, BUN 77, creatinine 2.7, glucose 97.  Please note that his  labs will be repeated tomorrow prior to  discharge.  CONDITION ON DISCHARGE:  Improved.   INSTRUCTIONS ON DISCHARGE:   MEDICATIONS:  1.  Enteric coated aspirin 325 mg daily.  2.  Toprol XL 50 mg daily.  This is a new medicine for him.   He is to resume his home medicines of:  1.  Lipitor 5 mg daily.  2.  Synthroid 125 mcg daily.  3.  Imuran 75 mg daily.  4.  Prograf 3 mg b.i.d.  5.  Prednisone 5 mg daily.  6.  Betoptic eye drops left eye daily, right eye b.i.d.  7.  Axid 150 mg daily.  8.  Os-Cal plus D 500 mg b.i.d.   For pain  management he may have Tylox 1-2 p.o. q.4h p.r.n. for moderate to  severe pain or Tylenol 325 mg 1-2 p.o. q.4h for mild pain.   ACTIVITY:  He has been asked to refrain from any driving or heavy lifting.  He is also instructed to continue his breathing exercises and daily walking.   His diet should continue to be a low fat, low salt diet.   WOUND CARE:  He is to shower daily with mild soap and water.  If his  incision shows any sign of infection, he is to call Dr. Sharee Pimple office.   FOLLOW UP:  It is has been recommended for him to follow up with Dr. Georgiann Mccoy,  his cardiologist, in approximately two weeks.  Dr. Laneta Simmers would like to see  him back in the CVTS office on Tuesday, March 14 at 11:30 a.m.  He will be  asked to have a chest x-ray at [facility] that morning at 10:30 and  bring his x-ray for Dr. Laneta Simmers to review.  Dr. Ladona Ridgel would like to follow  up with the patient on Tuesday, March 28 at 10:30 in the morning.  Mr.  Vandergriff should continue his routine followup with Dr. Caryn Section.  He has an  appointment scheduled on March 3.  However, due to personal reasons this  will need to be rescheduled and the patient will do this.      CTK/MEDQ  D:  04/24/2004  T:  04/24/2004  Job:  [id number]   cc:   Doylene Canning. Ladona Ridgel, M.D.   Richard F. Caryn Section, M.D.  [address]  [place]  [place] [address]  Fax: [phone]   Harl Bowie, M.D.  [address]  [place]  [place] [address]  Fax: [phone]

## 2010-07-20 NOTE — Discharge Summary (Signed)
NAMEMARKUS, [name]:  [id number]   MEDICAL RECORD NO.:  [id number]          PATIENT TYPE:  INP   LOCATION:  2034                         FACILITY:  [facility]   PHYSICIAN:  Olga Millers, M.D. [facility] OF BIRTH:  Jan 24, 1961   DATE OF ADMISSION:  04/05/2004  DATE OF DISCHARGE:  04/09/2004                                 DISCHARGE SUMMARY   DISCHARGE DIAGNOSES:  1.  Admitted April 05, 2004 with recurrent cardioversion defibrillator      discharges secondary to ventricular tachycardia.  2.  Started sotalol therapy for recurrent ventricular tachycardia.  3.  History of syncope/inducible monomorphic ventricular tachycardia, status      post implantation of St. Jude cardioverter defibrillator in November,      2005.   SECONDARY DIAGNOSES:  1.  Hypothyroidism.  2.  Type I diabetes.  3.  Status post kidney/pancreas transplant in December, 2002.  4.  Gastroesophageal reflux disease for chronic renal insufficiency with      admission creatinine of 2.2 and discharge creatinine of 2.1.   PROCEDURE:  None this admission.  Patient was ruled out for myocardial  infarction on the basis of a series of cardiac enzymes.  He was started on  sotalol antiarrhythmic syndrome with successful load and tolerance.   DISCHARGE DISPOSITION:  [name] goes home on hospital day #5.  He  is in a regular rate and rhythm.  He is A-pacing.  He is successfully  tolerating sotalol.  He is afebrile.  Vital signs are stable.   DISCHARGE MEDICATIONS:  He will discharge on the following medications:  1.  Sotalol 120 mg daily, taking the place of Toprol.  2.  Prograf 1 mg tablets.  He takes 3 tablets twice daily.  3.  Prednisone 5 mg daily.  4.  Synthroid 125 mcg daily.  5.  Azathioprine 50 mg 1-1/2 tab daily.  6.  Enteric-coated aspirin 81 mg daily.  7.  Prilosec 20 mg daily or at home Axid 150 mg daily.  8.  Lipitor 5 mg daily at bedtime.  9.  Imuran 75 mg daily.  10. Betoptic 1  drop in the left eye daily and 1 drop in the right eye twice      daily.  11. Multivitamin daily.   DISCHARGE ACTIVITY:  No restrictions.   PAIN MANAGEMENT:  Not applicable.   DISCHARGE DIET:  Low sodium, low cholesterol diet, renal diet.   He is to follow up with [facility] on Wednesday, April 11, 2004 at  10:00 in the morning with blood work.  He will see Dr. Caryn Section on Friday, March  30th at 9:45 in the morning.  He follows up with [facility] to see  Dr. Ladona Ridgel on Monday, May 21, 2004 at 9:45 in the morning.   BRIEF HISTORY:  [name] is a 50 year old male.  He has a history of  syncope and requires CPR in the field with documented wide-complex  tachycardia and has inducible monomorphic ventricular tachycardia,  qualifying for implantation of cardioverter/defibrillator in November, 2005.  He has nonobstructive coronary artery disease  and normal left ventricular  function by echocardiogram.  He has chronic renal insufficiency,  hypothyroidism, and he is status post renal/pancreas transplant.  He has  done well since his November, 2005 cardioverter/defibrillator implantation.  He has had no chest pain, shortness of breath, palpitations.  He presented  on February 2nd with ICD discharges, with the first occurring on January  28th while working in the yard.  He recalls having dizziness prior to his  first episode but no prodromal symptoms prior to the two episodes that he  had today, which brought him in on February 2nd, the first awakening him at  10:00 in the morning.  The second was 15 seconds later.  The patient is  currently asymptomatic on examination.   HOSPITAL COURSE:  The patient was seen on presentation to the emergency room  with a history of ICD discharges.  The patient was started on Lopressor 5 mg  IV every 5 minutes x2 doses and then his Toprol, which was 50 mg, was  increased to 100 mg daily.  This was later changed to Toprol 50 mg 4 times  daily.    On hospital day #1, he was seen by Dr. Lewayne Bunting, his electrophysiologist  and started on sotalol 120 mg daily and then 1 tab daily as well.  Lopressor  and Toprol were discontinued after the first dose of sotalol.  Kidney  function and cardiac monitoring were carefully watched.  The day before the  patient was discharged on February 5th, he did have one episode of  ventricular tachycardia, which was successfully anti-tachycardia paced by  his cardioverter/defibrillator.  The patient was discharged the next day  with no further episodes, continuing on sotalol.  Followup as dictated.      GM/MEDQ  D:  07/09/2004  T:  07/09/2004  Job:  [id number]   cc:   Doylene Canning. Ladona Ridgel, M.D.   Modesto Charon  [address]  [place]  [place] [address]  Fax: [phone]   Harl Bowie, M.D.  [address]  [place]  [place] [address]  Fax: [phone]

## 2010-07-20 NOTE — Op Note (Signed)
NAMEKEANAN, MELANDER NO.:  [id number]   MEDICAL RECORD NO.:  [id number]          PATIENT TYPE:  INP   LOCATION:  2305                         FACILITY:  [facility]   PHYSICIAN:  Doylene Canning. Ladona Ridgel, M.D.  DATE OF BIRTH:  02/24/1961   DATE OF PROCEDURE:  01/11/2004  DATE OF DISCHARGE:                                 OPERATIVE REPORT   PROCEDURE PERFORMED:  Implantation of a dual chamber implantable  cardioverter-defibrillator.   INDICATIONS FOR PROCEDURE:  Sustained monomorphic ventricular tachycardia  with syncope which was hemodynamically unstable.   INTRODUCTION:  The patient is a 50 year old male with multiple medical  problems including kidney and pancreatic transplantation.  The patient  presented to the hospital with syncope and was found to have sustained  monomorphic ventricular tachycardia.  At electrophysiologic testing he was  found to have inducible hemodynamically unstable ventricular tachycardia.  He is now referred for implantable cardioverter-defibrillator implantation.  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   DESCRIPTION OF PROCEDURE:  After informed consent was obtained, the patient  was taken to the diagnostic electrophysiology laboratory in a fasting state.  After the usual preparation and draping, intravenous fentanyl and Midazolam  were given for sedation.  A total of 30 mL of lidocaine was infiltrated into  the right infraclavicular region. A 9 cm incision was carried out over this  region and electrocautery utilized to dissect down to the fascial plane.  The right subclavian vein was punctured times two and the  St.  Jude Riatta  model 1580 60 cm active fixation defibrillation lead, serial #[id number] was  advanced into the right ventricle.  At the RV apex R-waves measured over 20  mV and  a pacing threshold of 0.5 V at 0.5 msec with pacing impedance of 666  ohms with the lead actively fixed.  With the defibrillation lead in  satisfactory position, attention was turned to the atrial lead.  It was  placed in the right atrial appendage where P-waves measured 3 mV and atrial  threshold of 0.6 V at 0.5 msec again once the lead was actively fixed.  The  pacing impedance was 661 ohms.  10 V pacing was carried out in both the  atrium and the ventricle with no diaphragmatic stimulation.  With both  atrial and defibrillation leads in satisfactory position, they were secured  to the subpectoralis fascia with a figure-of-eight silk suture.  In  addition, the sewing sleeve was secured with silk suture.  At this point,  electrocautery was utilized to make a subcutaneous pocket.  Kanamycin  irrigation was utilized to irrigate the pocket.  Electrocautery was utilized  to assure hemostasis.  The St. Jude Atlas Plus DR model  V-243 dual chamber  defibrillator, serial [id number] was connected to the atrial and defibrillation  leads and placed in the subcutaneous pocket.  The generator was secured with  a silk suture.  Additional Kanamycin was utilized to irrigate the pocket and  defibrillation threshold testing  carried out.   After the patient was more deeply sedated with fentanyl and Versed.  Ventricular fibrillation was induced with a T-wave shock.  A 25 joule shock  was initially delivered with the proximal coil turned off which resulted in  termination of ventricular fibrillation and restoration of sinus rhythm.  Five minutes was allowed to elapse and a second defibrillation threshold  test carried out.  Again VF was induced with a T-wave shock.  This time the  15 joule shock was delivered which terminated ventricular fibrillation and  restored sinus rhythm.  At this point no additional defibrillation threshold  testing was carried out and the incision closed with a layer of 2-0 Vicryl,   followed by a layer of 3-0 Vicryl followed by a layer of 4-0 Vicryl.  Benzoin was painted on the skin.  Steri-Strips were applied.  Pressure  dressing placed and the patient returned to his room in satisfactory  condition.   COMPLICATIONS:  There were no immediate procedure complications.   RESULTS:  This demonstrates successful insertion of a St. Jude dual chamber  defibrillator in a patient with sustained monomorphic VT, syncope, with  inducible VT at electrophysiologic testing.  In addition, the patient had  documented complete heart block at electrophysiologic testing and for this  reason, a dual chamber device was placed.       GWT/MEDQ  D:  01/11/2004  T:  01/11/2004  Job:  [id number]   cc:   Dr. Cliffton Asters in [place]re Lain, M.D.   Jonelle Sidle, M.D. [facility]

## 2010-07-20 NOTE — H&P (Signed)
NAME:  [name], [name]:  [id number]   MEDICAL RECORD NO.:  [id number]          PATIENT TYPE:  INP   LOCATION:  6526                         FACILITY:  [facility]   PHYSICIAN:  Olga Millers, M.D. LHCDATE OF BIRTH:  03-09-1960   DATE OF ADMISSION:  DATE OF DISCHARGE:                                HISTORY & PHYSICAL   REASON FOR ADMISSION:  [name] is a very pleasant, 50 year old male,  status post implantation of a St. Jude defibrillator in November 2005 by Dr.  Lewayne Bunting, for treatment of inducible, monomorphic ventricular  tachycardia following presentation with syncope, requiring CPR resuscitation  in the field.  He also underwent coronary angiography revealing  nonobstructive coronary artery disease and 2-D echocardiogram revealing  normal left ventricular function.   The patient has chronic renal insufficiency and is also status post  pancreas/renal transplant in 2002, following longstanding diabetes mellitus.   Since his defibrillator implantation this past November, the patient reports  having done well from a cardiovascular standpoint, and was in his usual  state of health.  He denied any interim development of external chest  discomfort, dyspnea, paroxysmal nocturnal dyspnea or tachy-palpitations.   The patient presented following recent, multiple ICD discharges. The first  occurred this past Saturday while he was working in his yard at home. This  episode was preceded by some mild dizziness but no chest pain, shortness of  breath or diaphoresis.  He then had 2 episodes earlier today; the first  awakening him just before 10 a.m., the 2nd occurred some 15 seconds later.  He presented to [facility] Emergency Room and was evaluated by Dr.  Sherril Croon, who recommended transfer to [place] for further evaluation.  No dysrhythmia  noted in the emergency room, and patient is currently asymptomatic,  maintaining normal sinus rhythm on telemetry.   Of note,  most recent laboratory data notable for a potassium of 4.6,  creatinine 2.2, hemoglobin 10.6.   ALLERGIES:  PROTAMINE.   MEDICATIONS PRIOR TO ADMISSION:  1.  Prograf 3 mg b.i.d.  2.  Azathioprine 75 mg daily.  3.  Prednisone 4 mg daily.  4.  Synthroid 0.125 mg daily.  5.  Nizatidine 150 daily.  6.  Lipitor 5 daily.  7.  Toprol  XL 5 daily.  8.  Multivitamins.  9.  Aspirin 81 daily.  10. Betoptic eye drops 1 drop o.d. daily/1 drop o.s. b.i.d.   PAST MEDICAL HISTORY:  1.  Pancreas/renal transplant December 2002, preceded by longstanding      diabetes mellitus (32 years).  2.  Hypothyroidism.  3.  Chronic renal insufficiency.  4.  Gastroesophageal reflux disease.   SOCIAL HISTORY:  Patient is married, has 1 daughter.  He is unemployed  secondary to medical disability. Denies ever smoking tobacco, denies alcohol  use.   FAMILY HISTORY:  Noncontributory.   REVIEW OF SYSTEMS:  Denies recent chest pain, dyspnea, orthopnea, PND, pedal  edema, tachy-palpitations, fever, productive cough, chills or evidence of  upper or lower GI bleeding.  Remaining systems negative.   PHYSICAL EXAMINATION:  VITAL SIGNS:  Blood  pressure 146/62, pulse 75 and  regular, respirations 18, temperature 97, saturations 99% (room air).  GENERAL:  A 51 year old male in no apparent distress.  HEENT:  Normocephalic, atraumatic.  NECK:  Visible bilateral carotid pulses without bruits.  LUNGS:  Clear to auscultation in all fields.  CARDIOVASCULAR:  Regular rate and rhythm (S1/S2).  Positive S4. A 2/6  holosystolic murmur radiating to the apex.  ABDOMEN:  Soft, nontender with intact bowel sounds.  EXTREMITIES:  Preserved bilateral femoral pulses without bruits, intact  distal pulses without edema.  NEUROLOGIC:  No focal deficits.   LABORATORY DATA:  Chest x-ray: [facility]), no acute disease.  Electrocardiogram:  Normal sinus rhythm at 72 beats per minute, left axis  deviation, T waves in leads V1,  V2, nonspecific ST-T changes.   CPK 60, troponin I 0.03.  Hemoglobin 7.6 (MCV 88), WBC 7.4, platelets 158,  Met-7 (1/28).  Sodium 137, potassium 4.6, BUN 45, creatinine 2.2, glucose  101, magnesium 1.5, normal liver enzymes, INR 1.1, albumin 3.3.   IMPRESSION:  1.  Status post recurrent ICD discharges.      1.  Initial episode preceded by pre-syncope.  2.  History of inducible monomorphic ventricular tachycardia.      1.  Status post St. Jude ICD implantation November 2005.  3.  Nonobstructive coronary artery disease.      1.  Normal left ventricular function by echocardiogram.  4.  Chronic renal insufficiency.  5.  Status post kidney/pancreas transplants.      1.  Preceding by longstanding diabetes mellitus.  6.  Hypothyroidism.  7.  Normocytic anemia.   PLAN:  Interrogation of the defibrillator revealed ventricular tachycardia.  This was reviewed with Dr. Lewayne Bunting who recommended up-titration of  Toprol from 50-75 daily, and monitor overnight for any recurrent  dysrhythmia. The patient will be seen in the morning by Dr. Lewayne Bunting for  consideration of possible VT ablation.   Current recommendation is to defer the addition of an antiarrhythmic (i.e.  amiodarone).  Serial cardiac markers will be cycled to rule out underlying  ischemia.  Electrolytes will also be monitored closely and repleted as  needed.      GS/MEDQ  D:  04/05/2004  T:  04/05/2004  Job:  [id number]   cc:   Harl Bowie, M.D.  [address]  [place]  [place] [address]  Fax: [phone]   Arlan Organ  [address]  [place]  [place] [address]  Fax: [phone]

## 2010-07-20 NOTE — Discharge Summary (Signed)
NAMEOTHON, GUARDIA NO.:  [id number]   MEDICAL RECORD NO.:  [id number]          PATIENT TYPE:  INP   LOCATION:  2005                         FACILITY:  [facility]   PHYSICIAN:  Jonelle Sidle, M.D. LHCDATE OF BIRTH:  Mar 19, 1960   DATE OF ADMISSION:  01/09/2004  DATE OF DISCHARGE:  01/12/2004                           DISCHARGE SUMMARY - REFERRING   PROCEDURES:  1.  Cardiac catheterization January 10, 2004.  2.  St. Jude ICD implantation January 11, 2004.   REASON FOR ADMISSION:  Mr. Kuennen is a 50 year old male with remote history  of nonobstructive coronary artery disease, peripheral vascular disease,  diabetes mellitus and end-stage renal disease status post kidney/pancreas  transplantation in 2002 who initially presented to [facility] status  post syncope.  He received CPR in the field and found to be in wide complex  tachycardia on telemetry.  The patient was initially treated with lidocaine  followed by amiodarone with successful chemical cardioversion.  Following  stabilization, arrangements were made for transfer to [facility].  Please refer to dictated admission note for full details.   LABORATORY DATA:  CPK 49/4.3, troponin I 0.04.  Lipid profile:  Total  cholesterol 115, triglycerides 63, HDL 58, LDL 44.  TSH 4.39.  Hemoglobin  A1C 4.9.  Sodium 134, potassium 4.5, glucose 76, BUN 23, creatinine 1.8  predischarge.  Normal hemoglobin/hematocrit with wbc 14.4 and platelets 160  predischarge.   HOSPITAL COURSE:  Following transfer from [facility], the patient was  maintained on a medication regimen including low dose beta-blocker and  placed on gentle hydration for pretreatment of renal insufficiency with  plans to proceed with cardiac catheterization.  The patient was also placed  on sodium bicarbonate protocol.   A 2-D echocardiography revealed normal left ventricular function with no  wall motion  abnormalities.   Cardiac catheterization performed January 10, 2004, by Dr. Charlton Haws (see  report for full details), revealed nonobstructive coronary artery disease.  Left ventriculogram was deferred.   The patient was then referred to Duke Salvia, M.D., for  electrophysiology study.  He was found to have inducible, hemodynamically  compromising ventricular tachycardia.  Dr. Graciela Husbands also recommended checking  an ACE level, which was pending at the time of this dictation.  An ACE level  was ordered to rule out sarcoid but pending at the time of dictation.  Plans  were to proceed with ICD implantation the following morning.  Renal function  remained stable.   On January 11, 2004, the patient underwent successful placement of a St.  Jude ICD device by Doylene Canning. Ladona Ridgel, M.D., with no noted complications.  He  was kept for overnight observation and cleared for discharge the following  morning in hemodynamically stable condition.   MEDICATION ADJUSTMENTS THIS ADMISSION::  Addition of metoprolol.   DISCHARGE MEDICATIONS:  1.  Toprol XL 50 mg daily.  2.  Enteric coated aspirin 81 mg daily.  3.  Prograf 3 mg b.i.d.  4.  CellCept 250 mg b.i.d.  5.  Prednisone 5 mg daily.  6.  Synthroid  0.125 mg daily.  7.  Prilosec 20 mg daily.  8.  Os-Cal 500 mg b.i.d.  9.  Betoptic eye drops one drop right eye q.a.m./one drop both eyes q.p.m.   DISCHARGE INSTRUCTIONS:  The patient is to follow instructions as outlined  on pacemaker sheet.  He is to call the office if he notes any bleeding or  swelling from his incision sites.   1.  The patient will follow up at the [facility] February 01, 2004, at 9 a.m.  2.  The patient will follow up with Dr. Lewayne Bunting in three months -      arrangements to be made through our office.  3.  The patient instructed to arrange follow-up with his primary      cardiologist, Harl Bowie, M.D., [place] in       approximately two weeks.   DISCHARGE DIAGNOSES:  1.  Monomorphic ventricular tachycardia.      1.  Hemodynamically unstable.      2.  Associated syncope.      3.  Status post St. Jude implantable cardioverter defibrillator          implantation January 11, 2004.  2.  Nonobstructive coronary artery disease.  3.  Peripheral vascular disease.  4.  Treated hypothyroidism.  5.  Type 1 diabetes mellitus.      1.  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  6.  History of gastroesophageal reflux disease.       GS/MEDQ  D:  01/12/2004  T:  01/12/2004  Job:  [id number]   cc:   Harl Bowie, M.D.  [address]  [place]  [place] [address]  Fax: [phone]   Arlan Organ  [address]  [place]  [place] [address]  Fax: [phone]   Wilber Bihari. Caryn Section, M.D.  [address]  [place]  [place] [address]  Fax: [phone]   Harold Hedge, M.D.  [facility]

## 2010-07-20 NOTE — Op Note (Signed)
[name], [name] NO.:  [id number]   MEDICAL RECORD NO.:  [id number]                   PATIENT TYPE:  AMB   LOCATION:  [facility]                                 [facility]ACILITY:  [facility]   PHYSICIAN:  [name], M.D.          DATE OF BIRTH:  [birth year]   DATE OF PROCEDURE:  09/02/2003  DATE OF DISCHARGE:                                 OPERATIVE REPORT   PREOPERATIVE DIAGNOSES:  1. Neurotrophic ulcer, plantar surface, left third toe.  2. Diabetes mellitus with severe peripheral neuropathy.  3. History of kidney and pancreas transplant for chronic diabetes and     chronic renal failure.   OPERATION:  Left third toe amputation through the MP joint, local with  sedation.   SURGEON:  Dr. Consuello Price   HISTORY:  [name], a 50 year old diabetic, longstanding, who is a  chronic renal failure patient who had a kidney/pancreas transplant  approximately 3 years ago.  He has had severe problems with peripheral  neuropathy and has already lost the left great and second toe on separate  occasions and is continuing to work until recently.  He has developed a  large ulcer on the plantar surface of the third toe with a kind of  smoldering infection in the third toe at this time.  He has good peripheral  pulses in dorsal pedal and posterior tibial, and I saw him yesterday and  recommended that we go ahead and proceed with a third toe amputation.  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   The patient was taken to the operative suite, given 1 g of Kefzol and  positioned on the OR table.  An IV had been started, and he was given a  small amount of sedation.  After prepping with Betadine solution, I placed a  little digital block with 1% plain Xylocaine medial and laterally about 4 mL  of solution used.  Next, we made the superior flap, sort of  pressed the toe  down, divided the extensor tendon, and then used a bone cutter to clip the  toe right at the base of the PIP joint.  Next, the flexor tendon was put on  stretch, divided, and the bottom flap had been previously made.  Next, I  used a rongeur to trim off the proximal phalanx, and there were no bone  spurs.  We then sort of shortened the extensor and flexor tendon.  I did  place a figure-of-eight suture on the lateral digital artery area.  The  previous second toe amputation probably had occluded the medial digital  artery, but I placed a figure-of-eight suture of 4-0 Vicryl in case there  would be a delayed bleeding.  Next, the subcutaneous tissue was closed with  a single 4-0 Vicryl, and  then 4 sutures of 3-0 nylon were placed to  approximate the skin edges.  Antibiotic ointment was applied and then a  light dressing of 4 x 4 and a 3-inch Kerlix was placed.  The patient will be  released after a short stay, continue  on his antibiotics, and I will see him for a postoperative wound check in  approximately 1 week.  He is instructed not to start soaking until after I  see him in the office and if there is any redness, fever, or problems, to  give [place] a call.  He is continued on his chronic renosuppressive medications  for his kidney transplant.                                               [name], M.D.    WJW/MEDQ  D:  09/02/2003  T:  09/02/2003  Job:  [id number]   cc:   [name]. [name], M.D.  [address]  [place]  [place] [address]  Fax: [phone]

## 2010-07-20 NOTE — H&P (Signed)
NAMEDEVEREAUX, GRAYSON NO.:  [id number]   MEDICAL RECORD NO.:  [id number]          PATIENT TYPE:  INP   LOCATION:  5532                         FACILITY:  [facility]   PHYSICIAN:  Garnetta Buddy, M.D.   DATE OF BIRTH:  1960-11-04   DATE OF ADMISSION:  01/28/2006  DATE OF DISCHARGE:                              HISTORY & PHYSICAL   REASON:  Fevers, nausea, vomiting and diarrhea.   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  The patient was seen today in the  [facility] office complaining of an originally  nonproductive cough that began approximately 3 weeks ago.  Yesterday,  the patient began coughing up green phlegm.  He also began having  chills, vomiting, diarrhea and fever with temperatures ranging from 102  to 104.  This morning upon awakening, the symptoms have worsened with  increased diarrhea producing incontinent episodes and lumbar back pain.  The patient is being admitted for further evaluation.  He denies any  recent travel or any recent antibiotic therapy at this time   PAST MEDICAL HISTORY:  As stated above as well as:  1. Hypertension.  2. Gallstones, asymptomatic.  3. End-stage renal disease secondary to diabetes mellitus type 1.  4. Hypothyroidism.  5. Peptic ulcer disease.  6. 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  7. The patient has ventricular tachycardia with a St. Jude implanted      defibrillator placed in November of 2005.  The patient also had a      perforated ventricle with tamponade and sternotomy with bleeding      secondary to radiofrequency ablation of ventricular tachycardia.      The patient had a cardiac cath at that time that  revealed coronary      artery disease.   ALLERGIES:  PROTAMINE.   MEDICATIONS:  1. Calcitriol 0.25 mg one p.o. daily Monday through Friday.  2. Plendil 5 mg one p.o. daily.  3. Prograf 1 mg 2 tablets p.o. b.i.d.  4. Imuran 50 mg one and a half tabs p.o. q.h.s.  5. Prednisone 5 mg one p.o. daily.  6. Synthroid 0.125 mg daily.  7. Nizatidine 150 mg daily.  8. Multivitamin one p.o. daily.  9. Enteric-coated aspirin 81 mg one p.o. every other day.  10.Betoptic 1 drop to the right eye q.a.m. and both eyes q.h.s.  11.Lutein 26 mg daily.  12.Salmon oil 500 mg one p.o. t.i.d.  13.Imodium p.r.n.  14.Os-Cal plus D one p.o. q.h.s.  15.Crestor 5 mg one p.o. every other day.  16.Hydrochlorothiazide 12.5 mg one p.o. q.i.d.   SOCIAL HISTORY:  The patient lives in a house with his wife and  stepdaughter.  He is currently on medical leave; however, he previously  worked as an [employer] officer.  He is married and has no children of  his  own.  He denies any tobacco, alcohol or illicit drug use.  He completed  the twelfth grade in trade school.   FAMILY HISTORY:  The patient's mother is alive.  She is approximately 22  years old.  She underwent a five vessel CABG the day after Thanksgiving,  and also has a history of hypertension.  The patient's father is  approximately 5 years of age.  He has a history of hypertension and  prostate cancer.  The patient has 3 siblings, 1 sister and 2 brothers,  with nonspecific back problems and kidney stones.   REVIEW OF SYSTEMS:  GENERAL:  The patient does have a history of fever,  chills, sweating, decreased appetite and increased fatigue.  He denies  any weight change or adenopathy.  HEENT:  The patient does have a  headache and occasional intermittent rhinorrhea.  Denies any bleeding,  vertigo, photophobia, hearing loss or dental problems.  The patient does  wear corrective lenses at all times.  DERMATOLOGICAL:  Denies any  rashes, lesions or ulcers.   CARDIOPULMONARY:  Denies any chest pain,  shortness of breath, dyspnea on exertion, orthopnea, PND, edema,  palpitations, claudications.  The patient does have a cough as stated  above in the HPI, and also states he has had some wheezing, as well.  ENDOCRINE:  Denies polyuria, dipsia, heat or cold intolerance.  GU:  Denies frequency, urgency, dysuria, hematuria, or nocturia.  NEUROPSYCHIATRIC:  The patient has had some increased weakness that has  been acute since the onset of symptoms.  Denies any numbness, depression  or anxiety.  MUSCULOSKELETAL:  The patient does have some lumbar back  pain which is new since this morning.  He otherwise denies any myalgias,  arthralgias, joint swelling or deformity.  GI:  The patient does have  nausea, vomiting, diarrhea as stated above.  He denies any bright red  blood per rectum, melena, hematemesis, dysphagia, odynophagia.  The  patient does also have a history of GERD.   PHYSICAL EXAMINATION:  VITAL SIGNS:  Temperature 98.8, pulse 66,  respirations 28, blood pressure 150/70.  GENERAL:  This is an alert, ill-appearing male who appears in obvious  distress.  He is moaning in pain upon examination.  HEENT:  Head is normocephalic and atraumatic.  Extraocular movements are  intact.  Nares are patent without discharge or edema.  Mucous membranes  are dry.  Dentition is in good condition.  Oropharynx is without  erythema or exudates.  NECK:  Supple without lymphadenopathy, thyromegaly, bruit or JVD.  CARDIOVASCULAR:  Regular rate and rhythm with an S1 and S2 noted.  A  grade 2/6 systolic murmur is present.  No rubs, gallops or clicks noted.  Pulses are 1+ and regular, without bruit.  RESPIRATORY:  Rhonchi are noted in bilateral lower lobes.  The patient  is otherwise clear throughout.  DERMATOLOGICAL:  The patient is diaphoretic upon the time of physical  examination.  No rashes or lesions are noted. GI:  Abdomen is soft, nontender, nondistended.   Positive bowel sounds  are noted on all four quadrants.  No rebound, guarding, masses or  organomegaly present.  The patient has a transplanted kidney currently  in his left lower quadrant.  No tenderness is noted over the  transplanted kidney site.  EXTREMITIES:  No cyanosis, clubbing, edema,  rashes, lesions or petechiae noted.  Hemodialysis access - the patient  has a right forearm AV graft with positive thrill and bruit.  MUSCULOSKELETAL:  The  patient does have lumbar spinal tenderness with  palpation.  NEUROLOGICAL:  The patient is alert and oriented x3.  I am  unable to perform a complete neurological examination due to the patient  being uncooperative secondary to pain.   LABORATORY DATA:  Sodium 142, potassium 4.8, chloride is 107,  bicarbonate 21, BUN 76, creatinine is 4.1.  Please note, previous serum  creatinine levels at the [facility] are 2.5 and 2.9.  Glucose 111, calcium 9.4, ALP 58, total bilirubin 1.0, albumin 3.22,  total protein of 7.0, AST 31, ALT is 22.   ASSESSMENT AND PLAN:  1. Fever with nausea, vomiting and diarrhea.  The patient will be      admitted for further evaluation.  He will be pancultured with      empiric antibiotic therapy initiated.  The patient will be kept      n.p.o. at this time with most of his medications changed to IV.  We      will begin gentle IV hydration.  We will obtain a KUB and chest x-      ray.  He will be given an H2 blocker daily, as well as p.r.n.      antiemetic therapy.  2. Lumbar back pain.  We will obtain films upon arrival to the      hospital.  We will offer pain medications on a p.r.n. basis.  3. History of kidney and pancreas transplant in 2002 with allograft.      Nephropathy via biopsy in September of 2007 with history of      cadaveric renal transplant in 1998.  Again, as stated above, his      anti-rejection medications will be changed to IV form.  His serum      creatinine is noted to be markedly  elevated with his baseline serum      creatinine to be estimated at 2.5 to 2.9.  We will again begin      gentle IV hydration and follow his lab work closely.  We will also      obtain a Prograf level upon arrival to the hospital, and we will      begin monitoring the patient's blood glucose levels closely and      offer sliding scale insulin therapy p.r.n.  4. Hypertension.  Again, the patient's oral medications will be held      at this time.  We will offer p.r.n. medication with parameters for      elevated blood pressure.  5. Hypothyroidism.  The patient's Synthroid again will be changed to      IV form.  6. Peptic ulcer disease.  The patient's Zantac will be changed to IV      form at this time, and he will be placed on GI rest.  7. History of ventricular tachycardia with St. Jude defibrillator.  No      intervention at this time.  Continue to follow.   DISPOSITION:  The patient will be discharged home once stable.     Tracey P. Sherrod, NP      Garnetta Buddy, M.D.  Electronically Signed    TPS/MEDQ  D:  01/28/2006  T:  01/28/2006  Job:  [id number]

## 2010-07-20 NOTE — Op Note (Signed)
NAMESHADOE, BETHEL NO.:  [id number]   MEDICAL RECORD NO.:  [id number]          PATIENT TYPE:  INP   LOCATION:  2302                         FACILITY:  [facility]   PHYSICIAN:  Doylene Canning. Ladona Ridgel, M.D.  DATE OF BIRTH:  08-12-1960   DATE OF PROCEDURE:  04/17/2004  DATE OF DISCHARGE:                                 OPERATIVE REPORT   PROCEDURE PERFORMED:  Emergent pericardiocentesis.   SURGEON:  Doylene Canning. Ladona Ridgel, M.D.   INDICATIONS:  Pericardial tamponade.   I. INTRODUCTION:  The patient is a 50 year old male with a history of  kidney/pancreas transplant, a history of sustained monomorphic  hemodynamically unstable VT, status post ICD insertion, who was admitted to  hospital with recurrent VT despite medical therapy. The patient underwent VT  ablation earlier today. During the procedure, he had inducible VT which was  successfully ablated. He was hemodynamically stable during the procedure,  although ACT was elevated at over 300 seconds to prevent thromboembolic  complications. The patient went to the holding area and became poorly  responsive and was hypotensive with blood pressures in the 60-80 range. IV  fluids were given wide-open and has Romazicon was given to reverse his  sedation. The patient subsequently had a bedside emergent 2-D echo which  demonstrated a very massive pericardial effusion.   II. DESCRIPTION OF PROCEDURE:  He was taken emergently to the  catheterization lab, where a combination of fluoroscopic and  echocardiographic guidance was utilized to enter his pericardial space. With  approximately 30 mL of fluid removed, the blood pressure increased into the  90s. A total of 700 mL of sanguinous fluid were removed utilizing a  pericardial drain. The patient's blood pressure immediately increased with  removal of fluid from 60-70 systolic to 170 systolic. He was given fentanyl  for pain. The pericardial drain was secured to the skin and hooked up to  wall suction. The patient was taken to the ICU in stable condition.      GWT/MEDQ  D:  04/17/2004  T:  04/18/2004  Job:  [id number]

## 2010-07-20 NOTE — Op Note (Signed)
NAMEBROADUS, COSTILLA NO.:  [id number]   MEDICAL RECORD NO.:  [id number]          PATIENT TYPE:  INP   LOCATION:  2929                         FACILITY:  [facility]   PHYSICIAN:  Doylene Canning. Ladona Ridgel, M.D.  DATE OF BIRTH:  24-Mar-1960   DATE OF PROCEDURE:  04/17/2004  DATE OF DISCHARGE:                                 OPERATIVE REPORT   PROCEDURE PERFORMED:  Electrophysiologic study and RF catheter ablation of  ventricular tachycardia, utilizing electroanatomic three-dimensional  mapping.   INTRODUCTION:  The patient is a 50 year old man who has a history of VT. He  is status post renal transplant. His LV function has been preserved in the  past; however, his VT has been quite rapid, resulting in hemodynamic  compromise and syncope. This necessitated his initial ICD implantation. He  unfortunately has had recurrent episodes of VT despite beta blockers and  sotalol. He is now referred for electrophysiology study and catheter  ablation.   PROCEDURE:  After informed consent was obtained, the patient is taken to the  diagnostic EP lab in a fasting state. After usual preparation and draping,  intravenous fentanyl and midazolam was given for sedation. A 6-French  hexapolar catheter was inserted percutaneously into the right jugular vein  and advanced to the coronary sinus. A 5-French quadripolar catheter was  inserted percutaneously in the left femoral vein, advanced to the RV apex. 5-  French quadripolar catheter was inserted percutaneously in the left femoral  vein, advanced the His bundle region. A 9-French EFI electroanatomic three-  dimensional mapping catheter was inserted percutaneously into the left  femoral artery and advanced retrograde across the aortic valve into the left  ventricle, and 5000 units of heparin was then infused.  A 7-French  quadripolar catheter was inserted in the right femoral artery and advanced  to the left ventricle. Initially electroanatomic  mapping was carried out  with the geometry file created. Next, rapid ventricular pacing was carried  out from the RV apex at a pacing cycle length of 500 milliseconds,  demonstrating VA dissociation. Next, programmed ventricular stimulation was  carried out from the RV apex, and the patient's cycle length of 500  millisecond, and S1/S2/S3 stimuli were delivered with the S1/S1, and S2-S3  interval stepwise was decreased down to ventricular refractions. At an S1/S2-  S3 coupling interval of 500/240/270, there was inducible ventricular  tachycardia. This was the patient's clinical VT, which was right bundle/left  superior axis VT at a cycle length of approximately 295 to 300 milliseconds.  This tachycardia was again associated with hemodynamic compromise. Rapid  ventricular pacing at 240 milliseconds was able to terminate the  tachycardia. At this point, the circuit was mapped utilizing the three-  dimensional electroanatomic array.  It demonstrated the released activation  during diastole to arise on the diaphragmatic wall of the left ventricle  approximately one half the distance from the base to the apex on the lateral  portion of the diaphragmatic surface.  A total of 17 RF energy applications  were delivered at early sites of the ventricular activation in this region.  They were withdrawn towards the base of the mitral annulus. Following  catheter ablation, attempts to re-induce tachycardia were carried out with  S1 and S2, and S1/S2/S3 stimuli. This resulted in no inducible VT. At this  point, the catheters were removed. Hemostasis was assured, and the patient  was returned to his room in satisfactory condition. It should be noted that  at the site of RF energy application, pace mapping was carried out at a  cycle length of approximately 280 milliseconds, and this demonstrated 12 out  of 12 pace map match.   COMPLICATIONS:  There were no immediate procedural complications.   RESULTS:   Baseline ECG:  The baseline ECG demonstrates normal sinus rhythm  with normal axis and intervals.   Baseline intervals:  The sinus node cycle length was 934 milliseconds. The  PR interval 188 milliseconds, QRS duration 102 milliseconds. The AH interval  100 milliseconds the HV interval 36 milliseconds. Following ablation, there  is no significant change in these intervals.   Rapid ventricular pacing:  Rapid ventricular pacing was carried out from the  RV apex at base drive cycle length of 323 milliseconds. This demonstrated VA  dissociation.   Programmed ventricular stimulation:  Programmed ventricular stimulation was  carried out from the RV apex at a base drive cycle length of 557  milliseconds. S1/S2, and S1/S2/S3 stimuli were delivered with the S1/S2, and  S2-S3 interval stepwise was decreased down to ventricular fractions. During  programmed ventricular stimulation prior to ablation, there was inducible  ventricular tachycardia. Following ablation, programmed ventricular  stimulation was reproducibly carried out, and this demonstrated no inducible  VT. There was nonsustained VT.   ARRHYTHMIAS OBSERVED:  1.  Ventricular tachycardia initiation:  Initiation with programmed      ventricular stimulation duration was sustained.  Cycle length was 296      milliseconds. QRS morphology was right bundle, left superior axis.      Method of termination was with rapid ventricular pacing. Of note, the      patient's VT was hemodynamically unstable, making mapping more      difficult.   RF mapping:  Mapping of the patient's VT demonstrated the earliest  activation in diastole to be at the diaphragmatic wall of the left ventricle  more towards the lateral wall than the septum and approximately one half-way  from base to apex.   RF energy application:  A total of 17 RF energy applications were delivered  resulting in rendering the tachycardia noninducible.  Rapid atrial pacing:  Rapid atrial  pacing demonstrated no inducible  tachycardia.   Probing stimulation:  Probing stimulation was carried out, demonstrating no  inducible tachycardia.   CONCLUSIONS:  Study demonstrates successful electrophysiologic study and RF  catheter ablation of hemodynamically unstable ventricular tachycardia with a  total of 17 RF energy applications delivered to the region along the  diaphragmatic wall of the left ventricle, resulting in a rendering the  tachycardia noninducible.      GWT/MEDQ  D:  04/17/2004  T:  04/17/2004  Job:  [id number]   cc:   Olga Millers, M.D. [facility]  [address]  [place]  [place] [address]  Fax: [phone]   Dr. Virgie Dad

## 2010-07-20 NOTE — Discharge Summary (Signed)
[name], [name]:  [id number]   MEDICAL RECORD NO.:  [id number]          PATIENT TYPE:  INP   LOCATION:  2034                         FACILITY:  [facility]   PHYSICIAN:  Olga Millers, M.D. [facility] OF BIRTH:  11-Apr-1960   DATE OF ADMISSION:  04/05/2004  DATE OF DISCHARGE:  04/09/2004                                 DISCHARGE SUMMARY   This is a priority discharge.   DISCHARGE DIAGNOSES:  1.  Admission with presyncope and concurrent ICD therapy.  2.  Ventricular tachycardia with several episodes of requiring      antitachycardia pacing while on beta blockade here at [facility].  3.  Beta blockade increased from his Toprol 50 mg prior to admission dose to      200 mg daily.  4.  The patient ruled out for myocardial infarction by serial troponin I      studies.  They were 0.05, then 0.04, then 0.04.  5.  No further ventricular tachycardia after beginning Sotalol therapy at      120 mg daily.   SECONDARY DIAGNOSES:  1.  History of end-stage renal disease status post kidney, pancreas      transplant in 2002.  2.  Renal insufficiency with creatinine on admission April 05, 2004 was      1.8.  Creatinine on April 08, 2004, the day before discharge was 2.1.  3.  Hypothyroidism with TSH within normal limits at 4.37.  4.  Gastroesophageal reflux disease.  5.  Diabetes.  6.  History of syncope in November of 2005 in the setting of wide complex      tachycardia, status post left heart catheterization January 10, 2004      with no significant coronary artery disease with electrophysiology study      January 11, 2004 demonstrating inducible ventricular tachycardia and      status post [employer] ICD implantation.   DISCHARGE DISPOSITION:  [name] is going home April 09, 2004 after  starting Sotalol 120 mg daily.  His last ventricular tachycardia episode was  at 10 o'clock in the morning on April 08, 2004. The patient did feel  dizzy.  He felt racing  in his heart.  He did receive appropriate  antitachycardia pacing with cessation of the tachycardia.  At the time of  discharge, he is in a respiratory rate, sinus rhythm.  He has been afebrile.  Vital signs are stable.   PLAN:  To follow up at [facility] on Wednesday, April 11, 2004 at 10  o'clock in the morning for both a CBC and a BMET, to follow up on the  creatinine and possible. Also to see Dr. Caryn Section Friday, May 04, 2004 at 9:45  in the morning and he will see Dr. Ladona Ridgel in follow up at [address], Monday, May 21, 2004 at 9:45 in the morning.   [name] goes home on the following medication:  1.  Sotalol 120 mg daily.  2.  Prograf 1 mg tablet, three tablets, twice daily.  3.  Prednisone 5 mg daily.  4.  Synthroid 125 mcg daily.  5.  Azathioprine 50 mg tablets, 1-1/2 tablets daily.  6.  Aspirin 81 mg daily.  7.  Axid 150 mg daily.  8.  Lipitor 5 mg daily at bedtime.  9.  Imuran 75 mg daily.  10. Betoptic one drop in left eye daily and one drop right eye twice daily.   PAIN MANAGEMENT:  Not applicable.   ACTIVITY:  No restrictions.   DIET:  Low sodium, low cholesterol, diabetic/renal diet.   Once again, follow up is with [facility], Dr. Scherrie Gerlach office Wednesday,  April 11, 2004 at 10 o'clock in the morning for blood work.  He will see  Dr. Caryn Section Monday, May 04, 2004 at 9:45 in the morning.  He sees Dr. Ladona Ridgel  Monday, May 21, 2004 at 9:45 in the morning.   BRIEF HISTORY:  [name] is a 50 year old male.  He has a history in  November of 2005 of syncope.  He required CPR out of hospital with  documented wide complex tachycardia.  He underwent a left heart  catheterization at that hospitalization which showed no significant coronary  artery disease and then a follow-up electrophysiology study which showed  inducible monomorphic ventricular tachycardia.  An ICD was implanted  January 11, 2004.  The patient has done well since ICD  implantation, has  had no chest pain, dyspnea or palpitation.  However, he presents April 05, 2004 after ICD discharges x2 on that day alone however, he did have a  prior discharge several days prior to this while working in the yard.  With  the first episode, he reported dizziness but had no prodromal symptoms prior  to two episodes today.  The first awoke him at about 10 o'clock in the  morning.  The second occurred 15 seconds later. The patient arrives at [facility].  He is currently asymptomatic.  His electrocardiogram  shows sinus rhythm.  The patient will be admitted.  The ICD will be  interrogated.  Electrolytes will be checked.  He will be ruled out for a  myocardial infarction.  Further medical therapy per Dr. Ladona Ridgel.  He will  probably receive amiodarone if he has documented ventricular tachycardia.  TSH will also be checked.   HOSPITAL COURSE:  The patient presented April 05, 2004.  Interrogation of  the ICD shows ventricular tachycardia with antitachycardia pacing for  several episodes and DC cardioversion for at least three episodes. The  patient was started on increased doses of beta blockade and was followed up  by Dr. Ladona Ridgel.  A question of possible ventricular tachycardia ablation or  initiation of antiarrhythmics.  The patient has had several ventricular  tachycardia episodes during the next 48 hours.  All of these terminated  appropriately with antitachycardia pacing.  The patient does feel dizziness  and palpitation with these tachycardias.  The patient was then started on  April 08, 2004 on Sotalol 120 mg daily and the Toprol was discontinued  and the beta blocker, metoprolol which was 200 mg a day was discontinued.  He has done well on Sotalol and discharge is April 09, 2004 with follow  up for creatinine as dictated.  He follows up with the medications and follow up with Dr. Ladona Ridgel and Dr. Caryn Section as dictated.   His BMET on April 08, 2004 was:  Sodium 140, potassium 4.9, chloride 115,  carbonate 22, glucose is 80, BUN is 45, creatinine 2.1.  TSH this admission  was 4.373.  Ferritin level 427.  Total iron is 53.  His last complete blood  count was April 06, 2004.  It was white cells 8.2, hemoglobin 10.9,  hematocrit 33, platelets 175,000.  Troponin studies once again were 0.05,  0.04 and then finally, 0.04 to rule out myocardial infarction.  His  admission magnesium was 1.9 and his admission CMET was sodium 138, potassium  4.8, chloride 117, carbonate 19, glucose 88, BUN is 42, creatinine is 1.8,  alkaline phosphatase 85, SGOT 21, SGPT is 18.   Of note, Dr. Ladona Ridgel had discussed with Dr. Elvis Coil dosage of Sotalol for  this patient and Dr. Hyman Hopes recommended follow up BMET Wednesday, April 11, 2004.      GM/MEDQ  D:  04/09/2004  T:  04/09/2004  Job:  [id number]   cc:   Wilber Bihari. Caryn Section, M.D.  [address]  [place]  [place] [address]  Fax: [phone]   Harl Bowie, M.D.  [address]  [place]  [place] [address]  Fax: [phone]   Doylene Canning. Ladona Ridgel, M.D.   [facility]  [address]  [place]  [place] [address]  Fax: [phone]

## 2010-07-20 NOTE — Discharge Summary (Signed)
NAMEBLAKE, VETRANO NO.:  [id number]   MEDICAL RECORD NO.:  [id number]          PATIENT TYPE:  INP   LOCATION:  6731                         FACILITY:  [facility]   PHYSICIAN:  Maree Krabbe, M.D.DATE OF BIRTH:  1961-02-14   DATE OF ADMISSION:  03/14/2007  DATE OF DISCHARGE:  03/16/2007                               DISCHARGE SUMMARY   DISCHARGE DIAGNOSES:  1. Abdominal pain with unclear etiology, now resolved.  2. End-stage renal disease.  3. 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   PROCEDURES:  March 15, 2007, abdominal ultrasound.   IMPRESSION:  1. Cholelithiasis.  2. Transplant kidney with no evidence of hydronephrosis.  3. Echogenic foci in the liver with shadowing most likely representing      liver calcifications.  4. Bladder mildly thickened versus under-distention of the bladder.   HISTORY OF PRESENT ILLNESS:  Mr. Levi is a 50 year old white male with  a history of kidney pancreas transplant in 2002 with a history of  hemodialysis since August 2008.  The patient presents with a less than  24-hour history of abdominal pain which is periumbilical and epigastric  with nausea and dry heaves.  He has had no diarrhea, fever, chills or  sweats.  No shortness of breath, no chest pain.  Of note, the patient  has not voided over the last 24 hours which he states is very unusual  for him.   ADMISSION LABS AT [facility]:  Potassium 4.0, BUN 33, serum  creatinine 3.65, calcium 9.6, albumin 3.5, amylase 110, AST and ALT are  normal, alk phos of 139, lipase 85.   Admission chest x-ray:  Right-sided transvenous pacemaker leaning to the  right atrium and right ventricle and large heart.  AVM sternotomy, no  pulmonary edema, perihilar bronchitic changes, no focal consolidation.   HOSPITAL COURSE:  1. Abdominal pain, uncertain etiology, now resolved.  The patient      admitted and all labs reviewed increasing  amylase and lipase.  He      did undergo abdominal ultrasound with results above.  The patient      did undergo an in-and-out catheterization which yielded 489 mL of      urine, and symptoms were completely gone at that time.  There was      no recurrence of his abdominal pain.  The patient did have some      noted diarrhea this admission, and empiric Flagyl was started with      questionable C.  difficile.  There was no further intervention.  2. End-stage renal disease.  The patient dialyzed Monday, Wednesday,      Friday on schedule.  No interventions.  3. Status post renal pancreas transplant.  The patient continued on      his immunosuppressions without change.   DISCHARGE MEDICATIONS:  1. Prograf 2 mg b.i.d.  2. Prednisone 5 mg daily.  3. Crestor 5 mg every other day.  4. Fish oil 2000 mg daily.  5. Lumigan as directed.  6. Synthroid 175 mcg daily.  7. Imuran 75 mg daily.  8. Plendil 10 mg q.h.s.  9. Prinivil 10 mg q.h.s.  10.Dilantin 100 mg t.i.d.  11.Flagyl 500 mg t.i.d.  12.Pepcid over-the-counter 1 daily.   DISCHARGE INSTRUCTIONS:  The patient will need to continue a renal diet.  Activity will be as tolerated.  He is to take his new antibiotic which  is Flagyl 3 times a day, and he has been provided a prescription.  No  changes to hemodialysis orders except an increase Epogen to 25,000  units.  The dialysis center is instructed to follow up on stool cultures  that were performed on January 11.  He should obtain an in-center  hemoglobin next treatment and call the [facility] if less than 8.      Azucena Fallen, [place]      Maree Krabbe, M.D.  Electronically Signed    MY/MEDQ  D:  04/11/2007  T:  04/13/2007  Job:  [id number]   cc:   [facility]

## 2010-07-20 NOTE — Op Note (Signed)
[name], [name]:  [id number]   MEDICAL RECORD NO.:  [id number]          PATIENT TYPE:  INP   LOCATION:  2302                         FACILITY:  [facility]   PHYSICIAN:  Evelene Croon, M.D.     DATE OF BIRTH:  May 11, 1960   DATE OF PROCEDURE:  04/18/2004  DATE OF DISCHARGE:                                 OPERATIVE REPORT   PREOPERATIVE DIAGNOSES:  Left ventricular perforation status post left  ventricular ablation.   POSTOPERATIVE DIAGNOSES:  Left ventricular perforation status post left  ventricular ablation.   PROCEDURE:  Emergency median sternotomy, drainage of hemopericardium and  control of bleeding from left ventricular perforation.   SURGEON:  Evelene Croon, M.D.   ANESTHESIA:  General endotracheal.   CLINICAL COURSE:  This patient is a 50 year old gentleman who underwent a  radiofrequency ablation of the left ventricle for ventricular tachycardia by  Dr. Lewayne Bunting. Post procedure, the patient developed hypotension and a 2-  D echocardiogram showed a large pericardial effusion with tamponade. He  underwent emergent pericardiocentesis withdrawing 700 mL of bloody fluid. He  had immediate hemodynamic improvement. The pericardial drain was left in  place.  The patient had been given heparin for the procedure but was  allergic to protamine and therefore his ACT remained elevated post  procedure. He continued to bleed and over the next several hours about 800  mL of bloody fluid was removed through the catheter. The patient continued  to bleed from the catheter and developed a lower blood pressure then he  usually has.  His ACT had decreased to about 160.  Therefore it was felt  that the best course of action would be to proceed to the operating room for  emergent exploration to drain his hemopericardium and ligate any bleeding  sites to prevent any further complication. I discussed the operative  procedure of median sternotomy with the patient and  his wife including  alternatives, benefits, and risks including bleeding, infection, injury to  the heart, possible need for cardiopulmonary bypass and death. They  understood and agreed to proceed.   DESCRIPTION OF PROCEDURE:  The patient was taken to the operating room and  placed on the table in supine position. After induction of general  endotracheal anesthesia, a Foley catheter was placed in the bladder using  sterile technique. Then the chest and abdomen were prepped with Betadine  soak and solution, draped in the usual sterile manner. The pericardial  drainage catheter was removed.  The sternum was opened through a median  sternotomy incision and the pericardium opened in the midline. Damage to the  heart showed good ventricular contractility. There was a moderate sized  hemopericardium without tamponade.  The heart was then retracted towards the  right side to expose the posterolateral wall. There was a large hematoma  over the lateral wall with a small amount of oozing from this area.  It was  difficult to expose this area due to the patient's large heart. I did not  want to put him on a cardiopulmonary bypass unless it was absolutely  necessary since  he was allergic to protamine and would continue to bleed  after bypass while the heparin reversed itself.  Therefore the bleeding from  the lateral wall was controlled with direct pressure for several minutes.  This resulted in complete hemostasis. Two chest tubes were placed including  the posterior pericardium wall and the anterior mediastinum. We continued to  observe the pericardium for signs of bleeding from the lateral wall and  there was none. I felt that no further intervention was warranted at this  time.  Then the sternum was closed with #6 stainless steel wires. The fascia  was closed with continuous #1 Vicryl suture. The subcutaneous tissue was  closed with continuous 2-0 Vicryl, skin with 3-0 Vicryl subcuticular   closure.  The sponge, needle and instrument counts were correct.  A dry sterile  dressing applied over the incision at around the chest tube __________  suction.  The patient remained hemodynamically stable and was transported to  the SICU in guarded but stable condition.      BB/MEDQ  D:  04/18/2004  T:  04/18/2004  Job:  [id number]

## 2010-07-20 NOTE — Assessment & Plan Note (Signed)
[facility]                           ELECTROPHYSIOLOGY OFFICE NOTE   ZEVEN, KOCAK                       MRN:          [id number]  DATE:09/11/2005                            DOB:          02/07/1961    REFERRING PHYSICIAN:  Marinus Maw   ELECTROPHYSIOLOGY CLINIC NOTE   [name] returns today for followup.  He is a very pleasant middle-aged man  with a history of end-stage renal disease on hemodialysis subsequently  status post kidney transplant with a history of mild LV dysfunction and  sustained monomorphic VT for which he underwent initial ICD insertion  secondary to the VT and syncope.  Unfortunately, we could not control his VT  with sotalol and he underwent VT ablation, which was successful in curing  his VT, however, complicated by large pericardial effusion which was drained  but for which bleeding could not be stopped and he ultimately underwent  median sternotomy to control the bleeding.  The patient returns today for  followup.  He has had no intercurrent ICD therapies and no palpitations.  He  denies chest pain or shortness of breath.   EXAM:  GENERAL:  He is a pleasant well-appearing middle-aged man in no acute  distress.  VITAL SIGNS:  Blood pressure 163/76.  Pulse 71 and regular.  Respirations  18.  Weight 149 pounds.  NECK:  Revealed no jugular venous distention.  LUNGS:  Clear bilaterally to auscultation.  CARDIOVASCULAR EXAM:  Revealed a regular rate and rhythm with normal S1 and  S2.  EXTREMITIES:  Demonstrated no edema.   Interrogation of his defibrillator demonstrates P and R-waves of 3 and 12.  Patient impedance of 450 in the atrium and 370 in the ventricle and a  threshold of 1 volt at 0.5 in atrium and 1.25 at 0.5 in the right ventricle.  Battery voltage is 3.2 volts.   IMPRESSION:  1.  Ventricular tachycardia.  2.  Status post implantable cardioverter-defibrillator.  3.  Status post ventricular  tachycardia ablation complicated by pericardial      effusion.  4.  Renal insufficiency, status post renal transplant.   DISCUSSION:  Overall, [name]. [name] is stable.  He is off all his anti-  arrhythmic drugs and has had no more VT.  His defibrillator has remained  quiet.  His blood pressure is well-controlled.  He will follow up with [place] in  1 year for ICD check.                                   Doylene Canning. Ladona Ridgel, MD   GWT/MedQ  DD:  09/11/2005  DT:  09/11/2005  Job #:  [id number]   cc:   Wilber Bihari. Caryn Section, MD

## 2010-07-20 NOTE — Op Note (Signed)
NAMELYSLE, [name] NO.:  [id number]   MEDICAL RECORD NO.:  [id number]          PATIENT TYPE:  INP   LOCATION:  2305                         FACILITY:  [facility]   PHYSICIAN:  Duke Salvia, M.D.  DATE OF BIRTH:  08/22/1960   DATE OF PROCEDURE:  DATE OF DISCHARGE:                                 OPERATIVE REPORT   PREOPERATIVE DIAGNOSIS:  Wide complex tachycardia.   POSTOPERATIVE DIAGNOSIS:  Ventricular tachycardia.   PROCEDURE:  Invasive electrophysiological study.   After obtaining informed consent, the patient was brought to the  electrophysiology laboratory and placed on the fluoroscopy table in the  supine position.  After routine prep and drape, cardiac catheterization was  performed with local anesthesia and conscious sedation.  Noninvasive blood  pressure monitoring, transcutaneous oxygen saturation monitoring, and end-  tidal CO2 monitoring were performed continuously throughout the procedure.  Following the procedure, the catheters were removed, hemostasis was  obtained, and the patient was transferred to the floor in stable condition.   CATHETERS:  A 5 French quadripolar catheter was inserted via the left  femoral vein to the AV junction.   A 5 French quadripolar catheter was inserted via the left femoral vein to  the RV apex.   A 6 French octapolar catheter was inserted via the right femoral vein to the  coronary sinus.   Source leads I, aVF, and V1 were monitored continuously throughout the  procedure.  Following the insertion of the catheters, the stimulation  protocol included incremental atrial pacing, incremental ventricular pacing,  single ventricular extra stimuli to pace cycle length of 400:600 msec,  single axis ventricular extra stimuli at 600 msec.   SOURCE ELECTROCARDIOGRAM:  Source electrocardiogram:  Rhythm is sinus.  QRS  duration was 86 msec, and the sinus rate was about 80 msec.  Unfortunately,  the data sheet is missing at  the time of this dictation.  At the end of the  procedure, measurements were essentially the same.   RESULTS:  HV interval was 50 msec.   AV nodal Wenckebach was at 530 msec.   Retrograde Wenckebach was at 500 msec.   Accessory pathway function:  No evidence of accessory pathway was  identified.   Arrhythmias induced:  A right bundle branch block, northwest axis  ventricular tachycardia, with a cycle length of about 300 msec, was  reproducibly inducible at 400:270:270-230.  It was associated with  hemodynamic compromised, with a blood pressure initially at 60 and then  repeated at 45.  After two minutes of tachycardia, it was terminated, with  the aforementioned blood pressure results.  The tachycardia could be  reproducibly terminated with ventricular burst pacing at 230 msec.   On one of these episodes, however, complete heart block ensued.  This  persisted for about five seconds, at which point the patient had a PVC and  went back into ventricular tachycardia.  Because of loss of consciousness at  this point and a polymorphic nature of the tachycardia, the patient was  shocked.  The patient had complete heart block persistently for about 20  seconds.  Thereafter, ventricular pacing supported the patient's heartbeat  and hemodynamics and recovery of his intrinsic rhythm.   IMPRESSION:  1.  Normal sinus function.  2.  Normal atrial function.  3.  Transient complete heart block following termination of ventricular      tachycardia, both by pace termination as well as by shock.  4.  Normal His Purkinje system function.  5.  No accessory pathway.  6.  Inducible ventricular tachycardia.   Please note also that the QRS duration of the ventricular tachycardia was  195 msec.   It was felt based on this that this did not represent a fascicular VT, both  because of the axis as well as the breadth of the QRS, and the ECG  morphology suggested that it was coming from more lateral than  septal.  Notwithstanding the patient's normal left ventricular function and normal  coronary arteries by catheterization, the fact that the patient has had  syncopal VT, and particularly because the patient also had complete heart  block, it was elected to proceed with dual-chamber defibrillator  implantation.       SCK/MEDQ  D:  01/10/2004  T:  01/11/2004  Job:  [id number]   cc:   Modesto Charon  [address]  [place]  [place] [address]  Fax: [phone]   Mickey __________, M.D.  [facility]

## 2010-07-20 NOTE — H&P (Signed)
NAMEBRAYTEN, [name]:  [id number]   MEDICAL RECORD NO.:  [id number]          PATIENT TYPE:  INP   LOCATION:  2305                         FACILITY:  [facility]   PHYSICIAN:  Verne Grain, MD   DATE OF BIRTH:  04-22-1960   DATE OF ADMISSION:  01/09/2004  DATE OF DISCHARGE:                                HISTORY & PHYSICAL   PRIMARY CARE PHYSICIAN:  Dr. Arlan Organ [place]).  Primary nephrologist,  Dr. Marina Gravel and Dr. Harold Hedge at [facility].   CHIEF COMPLAINT:  Syncope with wide complex tachycardia at 200 beats per  minute captured on telemetry (transferred from [facility]).   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  The patient has significant  complications of diabetes including several peripheral neuropathy,  peripheral vascular disease with left lower extremity skin lesions and  status post left third toe amputation, nephropathy, retinopathy.  The  patient also has a history of hypertension, hyperthyroidism,  gastroesophageal reflux disease symptoms.  The patient was in his usual  state of health when he was walking into a store and subsequently had a  syncopal event.  He reports no warning symptoms prior to this and was  feeling completely fine with no chest pain or shortness of breath.  He  reports being active in a home environment and has noted no recent changes  in his usual daily activities.  CPR was initiated on the patient during this  syncopal episode with two rescue breaths given and initiation of chest  compressions prior to the patient awakening.  He reportedly had some nausea  and headache, but was otherwise surprised to find himself on the ground (per  patient).  He was admitted to [facility] where he had  serial cardiac  markers that revealed no evidence of myocardial infarction.  He was being  monitored on telemetry when he had a second syncopal episode with wide  complex tachycardia at 200 beats per minute captured on telemetry.  The  patient was administered lidocaine 75 mg and amiodarone 150 mg with  cessation of wide complex tachycardia and to sinus rhythm without need for  defibrillation/electrical cardioversion.  The patient was sent to [facility]  for cardiac catheterization and probable EP study.   PAST MEDICAL HISTORY:  1.  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  2.  Peripheral vascular disease with left third toe amputation/nephropathy      with end-stage renal disease.  3.  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  4.  History of bilateral upper extremity grafts with current functioning      right forearm AV graft (placed September 2002).  5.  Hypothyroidism.  6.  Peripheral vascular disease, status post left third toe amputation (as      described above).  7.  History of gastroesophageal reflux symptoms.  8.  History of environmental allergies.  9.  Glaucoma.  10. History of cardiac catheterization 5-10 years ago which showed      nonobstructive disease overall, one small blockage and small blood      vessel that was not worth fixing.  The patient has had no other known      cardiovascular history in his past, although he does have some small Q      waves in his inferior leads suggestive of possible previous inferior      infarct.   SOCIAL HISTORY:  The patient lives in [place] with his wife of 15 years.  He has one step-daughter.  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  He has never smoked cigarettes, consumed significant  amounts of alcohol or used illicit drugs in his past.  He has no formal  exercise regimen, although  he does walk a substantial distance doing  housework, per his report.   FAMILY HISTORY:  The patient's mother is alive at age 23 with hypertension.  The patient's father is alive at age 19 with hypertension and prostate  cancer.  The patient's siblings with one sister who is healthy without  medical problems and two brothers, one of whom has hypertension.  Otherwise,  no medical problems.   ALLERGIES:  PROTAMINE which causes hypotension observed during  kidney/pancreas transplant.   MEDICATIONS:  1.  Prograf 3 mg p.o. b.i.d. at 11 a.m. and 11 p.m.  2.  CellCept 250 mg p.o. b.i.d. at 11 a.m. and 11 p.m.  3.  Prednisone 5 mg p.o. q.d.  4.  Synthroid 0.125 mcg p.o. q.d.  5.  Prilosec 20 mg p.o. q.d.  6.  Multivitamin one tablet p.o. q.h.s.  7.  Aspirin 81 mg p.o. q.o.d. at 11 p.m.  8.  Os-Cal 500 mg p.o. b.i.d.  9.  Betoptic S one drop in the right eye at 11 a.m. and one drop in both      eyes at 11 p.m.  10. Imodium AD p.r.n.  11. Tylenol allergy sinus two capsules q.4-6h. p.r.n.   REVIEW OF SYSTEMS:  CONSTITUTIONAL:  No fevers, sweats, weight change,  adenopathy.  Did have transient headache after syncopal event, but has no  headache currently.  No alterations in auditory visual acuity outside of his  syncopal event.  He does have skin lesions consistent with chronic  peripheral vascular disease on bilateral lower extremities much worse on the  left with history of left third toe amputation.  CARDIOPULMONARY:  The  patient has a history of syncope, but specifically denies chest pain,  shortness of breath, dyspnea on exertion, paroxysmal nocturnal dyspnea,  cough or wheezing.  GASTROINTESTINAL:  The patient has no bowel or bladder  complaints as current.  He has no nausea, vomiting or diaphoresis at this  time.  He does occasionally have diarrhea for which he treats with p.r.n. Imodium.  ENDOCRINE:  The patient reports no heat or cold intolerance or  skin/hair changes to suggest  endocrinologic abnormality.  NEUROLOGIC:  Psychiatric status is stable.   PHYSICAL EXAMINATION:  VITAL SIGNS:  Temperature 98.1, heart rate 78,  respirations 18, blood pressure 125/60, oxygen saturations 100% on 2 L per  nasal cannula.  Height 5 feet 11 inches.  GENERAL:  The patient is pleasant, cooperative.  HEENT:  Normocephalic, atraumatic.  Extraocular movements intact.  NECK:  Supple.  There is no bruit.  There is  no jugular venous distention.  CARDIAC:  Regular S1 and S2.  There is a systolic murmur heard in the right  upper sternal border that is consistent with aortic sclerosis without  stenosis.  S2 is well-preserved and carotid pulses are well-preserved.  LUNGS:  Clear to auscultation bilaterally, but there are occasional  scattered rhonchi that seem to clear when coughing.  SKIN:  Most notable for skin lesions on left lower extremity and bilateral  lower extremities with changes from peripheral vascular disease.  ABDOMEN:  Soft, nontender, nondistended with positive bowel sounds.  EXTREMITIES:  Lower extremity examination reveals no evidence of edema.  PVD  changes as previously described.  NEUROLOGIC:  The patient is alert, oriented, cooperative and appropriate.  Answers questions in an appropriate manner.  His neurologic exam is grossly  nonfocal.  He is able to move all four extremities without difficulty.  He  does have sensory deficits consistent with sensory peripheral neuropathy as  previously described.   LABORATORY DATA AND X-RAY FINDINGS:  [place] chest x-ray negative on  preliminary report.  [place] EKG with heart rate 95, sinus rhythm with a  normal axis, borderline first-degree AV block, normal QRS and QTC axis, Q  waves may be present in leads I, II, III and aVF.  There are no changes  diagnostic of ischemia.  The patient had some anterolateral T wave  abnormalities which are nondiagnostic and possibly secondary to strain  associated with hypertrophy for  which the patient has borderline voltage  criteria.  [place] head CT on January 08, 2004, which revealed no  intracranial or cranial abnormality.  VQ scan on January 07, 2003, read out  as low probability.   LDL 51, HDL 56, total cholesterol 161.  White blood cell count 8, hematocrit  46, platelets 168.  Sodium 142, potassium 4.3, chloride 115, bicarb 22, BUN  38, creatinine  1.7, glucose 84.  Total bilirubin 8.4, AST 48, ALT 50.  CK-  MB 54.  PT 10.4, INR 1.1, troponin I 0.03.  Alk phos 76, total bilirubin  0.4, AST 48, ALT 50, calcium 9.8.   ASSESSMENT:  This is a 50 year old male with type 1 diabetes mellitus,  status post kidney/pancreas transplant, hypertension with LDL equaling 50 on  no lipid lowering medications, hypothyroidism, glaucoma.  Current creatinine  1.7, at patient's baseline with severe peripheral vascular disease with amputation of left third toe and chronic bilateral skin changes of lower  extremities.  This patient has had a syncopal event which prompted  initiation of cardiopulmonary resuscitation with recurrent syncopal event  captured on telemetry revealing wide complex tachycardia at 200 beats per  minute.   PLAN:  1.  Syncope/wide complex tachycardia.  The patient was transferred on      amiodarone, however in light of probable EP study, we will discontinue      amiodarone and replace it with lidocaine at 1 mg per minute to allow for      an agent that can be discontinued more rapidly than the amiodarone which      is known to have a prolonged half life and prolonged time of      elimination.  We will also check a thoracic echocardiogram to further      evaluate murmur and to look for any other unexpected causes of syncope.      However, based on exam, the murmur sounds simply like aortic sclerosis      without stenosis.  The patient was instructed regarding  risks and      benefits at cardiac catheterization with subsequent decision to opt for      IV  fluids and Mucomyst to minimize risk of cardiac catheterization from      a renal standpoint.  Will check a TSH and free T4 to make sure the      patient is euthyroid on his current dose of Synthroid.  2.  Type 1 diabetes mellitus with kidney/pancreas transplant.  Continue      immunosuppressive regimen with Prograf, CellCept and prednisone as      previously prescribed.  It is notable that the patient's LDL is only 51      on no statin.  After the cardiac catheterization, if there is      significant CAD, consideration of empiric Pravachol could be considered      (attempt to minimize drug interactions between statin and      immunosuppressants).  3.  Will institute diabetic diet, although the patient reports that he does      not check his blood sugars regularly.  We will therefore not subject him      to sliding scale Humulin insulin.  However, will check a hemoglobin A1C      to assess his glycemic control prior to this hospitalization.  4.  Chronic prednisone therapy.  Will continue daily calcium and Vitamin D      supplementation as previously prescribed.  5.  History of gastroesophageal reflux disease symptoms.  Continue PPI as      previously prescribed.  6.  Hypothyroidism.  Continue Synthroid as previously prescribed with a TSH      and free T4 to ensure euthyroid state on Synthroid medication.  7.  Glaucoma.  Continue with eye drops as previously prescribed.       DDH/MEDQ  D:  01/09/2004  T:  01/09/2004  Job:  [id number]

## 2010-07-20 NOTE — H&P (Signed)
NAMEELIAV, MECHLING NO.:  [id number]   MEDICAL RECORD NO.:  [id number]          PATIENT TYPE:  INP   LOCATION:  1825                         FACILITY:  [facility]   PHYSICIAN:  Vida Roller, M.D.   DATE OF BIRTH:  1960-04-19   DATE OF ADMISSION:  04/15/2004  DATE OF DISCHARGE:                                HISTORY & PHYSICAL   PHYSICIANS:  1.  Arlan Organ, M.D., primary care physician, in [place], [place].  2.  Harl Bowie, M.D., primary cardiologist, in [place], [place].  3.  Doylene Canning. Ladona Ridgel, M.D., secondary cardiologist.   CHIEF COMPLAINT:  Defibrillator discharges.   HISTORY OF PRESENT ILLNESS:  Mr. Housand is a 50 year old male with a history  of minimal coronary artery disease by catheterization in 2005 and wide  complex tachycardia associated with syncope. He had an ICD placed in  December of 2005. His ICD fired and he was started on sotalol. Yesterday and  today, he had two more ICD discharges. He is here today in the emergency  room for evaluation and will be admitted. He has no chest pain. He is  currently in sinus rhythm.   PAST MEDICAL HISTORY:  1.  Significant for cardiac catheterization in November 2005 with 20% LAD as      the only lesion noted.  2.  History of end-stage renal disease, status post kidney and pancreas      transplant in 2002.  3.  Diabetes.  4.  Hypertension.  5.  Chronic renal insufficiency.  6.  Hypothyroidism.  7.  History of normal EF by echocardiogram 2005.  8.  Status post [employer]. Jude ICD dual-chamber secondary to transient heart      block.  9.  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  10. History of peripheral neuropathy.  11. Gastroesophageal reflux disease.  12. Glaucoma.   PAST SURGICAL HISTORY:  1.  Status post kidney and pancreas transplant.  2.  Toe amputation.  3.  Dialysis grafts.  4.  ICD.  5.  Cardiac catheterization.   ALLERGIES:  PROTAMINE.   MEDICATIONS:  The patient states there have been no changes since discharge  on April 09, 2004.  1.  Sotalol 120 mg q.d.  2.  Prograf 1 mg 3 tablets b.i.d.  3.  Prednisone 5 mg q.d.  4.  Synthroid 125 mcg q.d.  5.  Azathioprine 50 mg 1/2 tablets q.d.  6.  Aspirin 81 mg q.d.  7.  Axid 150 mg q.d.  8.  Lipitor 5 mg q.h.s.  9.  Imuran 75 mg q.d.  10. Betoptic 1 drop in the left eye q.d. and 1 drop in the right eye b.i.d.   SOCIAL HISTORY:  Lives in [place] with his wife. He is a former Production designer, theatre/television/film. He has no history of alcohol, tobacco, or drug use.   FAMILY HISTORY:  His mother is in her 58s and his father is in his 30s.  Neither one has a history of heart disease  and he has two siblings with no  heart disease. There is no history of arrhythmia, premature coronary artery  disease or sudden death in any family member that he knows of.   REVIEW OF SYMPTOMS:  Significant for recent diarrhea, three episodes  yesterday and two episodes today. He had problems with diarrhea at one point  but this was found to be secondary to medications and his symptoms resolved  once the medications were changed. However, he denies recent medication  changes. He denies urinary frequency. He has no reflux symptoms on the  current medications. He denies chest pain, shortness of breath. He has some  chronic dyspnea on exertion but this has not changed recently. He has no  orthopnea or PND. Review of systems is otherwise negative.   PHYSICAL EXAMINATION:  GENERAL:  He is a well-developed, well-nourished  white male in no acute distress.  HEENT:  Head is normocephalic and atraumatic with pupils are equal, round,  and reactive to light and accommodation. Extraocular movements intact.  Sclerae are clear.  NECK:  There is no lymphadenopathy, thyromegaly, bruit, or JVD noted.  CARDIOVASCULAR:  His heart is regular in rate and rhythm with a S4 and a  soft systolic murmur.   LUNGS:  Clear to auscultation bilaterally.  SKIN:  No rashes or lesions are noted.  ABDOMEN:  Soft and nontender with active bowel sounds.  EXTREMITIES:  There is no clubbing, cyanosis, or edema. He has some skin  changes to his lower extremities that appear to be from chronic arterial  insufficiencies. His distal pulses are slightly decreased but easily  palpable.  NEUROLOGICAL:  He is alert and oriented with cranial nerves II through XII  grossly intact.  MUSCULOSKELETAL:  No joint deformity or effusions.   LABORATORY DATA:  Chest x-ray is pending. EKG is pending. Labs are pending.   ASSESSMENT/PLAN:  1.  Defibrillator discharge: Dr. Doylene Canning. Ladona Ridgel was contacted. He stated      that there was no need to get the device interrogated today as he was      still having episodes of wide complex tachycardia on recent admission.      He stated that he would not recommend any medical changes today unless      he has further episodes of ventricular tachycardia on telemetry.      Recommend with continuing the sotalol. He will be evaluated by      electrophysiologist in a.m. for consideration of possible ventricular      tachycardia ablation. Potassium and magnesium will be followed and      supplemented on a p.r.n. basis.  2.  The patient is otherwise stable and will be continued on his home      medications. Renal team will be notified of his admission with a consult      obtained on a p.r.n. basis.   DISPOSITION:  Dr. Vida Roller saw the patient and determined the plan of  care.      RB/MEDQ  D:  04/15/2004  T:  04/15/2004  Job:  [id number]

## 2010-07-20 NOTE — Op Note (Signed)
. [facility]  Patient:    [name], [name] Visit Number: [id number] MRN: [id number]          Service Type: DSU Location: [facility] 2899 [id number] Attending Physician:  Alyson Locket Dictated by:   Larina Earthly, M.D. Proc. Date: 11/18/00 Admit Date:  11/18/2000                             Operative Report  PREOPERATIVE DIAGNOSIS:  End-stage renal disease.  POSTOPERATIVE DIAGNOSIS:  End-stage renal disease.  OPERATION PERFORMED:  Placement of right forearm loop arteriovenous graft.  SURGEON:  Larina Earthly, M.D.  ASSISTANT:  Tollie Pizza. Thomasena Edis, P.A.  ANESTHESIA:  MAC.  COMPLICATIONS:  None.  DISPOSITION:  To recovery room stable.  DESCRIPTION OF PROCEDURE:  The patient was taken to the operating room and placed in supine position where the area of the right arm was prepped and draped in the usual sterile fashion.  Incision was made through a prior antecubital incision and carried down to isolate the brachial artery and brachial vein.  The patient had had prior anastomoses to the brachial artery and there was a patch at the prior arterial anastomosis.  There had not been prior anastomosis to the brachial vein and the vein was of good caliber.  A separate incision was made over the distal forearm and a loop configuration was created.  A 6 mm standard wall stretch  graft was brought through the tunnel in loop configuration.  The vein was occluded proximally and distally and was opened with an 11 blade and extended longitudinally with Potts scissors. The graft was sewn end-to-side to the vein with a running 6-0 Prolene suture.  Clamps were removed from the vein and the graft was flushed with heparinized saline and re-occluded.  Next the artery was occluded proximally and distally, was opened with an 11 blade and extended longitudinally with Potts scissors.  The graft was cut to the appropriate length and sewn end-to-side to the artery with a running  6-0 Prolene suture. Clamps were removed and an excellent thrill was noted.  The wound was irrigated with saline, hemostasis electrocautery.  Wounds closed with 3-0 Vicryl in the subcutaneous and subcuticular tissues.  Benzoin and Steri-Strips were applied. Dictated by:   Larina Earthly, M.D. Attending Physician:  Alyson Locket DD:  11/18/00 TD:  11/18/00 Job: [id number]

## 2010-08-12 ENCOUNTER — Encounter: Payer: Self-pay | Admitting: Internal Medicine

## 2010-08-12 ENCOUNTER — Emergency Department (HOSPITAL_COMMUNITY): Payer: Medicare Other

## 2010-08-12 ENCOUNTER — Inpatient Hospital Stay (HOSPITAL_COMMUNITY)
Admission: EM | Admit: 2010-08-12 | Discharge: 2010-08-17 | DRG: 255 | Disposition: A | Discharge status: Home / Self Care | Payer: Medicare Other | Attending: Internal Medicine | Admitting: Internal Medicine

## 2010-08-12 DIAGNOSIS — Y830 Surgical operation with transplant of whole organ as the cause of abnormal reaction of the patient, or of later complication, without mention of misadventure at the time of the procedure: Secondary | ICD-10-CM | POA: Diagnosis present

## 2010-08-12 DIAGNOSIS — L97909 Non-pressure chronic ulcer of unspecified part of unspecified lower leg with unspecified severity: Secondary | ICD-10-CM | POA: Diagnosis present

## 2010-08-12 DIAGNOSIS — L02619 Cutaneous abscess of unspecified foot: Secondary | ICD-10-CM | POA: Diagnosis present

## 2010-08-12 DIAGNOSIS — Z8673 Personal history of transient ischemic attack (TIA), and cerebral infarction without residual deficits: Secondary | ICD-10-CM

## 2010-08-12 DIAGNOSIS — H5440 Blindness, one eye, unspecified eye: Secondary | ICD-10-CM | POA: Diagnosis present

## 2010-08-12 DIAGNOSIS — Z992 Dependence on renal dialysis: Secondary | ICD-10-CM

## 2010-08-12 DIAGNOSIS — Z8674 Personal history of sudden cardiac arrest: Secondary | ICD-10-CM

## 2010-08-12 DIAGNOSIS — I2510 Atherosclerotic heart disease of native coronary artery without angina pectoris: Secondary | ICD-10-CM | POA: Diagnosis present

## 2010-08-12 DIAGNOSIS — D638 Anemia in other chronic diseases classified elsewhere: Secondary | ICD-10-CM | POA: Diagnosis present

## 2010-08-12 DIAGNOSIS — H409 Unspecified glaucoma: Secondary | ICD-10-CM | POA: Diagnosis present

## 2010-08-12 DIAGNOSIS — E109 Type 1 diabetes mellitus without complications: Secondary | ICD-10-CM | POA: Diagnosis present

## 2010-08-12 DIAGNOSIS — K219 Gastro-esophageal reflux disease without esophagitis: Secondary | ICD-10-CM | POA: Diagnosis present

## 2010-08-12 DIAGNOSIS — Z85828 Personal history of other malignant neoplasm of skin: Secondary | ICD-10-CM

## 2010-08-12 DIAGNOSIS — I70269 Atherosclerosis of native arteries of extremities with gangrene, unspecified extremity: Principal | ICD-10-CM | POA: Diagnosis present

## 2010-08-12 DIAGNOSIS — T8610 Unspecified complication of kidney transplant: Secondary | ICD-10-CM | POA: Diagnosis present

## 2010-08-12 DIAGNOSIS — I428 Other cardiomyopathies: Secondary | ICD-10-CM | POA: Diagnosis present

## 2010-08-12 DIAGNOSIS — S98139A Complete traumatic amputation of one unspecified lesser toe, initial encounter: Secondary | ICD-10-CM

## 2010-08-12 DIAGNOSIS — Z79899 Other long term (current) drug therapy: Secondary | ICD-10-CM

## 2010-08-12 DIAGNOSIS — Z23 Encounter for immunization: Secondary | ICD-10-CM

## 2010-08-12 DIAGNOSIS — Z9483 Pancreas transplant status: Secondary | ICD-10-CM

## 2010-08-12 DIAGNOSIS — E039 Hypothyroidism, unspecified: Secondary | ICD-10-CM | POA: Diagnosis present

## 2010-08-12 DIAGNOSIS — G589 Mononeuropathy, unspecified: Secondary | ICD-10-CM | POA: Diagnosis present

## 2010-08-12 DIAGNOSIS — IMO0002 Reserved for concepts with insufficient information to code with codable children: Secondary | ICD-10-CM

## 2010-08-12 DIAGNOSIS — L03039 Cellulitis of unspecified toe: Secondary | ICD-10-CM | POA: Diagnosis present

## 2010-08-12 DIAGNOSIS — N186 End stage renal disease: Secondary | ICD-10-CM | POA: Diagnosis present

## 2010-08-12 DIAGNOSIS — I120 Hypertensive chronic kidney disease with stage 5 chronic kidney disease or end stage renal disease: Secondary | ICD-10-CM | POA: Diagnosis present

## 2010-08-12 DIAGNOSIS — E785 Hyperlipidemia, unspecified: Secondary | ICD-10-CM | POA: Diagnosis present

## 2010-08-12 DIAGNOSIS — N2581 Secondary hyperparathyroidism of renal origin: Secondary | ICD-10-CM | POA: Diagnosis present

## 2010-08-12 DIAGNOSIS — Z95810 Presence of automatic (implantable) cardiac defibrillator: Secondary | ICD-10-CM

## 2010-08-12 DIAGNOSIS — S98119A Complete traumatic amputation of unspecified great toe, initial encounter: Secondary | ICD-10-CM

## 2010-08-12 LAB — RENAL FUNCTION PANEL
Albumin: 2.9 g/dL — ABNORMAL LOW (ref 3.5–5.2)
BUN: 69 mg/dL — ABNORMAL HIGH (ref 6–23)
Calcium: 9.5 mg/dL (ref 8.4–10.5)
Creatinine, Ser: 9.06 mg/dL — ABNORMAL HIGH (ref 0.4–1.5)
Glucose, Bld: 90 mg/dL (ref 70–99)
Phosphorus: 3.5 mg/dL (ref 2.3–4.6)
Potassium: 4.9 mEq/L (ref 3.5–5.1)

## 2010-08-12 LAB — HEPATIC FUNCTION PANEL
ALT: 12 U/L (ref 0–53)
Alkaline Phosphatase: 140 U/L — ABNORMAL HIGH (ref 39–117)
Bilirubin, Direct: 0.1 mg/dL (ref 0.0–0.3)
Indirect Bilirubin: 0.1 mg/dL — ABNORMAL LOW (ref 0.3–0.9)
Total Bilirubin: 0.2 mg/dL — ABNORMAL LOW (ref 0.3–1.2)

## 2010-08-12 LAB — PROTIME-INR
INR: 1.13 (ref 0.00–1.49)
Prothrombin Time: 14.7 seconds (ref 11.6–15.2)

## 2010-08-12 LAB — CBC
HCT: 35.8 % — ABNORMAL LOW (ref 39.0–52.0)
Hemoglobin: 12 g/dL — ABNORMAL LOW (ref 13.0–17.0)
MCHC: 33.8 g/dL (ref 30.0–36.0)
MCHC: 34.8 g/dL (ref 30.0–36.0)
Platelets: 197 10*3/uL (ref 150–400)
Platelets: 193 10*3/uL (ref 150–400)
RDW: 16.2 % — ABNORMAL HIGH (ref 11.5–15.5)
RDW: 16.1 % — ABNORMAL HIGH (ref 11.5–15.5)
WBC: 6.7 10*3/uL (ref 4.0–10.5)

## 2010-08-12 LAB — DIFFERENTIAL
Basophils Absolute: 0 10*3/uL (ref 0.0–0.1)
Basophils Relative: 1 % (ref 0–1)
Eosinophils Absolute: 0.2 10*3/uL (ref 0.0–0.7)
Monocytes Absolute: 0.6 10*3/uL (ref 0.1–1.0)
Neutro Abs: 5.7 10*3/uL (ref 1.7–7.7)

## 2010-08-12 LAB — POCT I-STAT, CHEM 8
Calcium, Ion: 1.17 mmol/L (ref 1.12–1.32)
Chloride: 101 mEq/L (ref 96–112)
HCT: 37 % — ABNORMAL LOW (ref 39.0–52.0)
Hemoglobin: 12.6 g/dL — ABNORMAL LOW (ref 13.0–17.0)
TCO2: 32 mmol/L (ref 0–100)

## 2010-08-12 NOTE — Progress Notes (Signed)
Hospital Admission Note Date: 08/12/2010  Patient name:  [name]  Medical record number:  [id number] Date of birth:  1960-05-27   Age: 50 y.o. Gender:  male PCP:    Everlean Cherry, MD   Medical Service:   Internal Medicine Teaching Service   Attending physician:  Dr. Margarito Liner First Contact:   Dr. Saralyn Pilar Pager: [phone]  Second Contact:   Dr. Scot Dock  Pager: [phone] After Hours:    First Contact  Pager: [phone]      Second Contact  Pager: [phone]   Chief Complaint: Right second toe discoloration.  History of Present Illness: Patient is a 50 y.o. male with a PMHx of DMI s/p pancreatic transplant, ESRD on HD (MWF), severe peripheral neuropathy who presents to [facility] for evaluation of Right second toe discoloration x 1 day. Patient notes that he had a right second toenail infection a few months ago for which his toenail was removed by Dr. Zachery Dakins. On 2 days prior to admission, patient noted a purple discoloration of his toe, and that it felt more cold to touch. Patient called Dr. Annette Stable office and was started on Keflex. However discoloration of his toe continued to progress and the toe turned black in color with decreased sensation on one day prior to admission. Patient otherwise denied fevers, chills, toe or foot lesions, discharge from infected toe. Denied recent trauma, falls.   Current Outpatient Medications: Medication Sig  . azaTHIOprine (IMURAN) 50 MG tablet Take 1 and a half tablets once daily.   . B Complex-C-Folic Acid (B COMPLEX-VITAMIN C-FOLIC ACID) 1 MG tablet Take 1 tablet by mouth daily.    . bimatoprost (LUMIGAN) 0.03 % ophthalmic drops 1 gtt once daily   . Calcium Polycarbophil (EQUALACTIN PO) Take 2 tablets by mouth 2 (two) times daily.    . cetirizine (ZYRTEC) 10 MG tablet Take 10 mg by mouth daily.    [name] lanthanum (FOSRENOL) 1000 MG chewable tablet 1 tab four times a day   . levothyroxine (SYNTHROID, LEVOTHROID) 200 MCG tablet Take 200 mcg by mouth  daily.    [name] loperamide (IMODIUM A-D) 2 MG tablet Take 2 mg by mouth 2 (two) times daily.    [name] losartan (COZAAR) 25 MG tablet Take 25 mg by mouth daily.    . Lutein 20 MG TABS Take 1 tablet by mouth daily.    . midodrine (PROAMATINE) 5 MG tablet Take one by mouth before dialysis and if BP < 100   . nizatidine (AXID) 150 MG capsule Take 150 mg by mouth daily.    . Omega-3 Fatty Acids (SALMON OIL) 200 MG CAPS 100mg  once daily   . phenytoin (DILANTIN) 100 MG ER capsule Take by mouth 3 (three) times daily.    . predniSONE (DELTASONE) 5 MG tablet Take 5 mg by mouth daily.    . rosuvastatin (CRESTOR) 5 MG tablet Take one tablet by mouth every other day.   . tacrolimus (PROGRAF) 1 MG capsule 2 capsules, two times a day   . vitamin C (ASCORBIC ACID) 500 MG tablet Take 500 mg by mouth 2 (two) times daily.      Allergies: Allergen Reactions  . Ceftriaxone Sodium Nausea And Vomiting  . Protamine     Significant hypotension?  . Ciprofloxacin Rash  . Moxifloxacin Rash   Past Medical History: Diagnosis Date  . Secondary hyperparathyroidism, renal     In setting of ESRD.  [name] Hypothyroidism   . Automatic implantable cardiac defibrillator in situ  History of implanted dual-chamber ICD in 01/2004, with removal and insertion of a Bi-V ICD in 08/2009 by Dr. 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 Hypertension   . Nonischemic cardiomyopathy     2D-echocardiogram (08/2007) - LV EF 45%, akinesis of inferoseptal wall, inferior wall,   . ESRD on hemodialysis Started age 89yo    on MWF schedule. Previous failed renal transplant 1998 at [facility] (with rejection in 1998). 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 Resumed HD 10/2006.   [name] DM type 1 (diabetes mellitus, type 1) DX: age 43yo    previously well controlled with Aic 5 (12/2008)  . Intracranial hemorrhage     History of. On prophylactic dilantin.  .  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  . Hyperlipidemia   . Hemopericardium     History of in setting of failed radiogrequency ablation for Vtach  . Anemia of chronic disease     BL Hgb 11-13. In setting of ESRD.   [name] Peptic ulcer disease     Unknown history, no records per [employer].   . Cholelithiasis     Noted at least since 01/2006. Asymptomatic.  . Erectile dysfunction   . Coronary artery disease     nonobstructive per cardiac cath (01/2004). LAD has 20% multiple discrete lesions in med and distal portion.   . Peripheral autonomic neuropathy due to diabetes mellitus   . GERD (gastroesophageal reflux disease)   . Glaucoma   . Squamous cell skin cancer, finger     History of. Marginal resection in 05/2008 with recurrent infection/ lytic lesion involving distal phalanx  . Toe amputation status     Ischemic fourth and fifith toe left (03/2009), history of previous 1,2,3 toe  amputations.     Past Surgical History: Procedure Date  . Cardiac defibrillator placement 11/05    [employer] Jude  . Transplant pancreatic allograft 2002  . Toe amputation 03/2009    left 4th, 5th toes. Previous 1,2, 3 toe left.  . Kidney transplant 2002   Family History: Problem Relation Age of Onset  . Hypertension Brother   . Hypertension Sister   . Coronary artery disease Mother     x 5 vessel graft in her 5s  . Hypertension Mother   . Prostate cancer Father   . Hypertension Father   . Hypertension Brother    Social History: Social History  . Marital Status: Married    Spouse Name: N/A    Number of Children: 0  . Years of Education: 12th grade   Occupational History  . UNEMPLOYED    Social History Main Topics  . Smoking status: Former Smoker    Quit date: 03/04/1985  . Smokeless tobacco: None  . Alcohol Use: No     previously drank heavily from 1980-1987  . Drug Use: No  . Sexually Active: None   Social History  Narrative   The patient lives in [place] with his wife and stepdaughter.  He denies any tobacco use.  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     Review of Systems: Pertinent items are noted in HPI.  Vital Signs: T: 98.3 P: 77 BP: 127/60 RR: 18 O2 sat: 93% on RA   Physical Exam:  General: Vital signs reviewed and noted. Well-developed, well-nourished, in no acute distress; alert, appropriate and cooperative throughout examination.  Head: Normocephalic, atraumatic.  Eyes: PERRL, EOMI, No signs of anemia or jaundince.  Nose: Mucous membranes moist, not inflammed, nonerythematous.  Throat: Oropharynx nonerythematous, no exudate appreciated.   Neck: No deformities, masses, or tenderness noted.Supple, No carotid Bruits, no JVD.  Lungs:  Normal respiratory effort. Clear to auscultation BL without crackles or wheezes.  Heart: RRR. S1 and S2 normal without gallop, murmur, or rubs.  Abdomen:  BS normoactive. Soft, Nondistended, non-tender.  No masses or organomegaly.  Extremities: No pretibial edema. Right necrotic 2nd toe, cool to touch. Reduced sensation bilateral feet. Faint DP/ TP pulses bilaterally.  Neurologic: A&O X3, CN II - XII are grossly intact. Motor strength is 5/5 in the all 4 extremities, Sensations intact to light touch, Cerebellar signs negative.  Skin: No visible rashes, scars.   Lab results: CBC: WBC                                      7.1               4.0-10.5         K/uL RBC                                      3.63       l      4.22-5.81        MIL/uL Hemoglobin (HGB)                         12.0       l      13.0-17.0        g/dL Hematocrit (HCT)                         34.5       l      39.0-52.0        % MCV                                      95.0              78.0-100.0       fL MCH -                                    33.1              26.0-34.0         pg MCHC                                     34.8              30.0-36.0        g/dL RDW                                      16.1  h      11.5-15.5        % Platelet Count (PLT)                     193               150-400          K/uL Neutrophils, %                           80         h      43-77            % Lymphocytes, %                           8          l      12-46            % Monocytes, %                             8                 3-12             % Eosinophils, %                           3                 0-5              % Basophils, %                             1                 0-1              % Neutrophils, Absolute                    5.7               1.7-7.7          K/uL Lymphocytes, Absolute                    0.6        l      0.7-4.0          K/uL Monocytes, Absolute                      0.6               0.1-1.0          K/uL Eosinophils, Absolute                    0.2               0.0-0.7          K/uL Basophils, Absolute                      0.0               0.0-0.1          K/uL  Renal  Function Panel: Sodium (NA)                              139               135-145          mEq/L Potassium (K)                            4.9               3.5-5.1          mEq/L Chloride                                 97                96-112           mEq/L CO2                                      28                19-32            mEq/L Glucose                                  90                70-99            mg/dL BUN                                      69         h      6-23             mg/dL Creatinine                               9.06       h      0.4-1.5          mg/dL GFR, Est Non African American            6          l      >60              mL/min Albumin-Blood                            2.9        l      3.5-5.2          g/dL Calcium                                  9.5               8.4-10.5         mg/dL Phosphorus  3.5               2.3-4.6          mg/dL  Coagulation studies: PTT(a-Partial Thromboplastn Time)        37                24-37            Seconds Protime ( Prothrombin Time)              14.7              11.6-15.2        seconds INR                                      1.13              0.00-1.49   Imaging results:  1) Right Foot XRay - No evidence of bony abnormality.  Assessment & Plan: 1) Right necrotic second toe - Patient has a history of severe peripheral vascular disease, with multiple prior distal amputations. No evidence of osteomyelitis as per right foot xray, however, will treat empirically with antibiotics.  - General surgery (Dr. Zachery Dakins) consulted in ED, awaiting further input regarding surgical intervention. - Empiric Vancomycin and Zosyn. - Check coagulation studies (PT/INR, PTT) in preparation for if surgical intervention needed.  2) ESRD on HD - patient is regularly on MWF HD schedule at [facility]. No evidence of uremia. - Appreciate renal consult to maintain on regular HD schedule.  3) Hypothyroidism - asymptomatic. - Check TSH. - Continue Synthroid at current dosage with adjustment of dosage as indicated.  4) HTN - stable. - Continue on home Losartan.  - Continue PRN midodrine.   5) DM, type 1, s/p pancreatic transplant - HgA1c 5 in 12/2008, no longer on insulin therapy.  - Check HgA1c.  6) S/P renal and pancreatic transplants - continue immunosuppresive treatment with Prograf, Prednisone, and Azathioprine.   7) DVT PPX - Heparin.     Johnette Abraham, D.O. (PGY1):  ____________________________________    Date/ Time:      ____________________________________     Bethel Born, M.D. (PGY2):   ____________________________________    Date/ Time:      ____________________________________     I have seen and examined the patient. I reviewed the resident/fellow note and agree with the findings and plan of care as documented. My  additions and revisions are included.   Signature:  ____________________________________________     Internal Medicine Teaching Service Attending    Date:    ____________________________________________

## 2010-08-13 DIAGNOSIS — L089 Local infection of the skin and subcutaneous tissue, unspecified: Secondary | ICD-10-CM

## 2010-08-13 LAB — RENAL FUNCTION PANEL
Albumin: 2.7 g/dL — ABNORMAL LOW (ref 3.5–5.2)
BUN: 78 mg/dL — ABNORMAL HIGH (ref 6–23)
Calcium: 9.3 mg/dL (ref 8.4–10.5)
Creatinine, Ser: 9.96 mg/dL — ABNORMAL HIGH (ref 0.4–1.5)
GFR calc non Af Amer: 6 mL/min — ABNORMAL LOW (ref >60)

## 2010-08-13 LAB — HEMOGLOBIN A1C: Hgb A1c MFr Bld: 5 % (ref <5.7)

## 2010-08-13 LAB — CBC
HCT: 32.6 % — ABNORMAL LOW (ref 39.0–52.0)
MCH: 31.7 pg (ref 26.0–34.0)
MCV: 94.8 fL (ref 78.0–100.0)
Platelets: 196 10*3/uL (ref 150–400)
RDW: 16.1 % — ABNORMAL HIGH (ref 11.5–15.5)

## 2010-08-14 ENCOUNTER — Inpatient Hospital Stay (HOSPITAL_COMMUNITY): Payer: Medicare Other

## 2010-08-14 DIAGNOSIS — I739 Peripheral vascular disease, unspecified: Secondary | ICD-10-CM

## 2010-08-14 DIAGNOSIS — M629 Disorder of muscle, unspecified: Secondary | ICD-10-CM

## 2010-08-14 LAB — CBC
HCT: 32.3 % — ABNORMAL LOW (ref 39.0–52.0)
Hemoglobin: 11.1 g/dL — ABNORMAL LOW (ref 13.0–17.0)
MCH: 32.1 pg (ref 26.0–34.0)
MCHC: 34.4 g/dL (ref 30.0–36.0)
RDW: 16.1 % — ABNORMAL HIGH (ref 11.5–15.5)

## 2010-08-14 LAB — RENAL FUNCTION PANEL
Albumin: 2.7 g/dL — ABNORMAL LOW (ref 3.5–5.2)
BUN: 90 mg/dL — ABNORMAL HIGH (ref 6–23)
Calcium: 9.5 mg/dL (ref 8.4–10.5)
Creatinine, Ser: 11.77 mg/dL — ABNORMAL HIGH (ref 0.4–1.5)
Glucose, Bld: 84 mg/dL (ref 70–99)
Phosphorus: 3.8 mg/dL (ref 2.3–4.6)
Potassium: 5 mEq/L (ref 3.5–5.1)

## 2010-08-15 ENCOUNTER — Inpatient Hospital Stay (HOSPITAL_COMMUNITY): Payer: Medicare Other

## 2010-08-15 DIAGNOSIS — I70269 Atherosclerosis of native arteries of extremities with gangrene, unspecified extremity: Secondary | ICD-10-CM

## 2010-08-15 LAB — CBC
HCT: 32.5 % — ABNORMAL LOW (ref 39.0–52.0)
MCH: 32.2 pg (ref 26.0–34.0)
MCHC: 33.8 g/dL (ref 30.0–36.0)
MCV: 95 fL (ref 78.0–100.0)
Platelets: 203 10*3/uL (ref 150–400)
RDW: 15.8 % — ABNORMAL HIGH (ref 11.5–15.5)

## 2010-08-15 LAB — RENAL FUNCTION PANEL
Albumin: 2.7 g/dL — ABNORMAL LOW (ref 3.5–5.2)
BUN: 40 mg/dL — ABNORMAL HIGH (ref 6–23)
Calcium: 9.4 mg/dL (ref 8.4–10.5)
Chloride: 99 mEq/L (ref 96–112)
Creatinine, Ser: 8.27 mg/dL — ABNORMAL HIGH (ref 0.4–1.5)
GFR calc non Af Amer: 7 mL/min — ABNORMAL LOW (ref >60)
Phosphorus: 4.6 mg/dL (ref 2.3–4.6)

## 2010-08-15 NOTE — Consult Note (Signed)
NAMETOBENNA, NEEDS NO.:  [id number]  MEDICAL RECORD NO.:  [id number]  LOCATION:  6714                         FACILITY:  [facility]  PHYSICIAN:  Di Kindle. Edilia Bo, M.D.DATE OF BIRTH:  1960/05/10  DATE OF CONSULTATION:  08/14/2010 DATE OF DISCHARGE:                                CONSULTATION   REASON FOR CONSULTATION:  Dry gangrene of the right second toe.  HISTORY:  This is a pleasant 50 year old gentleman who was admitted on August 12, 2010, with a dry gangrene of the right second toe.  This patient states that he had had a toenail removed on the right foot approximately a month ago.  This had been healing fine.  Approximately 4 days ago, he noticed some purplish discoloration of the right second toe and on by Sunday, this had become the following day.  This had progressed to a black right second toe.  He presented to the emergency room 2 days ago and was admitted for intravenous antibiotics.  Vascular Surgery was consulted today because of the concern for tibial artery occlusive disease.  The patient has been ambulatory.  He does not give any specific history of claudication, rest pain.  He has had previous wounds on the left foot and has had a transmetatarsal amputation by Dr. Zachery Dakins.  He denies having had any fever or chills recently.  He states that the toe wound on the right progressed very rapidly and currently is not especially painful.  PAST MEDICAL HISTORY: 1. Significant for diabetes and he is status post a pancreatic     transplant which is functioning.  He also has chronic kidney     disease stage V.  He has had two failed kidney transplant and he     dialyzes via a left thigh AV graft.  He apparently had a right AV     graft placed approximately 2 years ago, which had immediate removed     approximately 3 days after it was placed because of action.  In     addition, the patient has a history of hypertension,     hypercholesterolemia,  third-degree heart block, atrial     fibrillation, he had previous cardiac arrest for ventricular     fibrillation and has an AICD. 2. History of intracranial hemorrhage. 3. History of gastroesophageal reflux disease. 4. History of hypothyroidism. 5. History of anemia of chronic disease. 6. History of hyperparathyroidism.  FAMILY HISTORY:  He is unaware of any history of premature cardiovascular disease.  SOCIAL HISTORY:  He lives in [place] with his wife.  He does not use tobacco.  REVIEW OF SYSTEMS:  GENERAL:  He has had no recent weight loss or weight gain.  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  He has had some sore throat.  CARDIOVASCULAR:  He has had no recent chest pain, chest pressure, or palpitations.  PULMONARY: He has had no productive cough, bronchitis, asthma, or wheezing.  GI: He has had no change in his bowel habits.  He does not have any history of reflux.  NEUROLOGIC:  He has had no dizziness, blackouts, headaches, or seizures.  MUSCULOSKELETAL:  He does have arthritis in both hands. GU:  He is anuric.  PSYCHIATRIC:  He has had no depression or anxiety.  PHYSICAL EXAMINATION:  GENERAL:  This is a pleasant 50 year old gentleman who appears his stated age. VITAL SIGNS:  His temperature is 98.6, heart rate is 68, blood pressure 120/62, saturation 97%. HEENT:  Normocephalic, atraumatic.  Pupils are equal, round, and reactive. NECK:  Supple. PULMONARY:  Lungs are clear bilaterally to auscultation without rales, rhonchi, or wheezing. CARDIOVASCULAR:  I do not detect any carotid bruits.  He has a regular rate and rhythm. EXTREMITIES:  He has palpable femoral and popliteal pulses bilaterally. I cannot palpate dorsalis pedis or posterior tibial pulses bilaterally. He has no significant lower extremity swelling.  He has a functioning left thigh AV graft with a bruit and thrill. ABDOMEN:  Soft and nontender with multiple scars  for his previous transplants.  He has normal-pitched bowel sounds. NEUROLOGIC:  He has no focal weakness or paresthesias. SKIN:  He has dry gangrene of the right second toe with some cellulitis in the foot.  I have reviewed his x-rays of the foot, which showed no evidence of bony abnormality in the right foot.  I have also reviewed his most recent chest x-ray, which was in March 2011, which was unremarkable.  In addition, I have independently interpreted his arterial Doppler study, which shows falsely elevated ABIs bilaterally.  The vessels are very calcific, cannot be compressed.  ABIs greater than 100% bilaterally.  He has monophasic signals in the right dorsalis pedis and posterior tibial positions and monophasic signals on the left.  In the left PT, the DP on the left was absent.  He has had duplex imaging of the right lower extremity, which shows triphasic waveforms throughout the common femoral and popliteal vessels bilaterally.  The right anterior tibial is biphasic and posterior tibial is monophasic by duplex.  I have reviewed his labs, which show a white count of 6.9, hemoglobin 11.1, hematocrit 32.3, platelets 204,000.  Potassium is 5.0.  Blood cultures are negative to date.  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  I think he is at risk for nonhealing of a toe amputation because of his tibial artery occlusive disease and I have recommended that we proceed with arteriography to further assess his circulation.  If it appears that he has adequate circulation to heal a toe amputation, then he could potentially undergo toe amputation.  If he has severe tibial artery occlusive disease, we will evaluate him for possible revascularization. We have added him onto the schedule today and I have explained that we will do everything we can to get his dye study done tomorrow, but it  is possible that we will get bump until later in the week.  Pending the results of his arteriogram, we can see what options he might have for revascularization if necessary.  In the meantime, I would agree with continuing his antibiotics.  He is currently receiving vancomycin and Zosyn.     Di Kindle. Edilia Bo, M.D.     CSD/MEDQ  D:  08/14/2010  T:  08/15/2010  Job:  [id number]  cc:   Medical Teaching Service Anselm Pancoast. Zachery Dakins, M.D.  Electronically Signed by Waverly Ferrari M.D. on 08/15/2010 12:56:54 PM

## 2010-08-15 NOTE — Consult Note (Signed)
NAMEWINDSOR, GOEKEN NO.:  [id number]  MEDICAL RECORD NO.:  [id number]  LOCATION:  6714                         FACILITY:  [facility]  PHYSICIAN:  Di Kindle. Edilia Bo, M.D.DATE OF BIRTH:  1960/03/30  DATE OF CONSULTATION: DATE OF DISCHARGE:                                CONSULTATION   DIAGNOSIS:  Gangrene, right second toe.  HISTORY OF PRESENT ILLNESS:  This is a 50 year old white male with type 1 diabetes since the age of 48.  He has had all five toes on the left foot amputated.  Approximately 2 months ago, he presented to Dr. Zachery Dakins who took off the toenail of his right second toe.  The patient states he got an infection, and his wife has been doing daily dressing changes.  On Thursday when she changed the bandage, she noticed that his toe at that time was pale.  By Friday when she changes the bandage again, the second toe was purple.  That is when he began to notice pain in the ball of his foot.  By Saturday, the toe was black, and he presented to the emergency department with increasing pain and redness to his right foot.  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  He does have a left thigh graft that is working and a history of a right thigh graft that was removed due to infection. Vascular Surgery is consulted.  PAST MEDICAL/PAST SURGICAL HISTORY: 1. Gangrene/cellulitis, right second toe. 2. End-stage renal disease.     a.     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     b.     Multiple access surgeries.     c.     Working left thigh graft.     d.     History of right thigh graft that was removed due to      infection. 3. Type 1 diabetes since age 32.     a.     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 4. Hypertension. 5. Hypercholesterolemia. 6. History of third degree heart block. 7. Atrial fibrillation, history of VFib with CPR.     a.      Status post permanent pacemaker with AICD. 8. Glaucoma. 9. History of intracranial hemorrhage.     a.     Right eye blindness and short-term memory loss. 10.Square cell carcinoma of skin.     a.     Status post left partial third digit amputation. 11.GERD. 12.Hypothyroidism. 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 14.Anemia of chronic disease. 15.Hyperparathyroidism.  ALLERGIES: 1. AVELOX causes rash. 2. CIPRO causes rash. 3. DILAUDID causes respiratory distress. 4. PROTAMINE causes hypotension. 5. ROCEPHIN causes abdominal pain.  CURRENT MEDICATIONS: 1. Azathioprine 75 mg p.o. daily. 2. Crestor 5 mg p.o. daily. 3. Dilantin 100 mg p.o. t.i.d.     a.     He is on this for prophylaxis for his intracranial      hemorrhage. 4. Fosrenol 1000 mg p.o. t.i.d. 5. Salmon oil 1000 mg p.o. daily. 6. Synthroid 200 mcg p.o. daily. 7.  Zyrtec 10 mg p.o. daily. 8. Imodium 2 mg p.o. b.i.d. 9. Losartan 25 mg p.o. daily. 10.Lumigan 0.03% 1 drop each eye at bedtime. 11.Lutein 20 mg p.o. daily. 12.Nephro-Vite daily. 13.Nizatidine 150 mg p.o. at bedtime. 14.Prednisone 5 mg p.o. daily. 15.Prograf 2 mg p.o. b.i.d. 16.Vitamin C 500 mg p.o. b.i.d.  FAMILY HISTORY:  Noncontributory.  SOCIAL HISTORY:  He has never smoked.  He quit EtOH in 1987 and lives in [place] with his wife.  REVIEW OF SYSTEMS:  GENERAL:  Positive for chills and heaves after he had his pneumonia vaccine yesterday, but this has resolved.  EYES:  Positive for glaucoma in right eye, blindness secondary to intracranial hemorrhage. EAR, NOSE, AND THROAT:  Positive for seasonal allergies.  Positive for sore throat Sunday, but this has resolved.  HEART:  He states he has a history of V-tach, VFib, AFib, and history of permanent pacemaker was AICD and denies MI.  LUNGS:  Remote pneumonia.  Denies hemoptysis or shortness of breath.  ABDOMEN:  Negative for peptic ulcer disease or melena, but  it is positive for GERD.  ENDOCRINE:  Positive for neuropathy as well as positive for hypothyroidism and diabetes. MUSCULOSKELETAL:  Positive for arthritis in bilateral hands. GENITOURINARY:  He is oliguric.  Denies any dysuria or hematuria. NEURO:  History of intracranial bleed that resulted in short-term memory loss and right eye blindness.  EXTREMITIES:  All five toes on the left foot have been amputated.  He states he has pain in the right ball of his foot, second right toe was black, and there is redness on the top of his foot.  PHYSICAL EXAMINATION:  VITAL SIGNS:  Blood pressure is 130/65, heart rate 74 and regular, respirations 20, T-max is 99.9, but now afebrile. GENERAL:  He is in no acute distress. NECK:  No carotid bruits are heard. HEENT:  Normocephalic.  Pupils are equal, round, and reactive to light. NEURO:  There are no focal defects. LUNGS:  Clear to auscultation bilaterally. HEART:  Regular rate and rhythm with positive murmur. ABDOMEN:  Soft, nontender, nondistended with positive bowel sounds. EXTREMITIES:  His right second toe is necrotic, black.  There is erythema to the dorsum of the left foot.  The right foot is with five toe amputations well healed.  There is no edema.  There is a positive Doppler signal in the dorsalis pedal and posterior tibialis bilaterally.  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 Clearly, this is a limb threatening situation. I have recommended an arteriogram to further assess his tibial artery occlusive disease. If he has adequate circulation we could proceed with  toe amputation. If the circulation is not adequate this will allow [place] to evaluate his options for revascularization.   He is scheduled for an arterial duplex scan and I agree with continuing his IV antibiotics for now.     Newton Pigg,  PA   ______________________________ Di Kindle. Edilia Bo, M.D.    SE/MEDQ  D:  08/14/2010  T:  08/15/2010  Job:  [id number]  Electronically Signed by Newton Pigg PA on 08/15/2010 08:12:59 AM Electronically Signed by Waverly Ferrari M.D. on 08/15/2010 12:56:40 PM

## 2010-08-16 ENCOUNTER — Other Ambulatory Visit: Payer: Self-pay | Admitting: Vascular Surgery

## 2010-08-16 DIAGNOSIS — I70269 Atherosclerosis of native arteries of extremities with gangrene, unspecified extremity: Secondary | ICD-10-CM

## 2010-08-16 LAB — BASIC METABOLIC PANEL
BUN: 22 mg/dL (ref 6–23)
Calcium: 9.3 mg/dL (ref 8.4–10.5)
Chloride: 98 mEq/L (ref 96–112)
Creatinine, Ser: 6.37 mg/dL — ABNORMAL HIGH (ref 0.4–1.5)
GFR calc Af Amer: 11 mL/min — ABNORMAL LOW (ref >60)

## 2010-08-16 LAB — CBC
HCT: 35.1 % — ABNORMAL LOW (ref 39.0–52.0)
MCH: 31.6 pg (ref 26.0–34.0)
MCV: 96.4 fL (ref 78.0–100.0)
RDW: 15.6 % — ABNORMAL HIGH (ref 11.5–15.5)
WBC: 5.5 10*3/uL (ref 4.0–10.5)

## 2010-08-17 ENCOUNTER — Inpatient Hospital Stay (HOSPITAL_COMMUNITY): Payer: Medicare Other

## 2010-08-17 DIAGNOSIS — L089 Local infection of the skin and subcutaneous tissue, unspecified: Secondary | ICD-10-CM

## 2010-08-17 LAB — CBC
MCH: 31.4 pg (ref 26.0–34.0)
MCV: 94.1 fL (ref 78.0–100.0)
Platelets: 221 10*3/uL (ref 150–400)
RDW: 15.3 % (ref 11.5–15.5)
WBC: 6.1 10*3/uL (ref 4.0–10.5)

## 2010-08-17 LAB — DIFFERENTIAL
Basophils Relative: 1 % (ref 0–1)
Eosinophils Absolute: 0.2 10*3/uL (ref 0.0–0.7)
Eosinophils Relative: 4 % (ref 0–5)
Lymphs Abs: 1.1 10*3/uL (ref 0.7–4.0)
Monocytes Relative: 10 % (ref 3–12)

## 2010-08-17 LAB — RENAL FUNCTION PANEL
Albumin: 2.6 g/dL — ABNORMAL LOW (ref 3.5–5.2)
Calcium: 9 mg/dL (ref 8.4–10.5)
Creatinine, Ser: 8.81 mg/dL — ABNORMAL HIGH (ref 0.50–1.35)
GFR calc non Af Amer: 6 mL/min — ABNORMAL LOW (ref >60)
Phosphorus: 4.6 mg/dL (ref 2.3–4.6)

## 2010-08-18 LAB — CULTURE, BLOOD (ROUTINE X 2): Culture  Setup Time: 201206102355

## 2010-08-21 ENCOUNTER — Encounter (INDEPENDENT_AMBULATORY_CARE_PROVIDER_SITE_OTHER): Payer: Self-pay | Admitting: General Surgery

## 2010-08-21 DIAGNOSIS — E119 Type 2 diabetes mellitus without complications: Secondary | ICD-10-CM | POA: Insufficient documentation

## 2010-08-21 DIAGNOSIS — N289 Disorder of kidney and ureter, unspecified: Secondary | ICD-10-CM | POA: Insufficient documentation

## 2010-08-21 DIAGNOSIS — R39198 Other difficulties with micturition: Secondary | ICD-10-CM | POA: Insufficient documentation

## 2010-08-21 DIAGNOSIS — Z950 Presence of cardiac pacemaker: Secondary | ICD-10-CM | POA: Insufficient documentation

## 2010-08-21 DIAGNOSIS — H409 Unspecified glaucoma: Secondary | ICD-10-CM | POA: Insufficient documentation

## 2010-08-21 DIAGNOSIS — Z22322 Carrier or suspected carrier of Methicillin resistant Staphylococcus aureus: Secondary | ICD-10-CM

## 2010-08-21 DIAGNOSIS — Z973 Presence of spectacles and contact lenses: Secondary | ICD-10-CM | POA: Insufficient documentation

## 2010-08-21 NOTE — Op Note (Signed)
  [name], KRIST NO.:  [id number]  MEDICAL RECORD NO.:  [id number]  LOCATION:  6714                         FACILITY:  [facility]  PHYSICIAN:  Larina Earthly, M.D.    DATE OF BIRTH:  09/17/1960  DATE OF PROCEDURE:  08/16/2010 DATE OF DISCHARGE:                              OPERATIVE REPORT   PREOPERATIVE DIAGNOSIS:  Dry gangrene, right second toe.  POSTOPERATIVE DIAGNOSIS:  Dry gangrene, right second toe.  PROCEDURE:  Amputation of right second toe.  SURGEON:  Larina Earthly, MD  ASSISTANT:  Nurse.  ANESTHESIA:  LMA.  COMPLICATIONS:  None.  DISPOSITION:  To recovery room and stable.  PROCEDURE IN DETAIL:  The patient was taken to the operating room and placed in supine position where the area of the right foot was prepped and draped in the usual sterile fashion.  [place] incision was made at the base of the second toe, carried down to the level of bone sharply with a 15-blade.  The bone was resected with a rongeur and this was extended up to the level of metatarsal head.  Wound was irrigated with saline.  There was minimal bleeding.  There was no purulence noted. Wounds were closed with 3-0 nylon sutures in a mattress fashion. Sterile dressing was applied and the patient was taken to the recovery room in stable condition.     Larina Earthly, M.D.     TFE/MEDQ  D:  08/16/2010  T:  08/17/2010  Job:  [id number]  Electronically Signed by TODD EARLY M.D. on 08/21/2010 01:12:10 PM

## 2010-08-22 ENCOUNTER — Ambulatory Visit (INDEPENDENT_AMBULATORY_CARE_PROVIDER_SITE_OTHER): Payer: Medicare Other | Admitting: Vascular Surgery

## 2010-08-22 DIAGNOSIS — I70269 Atherosclerosis of native arteries of extremities with gangrene, unspecified extremity: Secondary | ICD-10-CM

## 2010-08-22 NOTE — Assessment & Plan Note (Signed)
OFFICE VISIT  [name], Price DOB:  04-09-1960                                       08/22/2010 EAVWU#:[id number]  I saw patient in the office today for follow-up of his amputation of his right second toe.  This is a pleasant 51 year old gentleman with chronic kidney disease and diabetes who I saw in consultation in the hospital with dry gangrene of his right second toe.  He has a thigh graft on the left side.  He underwent an arteriogram which was performed by Dr. Myra Gianotti, which demonstrated patent femoral and popliteal system with two- vessel runoff via the anterior tibial and peroneal arteries.  There was a short focal stenosis of the anterior tibial artery.  The anterior tibial artery is occluded at the level of the ankle, and there was no runoff onto the foot.  The posterior tibial artery was occluded. Subsequently, he had a right second toe amputation by Dr. Arbie Cookey on 08/16/10.  He comes in to have his wound evaluated.  He states he is not having significant pain associated with the toe amputation site, and it has not been draining.  On physical examination, this is a pleasant 50 year old gentleman who appears his stated age.  Blood pressure is 145/67, heart rate is 80.  He has a palpable femoral pulse on the right.  The right foot appears adequately perfused.  There is a wound on the lateral aspect of the foot over the metatarsal, which is stable.  The amputation site has some skin necrosis; however, beneath this, it looks like there is the beginning of some granulation tissue.  I think it is too early to take out his sutures and that this could certainly result in an open wound.  I have instructed him to soak the foot daily in lukewarm Dial soap soaks and then apply bacitracin to soften up the eschar, which I can then try to debride in the office when I see him on July 11.  I have explained that I do not think that addressing the short focal  anterior tibial artery will have a big impact on the circulation to his forefoot, given that there is no good runoff onto the foot, but that this option could be considered if the wound shows evidence of deterioration.  I have explained that at this point I would not rush into this, given that there is some small risk of making the situation worse.  He is scheduled to see me on July 11.  I have explained that if he feels that the toe is worsening, that he could try get an appointment to see Dr. Myra Gianotti on a Monday, who could consider potentially addressing the short focal anterior tibial artery stenosis via an antegrade approach to the lesion on the right.  Has a thigh graft on the left.  Currently it does not appear that he is on antibiotics.  I think it would be best to start him on Keflex.    Di Kindle. Edilia Bo, M.D. Electronically Signed  CSD/MEDQ  D:  08/22/2010  T:  08/22/2010  Job:  [id number]  cc:   [name], MD

## 2010-08-30 ENCOUNTER — Ambulatory Visit: Payer: Medicare Other

## 2010-09-02 NOTE — Consult Note (Signed)
NAMEECHO, ALLSBROOK NO.:  [id number]  MEDICAL RECORD NO.:  [id number]  LOCATION:  6714                         FACILITY:  [facility]  PHYSICIAN:  Almond Lint, MD       DATE OF BIRTH:  [birth year]  DATE OF CONSULTATION:  08/12/2010 DATE OF DISCHARGE:                                CONSULTATION   REFERRING PHYSICIAN:  Dr. Fonnie Jarvis.  CHIEF COMPLAINT:  Right second toe gangrene.  HISTORY OF PRESENT ILLNESS:  Mr. Aman is a 50 year old diabetic male who is status post removal of his right second toe now by Dr. Zachery Dakins in our clinic.  This was done 2 weeks ago.  He developed blackened toe and was supposed to see Dr. Zachery Dakins on Friday, however, something happened, and was subsequently appointment had to be rescheduled.  He is here for continued pain of his right second toe and erythema of his foot.  He denies fevers and chills.  PAST MEDICAL HISTORY:  Significant for hypertension, diabetes, end-stage renal disease.  He has had 2 kidney transplants, those have failed, but had pancreas transplant that is working.  He has history of stroke and a pacer.  PAST SURGICAL HISTORY:  Kidney transplant, pacemaker, pancreas transplant, upper extremity fistulas x3, and dialysis graft in the lower extremity.  FAMILY HISTORY:  Noncontributory.  SOCIAL HISTORY:  He does not smoke, drink, or use alcohol.  He lives with his wife, she is accompanying him today.  REVIEW OF SYSTEMS:  Otherwise, negative x11.  MEDICATIONS:  Azathioprine, Crestor, Dilantin, Fosrenol, salmon oil, Synthroid, Zyrtec, Imodium, Losartan, Lumigan, Lutein, Nephro-Vite, nizatidine, prednisone, ProAmatine, and Prograf.  ALLERGIES:  AVELOX, DILAUDID, PROTAMINE, CIPRO, and ROCEPHIN.  PHYSICAL EXAMINATION:  VITAL SIGNS:  Temperature 98.3, heart rate 79, blood pressure 131/67, respiratory rate 16, sats 96% on room air. GENERAL:  Alert and oriented x3, in no acute distress.  He is quite disgruntled about  the situation. NECK:  Supple.  No lymphadenopathy.  No thyromegaly.  Trachea is midline. HEART:  Regular. ABDOMEN:  Soft, nontender, nondistended. EXTREMITIES:  His right second toe tip is slightly black, the other toe tip on the right side did not have any ischemic areas on them.  The bottom is more purple and the anterior aspect is more black.  He does have some erythema, going up around the third on the foot.  He has had left toe amputations. NEUROLOGIC:  No gross motor or sensory deficits, but he has slightly decreased sensation in his lower extremities.  LABS:  White count 6.7, hematocrit is 35.8, platelet count 197,000. Sodium 138, potassium 4.9, chloride 101, CO2 32, BUN 68, creatinine 9.4, glucose 97.  Right foot film shows no evidence of osteomyelitis.  ASSESSMENT AND PLAN:  Mr. Tugwell is a 50 year old male with cellulitis and gangrene of the right second toe tip.  I will notify Dr. Zachery Dakins of Mr. Steuber presence in the hospital.  I recommended IV antibiotics. I also recommended an arterial duplex of the bilateral lower extremities to evaluate for peripheral vascular disease.  If this is an embolic event to the toes, then he may also need other studies.  This also may represent microvascular disease which  is less likely.  He does not have any evidence of osteomyelitis, so I do not think an amputation will be required at this time, but I do recommend involving Vascular Surgery in his care.     Almond Lint, MD     FB/MEDQ  D:  08/12/2010  T:  08/13/2010  Job:  [id number]  Electronically Signed by Almond Lint MD on 09/02/2010 12:44:23 PM

## 2010-09-06 ENCOUNTER — Ambulatory Visit (INDEPENDENT_AMBULATORY_CARE_PROVIDER_SITE_OTHER): Payer: Medicare Other

## 2010-09-06 DIAGNOSIS — I7092 Chronic total occlusion of artery of the extremities: Secondary | ICD-10-CM

## 2010-09-06 NOTE — Assessment & Plan Note (Signed)
OFFICE VISIT  [name], [name] DOB:  1960/10/15                                       09/06/2010 EAVWU#:[id number]  DATE OF SURGERY:  August 18, 2010.  The patient is status post amputation of the right second toe on August 16, 2010 by Dr. Arbie Cookey.  The patient presents today with concerns of a blister that came up on the medial aspect of his foot, and this ruptured several days ago.  His right second toe amputation site is still with eschar as well as sutures still in place.  He also has a wound on the lateral aspect of his right foot over the metatarsal that has been there for some time and is stable.  PHYSICAL EXAMINATION:  Today his blood pressure is 137/69 in the left arm.  Heart rate is 84, and he is 99% on room air.  His right second toe amputation site is still with eschar and sutures in place.  The patient expresses concerns over where he had a blister that ruptured several days ago.  I did have Dr. Darrick Penna come in and evaluate this patient.  He reviewed his arteriogram that was performed on August 15, 2010.  Dr. Darrick Penna expressed to the patient that his wound could go either way as far as healing or not.  He also expressed to the patient that the benefits of fixing the short focal anterior tibial artery would not have a big impact on the circulation into his forefoot.  He explained to him that if these wounds do not heal, that his next option would be more than likely be a below-the-knee amputation.  Dr. Darrick Penna advised the patient to continue wound care with his daily soaks and lukewarm Dial Soap soaks and application of bacitracin to soften up the eschar.  He did not feel that he needs anymore antibiotics at this time.  The patient is scheduled to follow-up with Dr. Edilia Bo on September 12, 2010.  [name] Pigg, PA  Di Kindle. Edilia Bo, [name]. Electronically Signed  SE/MEDQ  [name]:  09/06/2010  T:  09/06/2010  Job:  [id number]

## 2010-09-11 NOTE — Discharge Summary (Signed)
[name], [name]:  [id number]  MEDICAL RECORD NO.:  [id number]  LOCATION:  6714                         FACILITY:  [facility]  PHYSICIAN:  Ileana Roup, M.D.  DATE OF BIRTH:  Jun 26, 1960  DATE OF ADMISSION:  08/12/2010 DATE OF DISCHARGE:  08/17/2010                              DISCHARGE SUMMARY   DISCHARGE DIAGNOSES: 1. 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 Gretta Began. 2. End-stage renal disease, on hemodialysis - Monday, Wednesday,     Friday schedule at [facility].  Continued per regular     schedule during hospital course.  Without evidence of uremia. 3. Hypothyroidism - TSH 1.09. 4. Diabetes mellitus type 1, status post pancreatic transplant in 2002     - hemoglobin A1c 5. 5. Hypertension - stable.  Initially continued on home losartan and     Midrin prior to dialysis sessions.  Losartan held on discharge,     will require close renal followup for consideration for resumption     of this medication for cardiac protection. 6. Status post renal and pancreatic transplant - continued on     immunosuppressive therapy. 7. Hyperlipidemia - continued on statin. 8. Anemia of chronic disease - baseline hemoglobin of 11-13 in the     setting of end-stage renal disease. 9. 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  DISPOSITION AND FOLLOWUP:  The patient is to follow up with Dr. Edilia Bo of the vascular and pain specialist in 2 weeks, he will be called with an appointment.  During the hospital followup visit, the patient's right toe wound will be reassessed and further management as indicated.  The patient also recommended to follow up with his primary care physician, Dr. Cliffton Asters in 1-2 weeks for hospital followup visit with consideration of resumption of his losartan if tolerated by his blood  pressures. Losartan was held at the time of discharge secondary to mildly low blood pressures following dialysis session.  The patient also to follow up at [facility] on Monday on August 20, 2010 at his regular dialysis session, at that time consideration for resuming losartan also be considered as cardiac measure.  The patient's CBC and renal function panel to be assessed at that time to evaluate stability of his chronic anemia and evaluation of his electrolytes.  The patient is recommended following the wound care instructions per recommendation of Dr. Adele Dan office nurse: 1. Keep current dressing intact through August 19, 2010 unless it became     saturated with discharge her blood at which time it should be     removed. 2. Do not immerse and/or saturated with water. 3. Provide easy daily cleansing of the affected area with small     amounts of water and toilet soap and tap the site to dry. 4. After cleansing, apply 2-folded 2x2 and tight here with medical     tape. 5. Continued process until Vascular Surgery followup in 2 weeks.  The     patient is also recalled  Dr. Annette Stable office on Monday, June 18     to determine if Dr. Zachery Dakins would request followup visit in the     setting of his acute condition.  DISCHARGE MEDICATIONS: 1. Acetaminophen 325 mg tabs by mouth every 6 hours as needed. 2. Aranesp IV during Monday hemodialysis sessions. 3. Percocet 5/325 mg tabs 1 tablet by mouth q.6 h. p.r.n. for pain (do     not take with alcohol, do not drive or operate heavy machinery if     you have drowsiness at this medication, do not take more than as     prescribed). 4. Zemplar 6 mcg intravenously during Monday, Wednesday, Friday     dialysis sessions. 5. Phenergan 12.5 mg tabs every 6 hours as needed for nausea. 6. Azathioprine 50 mg tabs 1-1/2 tablets by mouth every morning. 7. Crestor 5 mg tabs by mouth every other day. 8. Dilantin 100 mg by mouth every 8  hours. 9. Equalactin over-the-counter 2 tablets by mouth twice daily. 10.Resorcinol 1 tablet by mouth 3 times daily with meals. 11.Loperamide 2 mg caps by mouth twice daily. 12.Lumigan 0.03% ophthalmic solution 1 drop in both eyes daily at     bedtime. 13.Lutein 20 mg capsule by mouth every morning. 14.Nephro-Vite 1 tablet by mouth every morning. 15.Nizatidine 150 mg 1 capsule by mouth daily at bedtime. 16.Prednisone 5 mg by mouth every morning. 17.Midodrine 5 mg by mouth before dialysis sessions and his blood     pressure is less than 100 on nondialysis days. 18.Prograf 1 mg 2 capsules twice daily. 19.Salmon Oil 1000 mg 1 capsule by mouth twice daily. 20.Synthroid 200 mcg 1 tablet by mouth once every morning. 21.Vitamin C 500 mg tabs by mouth twice daily. 22.Zyrtec 10 mg by mouth every morning as needed.  PROCEDURES PERFORMED: 1. Arterial Dopplers - August 14, 2010 - ABI on the right 1.41.  Left     ABI 2.43.  Velocities elevated possibly due to calcified vessels     bilaterally.  Arterial duplex imaging of the right lower extremity     demonstrates mild-to-moderate heterogenous block throughout with     months basically forms and posterior tibial vein.  Possible to do     vessel disease present in the right lower extremity. 2. Amputation of the right second toe in the setting of a dry gangrene     - August 16, 2010 - by Dr. Gretta Began. 3. Right lower extremity arteriogram that - August 16, 2011 2012 - no     aortoiliac disease.  Pain and right superficial femoral popliteal     artery.  A focal high-grade stenosis within the right anterior     tibial artery.  Two-vessel runoff to the left anterior tibial and     peroneal with diffuse disease onto the foot. 4. Right foot x-ray - August 12, 2010 - no evidence of bony abnormality.  CONSULTATIONS: 1. [facility], Dr. Donell Beers. 2. Vascular and pain specialist, Dr. Edilia Bo.  BRIEF ADMITTING HISTORY AND PHYSICAL:  The patient is a  50 year old male with past medical history of diabetes mellitus type 1, status post pancreatic transplant, ESRD on hemodialysis, Monday, Wednesday, and Friday, severe peripheral neuropathy who presented to [facility] for evaluation of right second toe discoloration x1 day.  The patient noted that he had right second toenail infection a few months ago for which time toenail was removed by Dr. Zachery Dakins.  Two days prior to admission, the patient noted appropriate discoloration of his toe.  The patient saw Dr. Annette Stable office and was started on Keflex therapy.  However, discoloration was still continued to progress and his toe turn black in color with decreased sensation on 1 day prior to admission.  The patient otherwise denies fevers, chills, total auricle lesions, discharge from infected toe.  Denies recent trauma or falls.  PHYSICAL EXAMINATION:  VITAL SIGNS:  On admission, temperature 98.3, pulse 77, blood pressure 127/60, respirations of 18, and O2 saturation 93% on room air. GENERAL: Well developed, well nourished, in no acute distress.  Alert and oriented and appropriate throughout exam. HEAD:  Normocephalic and atraumatic. EYES:  PERRL, EOMI.  No signs of anemia or jaundice. NOSE:  Mucous membranes moist.  No noninflamed, nonerythematous. THROAT:  Oropharynx nonerythematous.  No exudates appreciated. NECK:  No deformities, masses, or tenderness noted.  Supple.  No carotid bruits.  No JVD. LUNGS:  Normal respiratory effort.  Clear to auscultation bilaterally without crackles or wheezes. HEART:  Regular rate and rhythm, S1 and S2 normal without gallops or rubs.  Pulsatile with murmurs. ABDOMEN:  Bowel sounds normoactive, soft, nondistended, nontender.  No masses or organomegaly. EXTREMITIES:  No pretibial edema.  Right necrotic second toe cool to touch.  Reduced sensation in bilateral feet.  Pain in DP/PT pulses bilaterally. NEUROLOGIC:  Alert and oriented x3.  Cranial nerves  II through XII grossly intact.  Motor strength 5/5 in all 4 extremities.  LABS ON ADMISSION: 1. CBC:  WBC 7.1, hemoglobin 12, hematocrit 34.5, and platelets 193. 2. Renal function panel:  Sodium 139, potassium 4.9, chloride 97,     bicarb 28, glucose 90, BUN 69, creatinine 9.06, albumin 2.9,     calcium 9.5, phosphorus 0.5. 3. Coagulation studies:  PTT 37.  PT 14.7, INR 1.13.  HOSPITAL COURSE: 1. Dry gangrene of the right second toe.  The patient has a history of     severe peripheral vascular disease and prior distal amputation.     There was no evidence of osteomyelitis of his right foot x-ray on     admission, however, the patient was empirically started on     vancomycin and Zosyn on admission.  General surgery was consulted     and recommended vascular surgery input in the setting of severe     peripheral vascular disease.  Bilateral lower extremity arterial     Dopplers were performed.  However, likely inaccurate in the setting     of calcified vessels bilaterally.  Therefore ,arteriogram was     performed on August 15, 2010, showing focal high-grade stenosis     within the right anterior tibial artery.  Two-vessel runoff to     anterior tibial and peroneal with diffuse disease onto the foot.     Secondary to these findings Vascular Surgery proceed with     amputation of the right second toe on August 16, 2010.  There was     minimal blood loss during the procedure and the patient tolerated     the procedure well without known complications.  During the     surgical intervention, there was no evidence of infectious     etiology, therefore, Vascular Surgery did not recommend     continuation of antibiotics beyond discharge. 2. ESRD, on hemodialysis - Monday, Wednesday, Friday hemodialysis     schedule [facility].  There was no evidence of uremia     during the hospital course.  Renal Service was consulted to     maintain the patient's  regular hemodialysis schedule and  the     patient received multiple sessions to maintain his schedule. 3. Hypothyroidism - TSH 1.09, appropriate.  Continued on home     medications. 4. Hypertension - stable.  The patient was discontinued of his     losartan during the hospital course by Renal Service.  Blood     pressures during hemodialysis and was continued on midodrine prior     to dialysis sessions.  Despite minimal decline in blood pressures,     the patient remained asymptomatic and without lightheadedness,     dizziness, orthostasis.  The patient will be followed up on Monday     at [facility] at which time they can consider     resumption of losartan as tolerated by blood pressures. 5. Diabetes mellitus type 1, status post pancreatic transplant -     hemoglobin A1c 5.  No longer requiring insulin therapy. 6. Status post renal and pancreatic transplant - [facility] transplant     records were reviewed and confirmed ongoing immunosuppressive     treatment with Prograf, prednisone, and azathioprine.  As such, the     patient was continued on these medications during the hospital     course. 7. Coronary artery disease, nonobstructive.  The patient continued on     a statin therapy during the hospital course. 8. Hyperlipidemia - continued home medications. 9. Anemia of chronic, his baseline hemoglobin 11-13.  Stable during     hospital course.  Continued on Aranesp and Zemplar with dialysis     sessions.  VITAL SIGNS ON DISCHARGE:  Temperature 97.9, pulse 60, respirations 20, and blood pressure 95/54.  LABS ON DISCHARGE: 1. BMET: Sodium 139, potassium 3.4, chloride 99, bicarb 25, glucose 95, BUN     38, creatinine 8.81, albumin 2.6, and calcium 9, and phosphorous     4.6. 2. CBC:  WBC 6.1, hemoglobin 11.2, hematocrit 33.6, platelets 221.   Prior to discharge, and after dictation, patient experienced some transient lightheadedness, therefore, orthostatic vital signs were ordered and found to be negative.  Patient was provided a 100cc NS bolus, after which vital signs were rechecked, with  systolic blood pressures in the 110s mmHg. By the time of discharge, the patient is feeling well, is asymptomatic without chest pain, difficulty breathing, shortness of breath, lightheadedness, dizziness, headaches.  The patient notes significant  improvement of his foot pain.  The patient is to have close followup with Vascular Surgery, primary care physician, and Nephrology.    ______________________________ Johnette Abraham, DO   ______________________________ Ileana Roup, M.D.    SK/MEDQ  D:  08/17/2010  T:  08/17/2010  Job:  [id number]  cc:   Di Kindle. Edilia Bo, M.D. Anselm Pancoast. Zachery Dakins, M.D. Modesto Charon Doylene Canning. Ladona Ridgel, MD  Electronically Signed by Johnette Abraham DO on 08/26/2010 08:13:15 AM Electronically Signed by Margarito Liner M.D. on 09/11/2010 09:12:38 AM

## 2010-09-12 ENCOUNTER — Ambulatory Visit (INDEPENDENT_AMBULATORY_CARE_PROVIDER_SITE_OTHER): Payer: Medicare Other | Admitting: Vascular Surgery

## 2010-09-12 DIAGNOSIS — I739 Peripheral vascular disease, unspecified: Secondary | ICD-10-CM

## 2010-09-12 NOTE — Assessment & Plan Note (Signed)
OFFICE VISIT  BAYRON, DALTO DOB:  08/23/1960                                       09/12/2010 ZOXWR#:[id number]  I saw patient in the office today to continue to follow his right second toe amputation, which was performed on 08/16/10.  This is a patient who I had seen in consultation in the hospital with dry gangrene of his right second toe on 08/22/10.  He has a thigh graft on the left side. He had an arteriogram which showed patent femoral and popliteal system on the right with two-vessel runoff via the anterior tibial and peroneal arteries.  There was a short focal stenosis in the anterior tibial artery, but I did not think addressing this would significantly impact the circulation of the forefoot and would also be associated with some risk of making the situation worse.  The patient states he has had no fever and no significant drainage from the wound.  He has been soaking the foot daily and applying bacitracin and a dressing.  PHYSICAL EXAMINATION:  Temperature 97.9, blood pressure 146/76, heart rate is 80.  The wound had necrotic tissue which I debrided in the office today and removed his sutures.  There was some bleeding.  I have instructed him to pack this twice daily with wet-to-dry dressings.  Currently, there is no evidence of infection.  I will plan on seeing him back in 3 weeks.  Hopefully we can get this to gradually heal, if not, he could potentially require below-the-knee amputation.    Di Kindle. Edilia Bo, M.D. Electronically Signed  CSD/MEDQ  D:  09/12/2010  T:  09/12/2010  Job:  [id number]

## 2010-09-13 ENCOUNTER — Ambulatory Visit (INDEPENDENT_AMBULATORY_CARE_PROVIDER_SITE_OTHER): Payer: Medicare Other | Admitting: General Surgery

## 2010-09-13 ENCOUNTER — Encounter (INDEPENDENT_AMBULATORY_CARE_PROVIDER_SITE_OTHER): Payer: Self-pay | Admitting: General Surgery

## 2010-09-13 VITALS — Temp 96.8°F | Ht 71.0 in | Wt 142.4 lb

## 2010-09-13 DIAGNOSIS — IMO0001 Reserved for inherently not codable concepts without codable children: Secondary | ICD-10-CM

## 2010-09-13 DIAGNOSIS — S98139A Complete traumatic amputation of one unspecified lesser toe, initial encounter: Secondary | ICD-10-CM

## 2010-09-13 DIAGNOSIS — E1059 Type 1 diabetes mellitus with other circulatory complications: Secondary | ICD-10-CM

## 2010-09-13 DIAGNOSIS — E1051 Type 1 diabetes mellitus with diabetic peripheral angiopathy without gangrene: Secondary | ICD-10-CM

## 2010-09-13 DIAGNOSIS — I739 Peripheral vascular disease, unspecified: Secondary | ICD-10-CM

## 2010-09-13 NOTE — Progress Notes (Signed)
Subjective:     Patient ID: Wayna Chalet, male   DOB: Dec 23, 1960, 50 y.o.   MRN: [id number]    Temp(Src) 96.8 F (36 C) (Temporal)  Ht 5\' 11"  (1.803 m)  Wt 142 lb 6.4 oz (64.592 kg)  BMI 19.86 kg/m2    HPI  The patient is concernedMr. Renaldo Fiddler is a patient Luretha Rued for many years he is a dialysis patient secondary to diabetes mellitus and has had problems with peripheral vascular disease at L3 resulted in the loss of all 5 toes left foot recently was hospitalized at [facility] with an ischemic right second toe and this has been a previous problem with a little ulcer on the PIP joint of that toe he was admitted and evaluated with arteriograms and extensive vascular evaluation by the vascular surgeons and the medical service and underwent a simple amputation of the right second toe the area is healing slowly and he does have some question of ischemic areas on the undersurface and hopefully this will heal and is going to require a significant length of time as the previous amputations of left foot I'm in agreement with wet to dry dressings on a b.i.d. basis I would add a few drops of Betadine on the wound but no other physicians feel that is not needed   Review of Systems     Objective:   Physical Exam     Assessment:        Plan:    I agree with the patient's management of wet to dry dressing b.i.d. basis and I think that he is being followed nicely by Dr. Durwin Nora and Dr. early and I am happy to see the patient on a p.r.n. basis but I think it is better to let the operating surgeon manage him and not knee following unless the patient definitely prefers otherwise I have not given him a followup test was scheduled appointment we'll plan on seeing him if he desires and he is aware that I am planning to work until the end of this year

## 2010-09-13 NOTE — Patient Instructions (Signed)
Please continue the wet to dry dressing changes we'll 2 times a day basis as instructed by Dr. early and Dr. Durwin Nora your left foot appears to be doing nicely and no dressing as needed. I think a few drops of Betadine and applied to the open incision would do no harm and possibly will help him happy to see you again if needed suture being followed by Dr. Janace Litten and Dr. 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

## 2010-09-16 NOTE — Op Note (Signed)
  [name], [name]:  [id number]  MEDICAL RECORD NO.:  [id number]  LOCATION:  6714                         FACILITY:  [facility]  PHYSICIAN:  Juleen China IV, MDDATE OF BIRTH:  05/28/1960  DATE OF PROCEDURE:  08/15/2010 DATE OF DISCHARGE:                              OPERATIVE REPORT   SURGEON: 1. Durene Cal IV, MD  PREOPERATIVE DIAGNOSIS:  Right leg ulcer.  POSTOPERATIVE DIAGNOSIS:  Right leg ulcer.  PROCEDURES PERFORMED: 1. Ultrasound accessed right femoral artery. 2. Abdominal aortogram. 3. Right leg runoff. 4. Catheter and aorta x1.  INDICATIONS:  This is a 50 year old gentleman with end-stage renal disease with a new right foot wound, he comes in today for arteriogram.  PROCEDURE:  The patient was identified in the holding area and taken to room #8, placed supine on the table.  The right groin was prepped and draped in usual fashion.  A time-out was called.  The right femoral artery was evaluated with ultrasound and found to be widely patent.  A digital ultrasound image was acquired.  The right femoral artery was accessed under ultrasound guidance with the micropuncture needle.  An 0.035 wire advanced into the aorta under fluoroscopic visualization.  A 5-French sheath was placed.  Over the wire, an Omniflush catheter was advanced at the level of L1.  Abdominal aortogram was obtained.  Next, retrograde sheath injections were performed to evaluate the flow in the right leg.  FINDINGS:  Aortogram:  The visualized portions of the suprarenal abdominal aorta showed no significant disease.  The infrarenal abdominal aorta was widely patent.  Bilateral common iliac and external iliac arteries are widely patent.  There is venous flow within on the left side consistent with the patient having a left-sided thigh graft.  Right lower extremity:  Right common femoral artery is widely patent. The right profunda femoral artery is widely patent.  The  right superficial femoral artery is widely patent.  The right popliteal artery is patent throughout its course.  The anterior tibial artery is patent down to the ankle.  There is a high-grade approximately 90% stenosis in its midportion.  The tibioperoneal trunk is patent.  The posterior tibial artery is occluded.  Peroneal artery is patent down to the ankle where it becomes diminutive.  Collateral vessels fill the foot.  After above images were obtained, decision was made to terminate the procedure.  Catheters and wires were removed.  The patient was taken to the holding area for sheath pull.  IMPRESSION: 1. No significant aortoiliac disease. 2. Patent right superficial femoral-popliteal artery. 3. Focal high-grade stenosis within the right anterior tibial artery. 4. Two-vessel runoff through the anterior tibial and peroneal with     diffuse disease out onto the foot.     [name], MD     VWB/MEDQ  D:  08/15/2010  T:  08/16/2010  Job:  [id number]  Electronically Signed by Arelia Longest IV MD on 09/16/2010 10:57:58 PM

## 2010-09-26 ENCOUNTER — Ambulatory Visit (INDEPENDENT_AMBULATORY_CARE_PROVIDER_SITE_OTHER): Payer: Medicare Other

## 2010-09-26 DIAGNOSIS — I739 Peripheral vascular disease, unspecified: Secondary | ICD-10-CM

## 2010-09-26 NOTE — Assessment & Plan Note (Signed)
OFFICE VISIT  [name], [name] DOB:  [birth year]                                       09/26/2010 ZOXWR#:[id number]  This patient returns today after calling, saying that when changing the dressing today, they noticed a foul odor coming from his wound from his right second toe amputation site, which was done on August 16, 2010.  At that time, the patient was brought in to be seen.  The patient denies any fevers or any drainage from his wound.  PHYSICAL EXAMINATION:  VITAL SIGNS:  Blood pressure is 138/71 in his left arm, heart rate is 81, and he is 98% on room air.  On his right foot, he does have a right wound with dry skin and eschar on the medial and lateral aspect of his right foot.  His second toe amputation site has nonhealing tissue.  Dr. Edilia Bo did come in and do a mild debridement of his lateral wound to get rid of some of the dry skin as well as debridement of his second toe amputation site.  He also opted to start the patient on Keflex 200 mg p.o. t.i.d. x10 days.  The patient will continue to do wet-to-dry dressings b.i.d.  The patient will follow up with Dr. Edilia Bo in 2 weeks to reevaluate his wound.  Newton Pigg, PA  Di Kindle. Edilia Bo, M.D. Electronically Signed  SE/MEDQ  D:  09/26/2010  T:  09/26/2010  Job:  [id number]

## 2010-09-28 NOTE — Progress Notes (Signed)
Subjective:     Patient ID: [name], male   DOB: January 21, 1961, 50 y.o.   MRN: [id number]  HPI  This patient had a right second toe amputation on 08/16/2010. He had previously undergone an arteriogram which showed that he had no options for revascularization on the right. Last week I debrided his wound in the office.  He comes in for a 1 week followup visit.  He does complain of a cough which he has had for about a week. He has had some chills. He denies any history of fever. The toe amputation site has not been draining. He states that it has an odor however.  Past Medical History  Diagnosis Date  . Secondary hyperparathyroidism, renal     In setting of ESRD.  [name] Hypothyroidism   . Automatic implantable cardiac defibrillator in situ     History of implanted dual-chamber ICD in 01/2004, with removal and insertion of a Bi-V ICD in 08/2009 by Dr. 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 Hypertension   . Nonischemic cardiomyopathy     2D-echocardiogram (08/2007) - LV EF 45%, akinesis of inferoseptal wall, inferior wall,   . ESRD on hemodialysis Started age 47yo    on MWF schedule. Previous failed renal transplant 1998 at [facility] (with rejection in 1998). 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 Resumed HD 10/2006.   [name] DM type 1 (diabetes mellitus, type 1) DX: age 60yo    previously well controlled with Aic 5 (12/2008)  . Intracranial hemorrhage     History of. On prophylactic dilantin.  . 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  . Hyperlipidemia   . Hemopericardium     History of in setting of failed radiogrequency ablation for Vtach  . Anemia of chronic disease     BL Hgb 11-13. In setting of ESRD.   [name] Peptic ulcer disease     Unknown history, no records per [employer].   . Cholelithiasis     Noted at least since  01/2006. Asymptomatic.  . Erectile dysfunction   . Coronary artery disease     nonobstructive per cardiac cath (01/2004). LAD has 20% multiple discrete lesions in med and distal portion.   . Peripheral autonomic neuropathy due to diabetes mellitus   . GERD (gastroesophageal reflux disease)   . Glaucoma   . Squamous cell skin cancer, finger     History of. Marginal resection in 05/2008 with recurrent infection/ lytic lesion involving distal phalanx  . Toe amputation status     Ischemic fourth and fifith toe left (03/2009), history of previous 1,2,3 toe  amputations.   [name] MRSA (methicillin resistant staph aureus) culture positive     Verify type - Per medical history form dated 02/13/10.  . Kidney disease     Per medical history form dated 02/13/10.  [name] Difficulty urinating     per medical history dated 02/13/10.  [name] Poor circulation     per medical history form dated 02/13/10.    Family History  Problem Relation Age of Onset  . Hypertension Brother   . Hypertension Sister   . Coronary artery disease Mother     x 5 vessel graft in her 33s  . Hypertension Mother   . Prostate cancer Father   . Hypertension Father   . Hypertension Brother     History  Substance Use Topics  . Smoking status: Former Smoker    Quit date: 03/04/1985  . Smokeless tobacco: Not on file  . Alcohol Use: No     previously drank heavily from 1980-1987    Allergies  Allergen Reactions  . Avelox (Moxifloxacin Hcl In Nacl)     Ask patient to clarify type of medication. Also need to know type/severity/reaction.  . Ceftriaxone Sodium Nausea And Vomiting  . Protamine     Significant hypotension?  . Rocephin (Ceftriaxone Sodium In Dextrose)     Ask patient to clarify type of medication. Also need to know type/severity/reaction.  . Ciprofloxacin Rash  . Moxifloxacin Rash    Current Outpatient Prescriptions  Medication Sig Dispense Refill  . azaTHIOprine (IMURAN) 50 MG tablet Take 1 and a half tablets  once daily.       . B Complex-C-Folic Acid (B COMPLEX-VITAMIN C-FOLIC ACID) 1 MG tablet Take 1 tablet by mouth daily.        . bimatoprost (LUMIGAN) 0.03 % ophthalmic drops 1 gtt once daily       . Calcium Polycarbophil (EQUALACTIN PO) Take 2 tablets by mouth 2 (two) times daily.        . cetirizine (ZYRTEC) 10 MG tablet Take 10 mg by mouth daily.        [name] lanthanum (FOSRENOL) 1000 MG chewable tablet 1 tab four times a day       . levothyroxine (SYNTHROID, LEVOTHROID) 200 MCG tablet Take 200 mcg by mouth daily.        [name] loperamide (IMODIUM A-D) 2 MG tablet Take 2 mg by mouth 2 (two) times daily.        [name] losartan (COZAAR) 25 MG tablet Take 25 mg by mouth daily.        . Lutein 20 MG TABS Take 1 tablet by mouth daily.        . midodrine (PROAMATINE) 5 MG tablet Take one by mouth before dialysis and if BP < 100       . nizatidine (AXID) 150 MG capsule Take 150 mg by mouth daily.        . Omega-3 Fatty Acids (SALMON OIL) 200 MG CAPS 100mg  once daily       . phenytoin (DILANTIN) 100 MG ER capsule Take by mouth 3 (three) times daily.        . predniSONE (DELTASONE) 5 MG tablet Take 5 mg by mouth daily.        . rosuvastatin (CRESTOR) 5 MG tablet Take one tablet by mouth every other day.       . tacrolimus (PROGRAF) 1 MG capsule 2 capsules, two times a day       . vitamin C (ASCORBIC ACID) 500 MG tablet Take 500 mg by mouth 2 (two) times daily.          Review of Systems  Constitutional: Positive for chills. Negative for fever.  Respiratory: Positive for cough. Negative for chest tightness and shortness of breath.        Objective:   Physical Exam  Pulmonary/Chest: He has rales.       Right upper lobe   The patient appears their stated age.   There were no vitals filed for this visit.  There is no height or weight on file to calculate BMI.  The right second toe amputation does not appear to be healing. There is an odor to the wound. There is no significant granulation tissue. The bone  is exposed  in the wound.  He has a full thickness wound on the medial and lateral aspect of the left foot over the metatarsal head.     Assessment:     The second toe amputation site is nonhealing. I have again reviewed his arteriogram. He has a patent superficial femoral artery with two-vessel runoff via the anterior tibial and peroneal arteries. There are no options for revascularization which would impact the circulation of his foot. I have explained that I do not think there is any value in trying to to debride the wound in the operating room. I believe that the options are either continued dressing changes with the hopes that this might gradually improve although I think this is very unlikely, versus transmetatarsal amputation with a 50-50 chance of healing, versus below-the-knee amputation. He will consider these options and let us know what he decides.    Plan:     If I do not hear back from him to schedule surgery then we will see him in 2 weeks. I have written him prescription for Keflex for 10 days. He knows to call sooner if he has problems. I have also encouraged him to followup with his primary care physician concerning his cough and possible developing pneumonia.

## 2010-10-03 ENCOUNTER — Encounter: Payer: Self-pay | Admitting: Vascular Surgery

## 2010-10-03 ENCOUNTER — Ambulatory Visit (INDEPENDENT_AMBULATORY_CARE_PROVIDER_SITE_OTHER): Payer: Medicare Other | Admitting: Vascular Surgery

## 2010-10-03 VITALS — BP 136/69 | HR 98 | Temp 99.1°F | Resp 16 | Ht 70.0 in | Wt 143.0 lb

## 2010-10-03 DIAGNOSIS — I70219 Atherosclerosis of native arteries of extremities with intermittent claudication, unspecified extremity: Secondary | ICD-10-CM

## 2010-10-03 NOTE — Progress Notes (Signed)
2 wk f/u to recheck Right 2nd toe amp site.

## 2010-10-04 ENCOUNTER — Encounter: Payer: Self-pay | Admitting: Internal Medicine

## 2010-10-04 ENCOUNTER — Ambulatory Visit (INDEPENDENT_AMBULATORY_CARE_PROVIDER_SITE_OTHER): Payer: Medicare Other | Admitting: *Deleted

## 2010-10-04 ENCOUNTER — Other Ambulatory Visit: Payer: Self-pay | Admitting: Internal Medicine

## 2010-10-04 DIAGNOSIS — Z95810 Presence of automatic (implantable) cardiac defibrillator: Secondary | ICD-10-CM

## 2010-10-04 DIAGNOSIS — Z950 Presence of cardiac pacemaker: Secondary | ICD-10-CM

## 2010-10-04 DIAGNOSIS — I428 Other cardiomyopathies: Secondary | ICD-10-CM

## 2010-10-04 LAB — REMOTE ICD DEVICE
AL AMPLITUDE: 3.4 mv
BAMS-0001: 150 {beats}/min
BRDY-0004LV: 95 {beats}/min
DEVICE MODEL ICD: 614310
LV LEAD THRESHOLD: 0.875 V
MODE SWITCH EPISODES: 32
PACEART VT: 1
RV LEAD AMPLITUDE: 11.9 mv
TZAT-0001SLOWVT: 1
TZAT-0004FASTVT: 8
TZAT-0013FASTVT: 1
TZAT-0018FASTVT: NEGATIVE
TZAT-0020SLOWVT: 1 ms
TZON-0003FASTVT: 280 ms
TZON-0005SLOWVT: 6
TZST-0001FASTVT: 2
TZST-0001FASTVT: 3
TZST-0001FASTVT: 5
TZST-0001SLOWVT: 2
TZST-0001SLOWVT: 4
TZST-0003FASTVT: 40 J
TZST-0003FASTVT: 40 J
TZST-0003FASTVT: 40 J
TZST-0003SLOWVT: 20 J
TZST-0003SLOWVT: 40 J

## 2010-10-08 ENCOUNTER — Emergency Department (HOSPITAL_COMMUNITY): Payer: Medicare Other

## 2010-10-08 ENCOUNTER — Inpatient Hospital Stay (HOSPITAL_COMMUNITY)
Admission: EM | Admit: 2010-10-08 | Discharge: 2010-10-15 | DRG: 239 | Disposition: A | Discharge status: Rehabilitation Facility | Payer: Medicare Other | Attending: Internal Medicine | Admitting: Internal Medicine

## 2010-10-08 DIAGNOSIS — S68118A Complete traumatic metacarpophalangeal amputation of other finger, initial encounter: Secondary | ICD-10-CM

## 2010-10-08 DIAGNOSIS — E876 Hypokalemia: Secondary | ICD-10-CM | POA: Diagnosis not present

## 2010-10-08 DIAGNOSIS — I96 Gangrene, not elsewhere classified: Secondary | ICD-10-CM | POA: Diagnosis present

## 2010-10-08 DIAGNOSIS — I120 Hypertensive chronic kidney disease with stage 5 chronic kidney disease or end stage renal disease: Secondary | ICD-10-CM | POA: Diagnosis present

## 2010-10-08 DIAGNOSIS — L97509 Non-pressure chronic ulcer of other part of unspecified foot with unspecified severity: Secondary | ICD-10-CM | POA: Diagnosis present

## 2010-10-08 DIAGNOSIS — Z79899 Other long term (current) drug therapy: Secondary | ICD-10-CM

## 2010-10-08 DIAGNOSIS — E039 Hypothyroidism, unspecified: Secondary | ICD-10-CM | POA: Diagnosis present

## 2010-10-08 DIAGNOSIS — S98919A Complete traumatic amputation of unspecified foot, level unspecified, initial encounter: Secondary | ICD-10-CM

## 2010-10-08 DIAGNOSIS — H409 Unspecified glaucoma: Secondary | ICD-10-CM | POA: Diagnosis present

## 2010-10-08 DIAGNOSIS — IMO0002 Reserved for concepts with insufficient information to code with codable children: Secondary | ICD-10-CM

## 2010-10-08 DIAGNOSIS — I739 Peripheral vascular disease, unspecified: Principal | ICD-10-CM | POA: Diagnosis present

## 2010-10-08 DIAGNOSIS — Z95810 Presence of automatic (implantable) cardiac defibrillator: Secondary | ICD-10-CM

## 2010-10-08 DIAGNOSIS — N186 End stage renal disease: Secondary | ICD-10-CM | POA: Diagnosis present

## 2010-10-08 DIAGNOSIS — D638 Anemia in other chronic diseases classified elsewhere: Secondary | ICD-10-CM | POA: Diagnosis present

## 2010-10-08 DIAGNOSIS — Y830 Surgical operation with transplant of whole organ as the cause of abnormal reaction of the patient, or of later complication, without mention of misadventure at the time of the procedure: Secondary | ICD-10-CM | POA: Diagnosis present

## 2010-10-08 DIAGNOSIS — Z8673 Personal history of transient ischemic attack (TIA), and cerebral infarction without residual deficits: Secondary | ICD-10-CM

## 2010-10-08 DIAGNOSIS — S98139A Complete traumatic amputation of one unspecified lesser toe, initial encounter: Secondary | ICD-10-CM

## 2010-10-08 DIAGNOSIS — Z85828 Personal history of other malignant neoplasm of skin: Secondary | ICD-10-CM

## 2010-10-08 DIAGNOSIS — E785 Hyperlipidemia, unspecified: Secondary | ICD-10-CM | POA: Diagnosis present

## 2010-10-08 DIAGNOSIS — Z9483 Pancreas transplant status: Secondary | ICD-10-CM

## 2010-10-08 DIAGNOSIS — L02619 Cutaneous abscess of unspecified foot: Secondary | ICD-10-CM | POA: Diagnosis present

## 2010-10-08 DIAGNOSIS — N2581 Secondary hyperparathyroidism of renal origin: Secondary | ICD-10-CM | POA: Diagnosis present

## 2010-10-08 DIAGNOSIS — Z992 Dependence on renal dialysis: Secondary | ICD-10-CM

## 2010-10-08 DIAGNOSIS — T8610 Unspecified complication of kidney transplant: Secondary | ICD-10-CM | POA: Diagnosis present

## 2010-10-08 DIAGNOSIS — K5900 Constipation, unspecified: Secondary | ICD-10-CM | POA: Diagnosis not present

## 2010-10-08 DIAGNOSIS — R5381 Other malaise: Secondary | ICD-10-CM | POA: Diagnosis not present

## 2010-10-08 DIAGNOSIS — M869 Osteomyelitis, unspecified: Secondary | ICD-10-CM | POA: Diagnosis present

## 2010-10-08 LAB — CBC
MCH: 29.9 pg (ref 26.0–34.0)
MCV: 90.9 fL (ref 78.0–100.0)
Platelets: 243 10*3/uL (ref 150–400)
RDW: 15.7 % — ABNORMAL HIGH (ref 11.5–15.5)

## 2010-10-08 LAB — DIFFERENTIAL
Basophils Relative: 0 % (ref 0–1)
Eosinophils Absolute: 0.1 10*3/uL (ref 0.0–0.7)
Eosinophils Relative: 1 % (ref 0–5)
Lymphs Abs: 0.5 10*3/uL — ABNORMAL LOW (ref 0.7–4.0)
Monocytes Relative: 7 % (ref 3–12)

## 2010-10-08 LAB — GLUCOSE, CAPILLARY: Glucose-Capillary: 110 mg/dL — ABNORMAL HIGH (ref 70–99)

## 2010-10-08 LAB — BASIC METABOLIC PANEL: Glucose, Bld: 87 mg/dL (ref 70–99)

## 2010-10-08 LAB — LACTIC ACID, PLASMA: Lactic Acid, Venous: 1.3 mmol/L (ref 0.5–2.2)

## 2010-10-09 DIAGNOSIS — I70269 Atherosclerosis of native arteries of extremities with gangrene, unspecified extremity: Secondary | ICD-10-CM

## 2010-10-09 LAB — CBC
Hemoglobin: 9.9 g/dL — ABNORMAL LOW (ref 13.0–17.0)
MCV: 91.8 fL (ref 78.0–100.0)
Platelets: 271 10*3/uL (ref 150–400)
Platelets: 291 10*3/uL (ref 150–400)
RBC: 3.32 MIL/uL — ABNORMAL LOW (ref 4.22–5.81)
RBC: 3.29 MIL/uL — ABNORMAL LOW (ref 4.22–5.81)
RDW: 15.8 % — ABNORMAL HIGH (ref 11.5–15.5)
WBC: 9.2 10*3/uL (ref 4.0–10.5)
WBC: 9.6 10*3/uL (ref 4.0–10.5)

## 2010-10-09 LAB — BASIC METABOLIC PANEL
CO2: 31 mEq/L (ref 19–32)
CO2: 30 mEq/L (ref 19–32)
Chloride: 96 mEq/L (ref 96–112)
GFR calc Af Amer: 11 mL/min — ABNORMAL LOW (ref >60)
Glucose, Bld: 91 mg/dL (ref 70–99)
Potassium: 3.9 mEq/L (ref 3.5–5.1)
Potassium: 3.6 mEq/L (ref 3.5–5.1)
Sodium: 138 mEq/L (ref 135–145)
Sodium: 140 mEq/L (ref 135–145)

## 2010-10-09 LAB — GLUCOSE, CAPILLARY
Glucose-Capillary: 105 mg/dL — ABNORMAL HIGH (ref 70–99)
Glucose-Capillary: 98 mg/dL (ref 70–99)
Glucose-Capillary: 93 mg/dL (ref 70–99)

## 2010-10-09 LAB — HEMOGLOBIN A1C
Hgb A1c MFr Bld: 5.2 % (ref <5.7)
Mean Plasma Glucose: 103 mg/dL (ref <117)

## 2010-10-09 LAB — TSH: TSH: 1.541 u[IU]/mL (ref 0.350–4.500)

## 2010-10-09 LAB — MRSA PCR SCREENING: MRSA by PCR: NEGATIVE

## 2010-10-09 LAB — ABO/RH: ABO/RH(D): A POS

## 2010-10-10 ENCOUNTER — Other Ambulatory Visit: Payer: Self-pay | Admitting: Surgery

## 2010-10-10 ENCOUNTER — Inpatient Hospital Stay (HOSPITAL_COMMUNITY): Payer: Medicare Other

## 2010-10-10 LAB — CBC
MCV: 92.6 fL (ref 78.0–100.0)
Platelets: 307 10*3/uL (ref 150–400)
RBC: 3.09 MIL/uL — ABNORMAL LOW (ref 4.22–5.81)
RDW: 16 % — ABNORMAL HIGH (ref 11.5–15.5)
WBC: 6.2 10*3/uL (ref 4.0–10.5)

## 2010-10-10 LAB — BASIC METABOLIC PANEL
CO2: 27 mEq/L (ref 19–32)
Chloride: 97 mEq/L (ref 96–112)
Creatinine, Ser: 7.98 mg/dL — ABNORMAL HIGH (ref 0.50–1.35)
GFR calc Af Amer: 9 mL/min — ABNORMAL LOW (ref >60)
Potassium: 4.2 mEq/L (ref 3.5–5.1)
Sodium: 140 mEq/L (ref 135–145)

## 2010-10-10 NOTE — Progress Notes (Signed)
icd remote check  

## 2010-10-10 NOTE — H&P (Signed)
NAMEFLETCHER, OSTERMILLER NO.:  [id number]  MEDICAL RECORD NO.:  [id number]  LOCATION:  [facility]                         FACILITY:  [facility]  PHYSICIAN:  Thad Ranger, MD       DATE OF BIRTH:  June 24, 1960  DATE OF ADMISSION:  10/08/2010 DATE OF DISCHARGE:                             HISTORY & PHYSICAL   PRIMARY CARE PHYSICIAN:  Dr. Modesto Charon in [place].  PRIMARY NEPHROLOGIST:  Dr. Elvis Coil.  PRIMARY VASCULAR SURGEON:  Di Kindle. Edilia Bo, MD  CHIEF COMPLAINT:  Sent from the Hemodialysis Center for fevers and worsening right foot ulcer.  HISTORY OF PRESENT ILLNESS:  Mr. Beyene is a 50 year old Caucasian male with multiple medical problems including history of end-stage renal disease on hemodialysis Monday, Wednesday, Friday, history of prior dry gangrene on the right second toe which was amputated on August 16, 2010, history of hypothyroidism, diabetes mellitus, hypertension, history of renal and pancreatic transplant on immunosuppressive therapy, history of anemia and chronic disease was at the Hemodialysis Center today when the patient was noted to have fevers with draining right foot ulcer.  The patient was sent to the [facility] Emergency Room for further evaluation and admission.  History was obtained from the patient who stated that he has been feeling somewhat unwell from the last week.  On Monday 7 days ago, he developed cough and congestion and went to see his PCP.  He was prescribed Z-Pak and albuterol inhaler for his viral bronchitis.  On Wednesday, 5 days ago days ago, the patient saw his vascular surgeon Dr. Edilia Bo.  At that time, the patient was having fevers with rigors and highest fever checked at home was 102.  The patient states he was having pain in his right foot on bearing weight.  He had foul-smelling discharge from the area between great toe and the third toe.  He recently had amputation of the second toe on August 16, 2010.  The  patient developed eschar on lateral and medial side of the foot and in between the big and the third toe.  Per the patient, Dr. Edilia Bo had advised him for a possible amputation at this time.  The patient also states that he has been on Keflex for the last 3 weeks, however, the wound is not improving.  The patient states that he has some nausea but no abdominal pain.  He has been tolerating diet so far.  He states that he had renal and pancreatic transplant and since then he has been having chronic diarrhea for the last 2 years.  He denies any chest pain or any shortness of breath, any headaches, blurry vision.  He denies any abdominal pain, any hematochezia, melena, any palpitations.  REVIEW OF SYSTEMS:  A 12 point review of system was done and negative except pertinent positives dictated in the HPI.  PAST MEDICAL HISTORY: 1. History of renal and pancreatic transplant. 2. End-stage renal disease with hemodialysis on Monday, Wednesday,     Friday.  His nephrologist is Dr. Elvis Coil. 3. Hypothyroidism. 4. History of intracranial hemorrhage for which he is on prophylactic     Dilantin. 5. Hyperlipidemia. 6. History of V tach status  post defibrillator placement. 7. History of squamous cell cancer on the fingernail of the left hand. 8. History of multiple toe and finger amputation.  He has all the toe     and amputation on the left foot. 9. History of sternotomy of the hemopericardium and failed ablation. 10.Anemia of chronic disease. 11.History of nonobstructive coronary disease.  ALLERGIES:  The patient is allergic to PROTAMINE, HYDROMORPHONE, MOXIFLOXACIN, CIPROFLOXACIN, and CEFTRIAXONE.  MEDICATIONS PRIOR TO ADMISSION:  Please refer to the med reconciliation.  SOCIAL HISTORY:  The patient denies any smoking, alcohol, or any drug use.  He currently lives at home and ambulates without any walker or cane.  PHYSICAL EXAMINATION:  VITAL SIGNS:  Temperature 100.6, blood  pressure 103/56, pulse rate 95, respirations 18. GENERAL:  The patient is alert, awake, and oriented x3, not in acute distress. HEENT:  Anicteric sclerae and conjunctivae.  Pupils reactive to light and accommodation.  EOMI. NECK:  Supple.  No lymphadenopathy, no JVD. CVS:  S1, S2 clear. CHEST:  Clear to auscultation bilaterally. ABDOMEN:  Soft, nontender, nondistended.  Normal bowel sounds. EXTREMITIES:  Left foot pain, amputation of all the toes.  No wounds noted on the right foot, foul-smelling drainage between the great toe and the third toe.  Eschar with dry gangrene on the lateral and medial side of the foot and in between the great and the third toe.  Paraplegia and warm to touch.  DIAGNOSTIC DATA:  BMET:  Sodium 136, potassium 3.8, BUN 16, creatinine 4.0, calcium 9.5, lactic acid 1.3.  CBC:  WBC is 8.9, hemoglobin 9.5, hematocrit 28.9, platelets 243,000.  RADIOLOGICAL DATA:  Chest x-ray 2-view August 6, cardiomegaly with pulmonary vascular cephalization but no frank edema.  Right foot x-ray August 6 showed mild irregularity at the distal aspect of the amputated second proximal phalanx due to recurrent osteomyelitis.  Dedicated MRI of this lesion may be helpful for better characterization.  IMPRESSION AND PLAN:  Mr. Nicol is a 50 year old male with history of end-stage renal disease, hypertension, history of intracerebral hemorrhage presents with right foot ulcer with gangrene and possible osteomyelitis. 1. Suspected osteomyelitis with the right foot draining ulcer and     gangrene.  The patient will be admitted to the Medicine Service.     Given that he has been on Keflex for the last 3 weeks and Zithromax     for the last week, I will place him on IV vancomycin and Zosyn at     this time.  Vascular surgery consult has been placed.  I discussed     personally with Dr. Imogene Burn from Vascular Surgery.  Per Dr. Nicky Pugh     recommendations, no need of any MRI at this time as the  x-ray does     show possible recurrent osteomyelitis and the patient needs     amputation at this time.  The patient has made the decision to go     for amputation at this time.  Dr. Edilia Bo will evaluate the patient     in a.m. per Dr. Nicky Pugh recommendations. 2. End-stage renal disease on hemodialysis on Monday, Wednesday and     Friday.  Renal Service is aware of the patient.  The patient was     sent from the Hemodialysis Center by Dr. Elvis Coil. 3. Diabetes mellitus.  Obtain HbA1c and continue the patient on     sliding scale insulin. 4. Hypothyroidism.  Obtain TSH and continue Synthroid. 5. History of renal and pancreatic transplant.  Continue  the     immunosuppressants and prednisone. 6. Anemia of chronic disease.  Continue to monitor the counts.  We     will obtain CBC in a.m., defer further management to Renal Service. 7. History of nonobstructive coronary artery disease, currently     asymptomatic. 8. History of intracerebral hemorrhage.  Continue Dilantin for now.     No heparin or Lovenox for deep venous thrombosis prophylaxis given     his history of intracerebral hemorrhage.  CODE STATUS:  I discussed in detail with the patient and he opted to be full code status.     Thad Ranger, MD     RR/MEDQ  D:  10/08/2010  T:  10/08/2010  Job:  [id number]  cc:   Fransisco Hertz, MD Di Kindle Edilia Bo, M.D. Garnetta Buddy, M.D. Modesto Charon  Electronically Signed by [employer]  on 10/10/2010 01:04:50 PM

## 2010-10-11 ENCOUNTER — Inpatient Hospital Stay (HOSPITAL_COMMUNITY): Payer: Medicare Other

## 2010-10-11 LAB — BASIC METABOLIC PANEL
Chloride: 97 mEq/L (ref 96–112)
GFR calc Af Amer: 14 mL/min — ABNORMAL LOW (ref >60)
GFR calc non Af Amer: 12 mL/min — ABNORMAL LOW (ref >60)
Glucose, Bld: 126 mg/dL — ABNORMAL HIGH (ref 70–99)
Potassium: 3.2 mEq/L — ABNORMAL LOW (ref 3.5–5.1)
Sodium: 138 mEq/L (ref 135–145)

## 2010-10-11 NOTE — Consult Note (Signed)
[name]:  [id number]  MEDICAL RECORD NO.:  [id number]  LOCATION:  5503                         FACILITY:  [facility]  PHYSICIAN:  Wilber Bihari. Caryn Section, M.D.   DATE OF BIRTH:  1960/08/21  DATE OF CONSULTATION:  10/09/2010 DATE OF DISCHARGE:                                CONSULTATION   [name] is a 50 year old white man admitted last night because of fever and worsening infection of right foot.  He had Type 1 DM prior to  pancreas transplant.He had  toes amputated on the right foot earlier this year secondary to gangrene. The toes are not healing, gangrene has worsened, and he was noted to have fever in dialysis yesterday.  He was admitted and a BK amputation is planned for today.  He has been followed in the past for this by Dr. Zachery Dakins (General Surgery) and Dr. Edilia Bo (Vascular Surgery).  PAST MEDICAL HISTORY: 1. ESRD secondary to diabetes mellitus - began hemodialysis in 1988,     1st transplant was in 1988, but only lasted for a few months.  He was     back on dialysis in 1988, also was on peritoneal dialysis.  Kidney     pancreas transplant was performed on February 28, 2001, the kidney     no longer works and he is back on dialysis as of August 2008.  The     pancreas is still working. 2. Ventricular tachycardia (defibrillator placed in November 2005),     perforated ventricle secondary to radiofrequency ablation of     ventricular tachycardia with tamponade in 2005, also cardiac     catheterization in 2005 showed "nonobstructive coronary artery     disease." 3. Cerebrovascular accident (November 2008), "intraventricular     hemorrhage," hospitalized at [facility], still on Dilantin. 4. Hypothyroidism. 5. Transmetatarsal amputation left foot. 6. Numerous clotted vascular accesses both arms.  Current AVG is in left     thigh. 7. Peptic ulcer disease (on Axid as an outpatient). 8. Glaucoma. 9. Asymptomatic gallstones.  FAMILY  HISTORY:  His father is 10 years old and healthy.  Mother age 56, had CABG several years ago.  He has 2 brothers and 1 sister who all have hypertension.  SOCIAL HISTORY:  He was born in [place].  He graduated from high school and attended 2 years of technical school.  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  His wife [name]) is a former Teacher, early years/pre.  They have been married for 20 years. They have no children between them.  She has a child by a former marriage.  REVIEW OF SYSTEMS:  No angina, no claudication, no melena, no hematochezia, no gross hematuria, no renal colic, no DOE, no orthopnea, no purulent sputum, no hemoptysis.  He did complain of a fever and weakness in dialysis yesterday.  PHYSICAL EXAMINATION:  GENERAL:  He is awake and alert.  He is chronically ill-appearing and is trying to be optimistic. VITAL SIGNS:  Temperature 99.6, pulse 80, respirations 18, blood pressure 122/66. CHEST:  AICD is present in the right subclavian area, clear to auscultation. HEART:  Regular rate and  rhythm.  No rub is heard. ABDOMEN:  Numerous scars are present from prior operations.  No organs or masses are felt.  Bowel sounds present.  No bruits are heard. GU and RECTAL:  Not done. EXTREMITIES:  TMA, left foot.  Gangrene is noted on right foot.  AVG is present on the left thigh.  Amputated distal phalanx, left middle finger. NEURO:  Right handed, decrease sensation in feet.  LAB:  Hemoglobin 9.9, WBC 9200, PLT is 271,000.  Sodium 138, potassium 3.9, chloride 95, CO2 31, glucose 91, BUN 26, CR 5.4, calcium 9.3. Intact PTH was 227 when checked on September 26, 2010.  Fe/TIBC 26% and ferritin 338 when checked on September 26, 2010.  IMPRESSION AND PLAN: 1. Gangrenous right foot--below-knee amputation today. 2. End-stage renal disease--hemodialysis Monday, Wednesday, and     Friday, will need decrease EDW following below-knee amputation. 3. Ventricular  tachycardia, S/P automatic implantable cardioverter-     defibrillator--turn off in the OR. 4. Cerebrovascular accident (interventricular hemorrhage), on     Dilantin.  We will check EEG while in hospital to see if he still     needs to be on Dilantin. 5. Hypothyroidism.  Continue Synthroid. 6. Functioning pancreas transplant.  Continue Imuran, Prograf, and     prednisone.  He will not require CBGs while in-hospital.  They have     been discontinued by Dr. Isidoro Donning. 7. Peptic ulcer disease (on Axid as an outpatient).  We will change     from PPI to H2 blocker while in-hospital. 8. Glaucoma.  Continue Lumigan. 9. Allergies.  He is reportedly allergic to ROCEPHIN, but has taken     Keflex in the past without problems, still allergic to AVELOX,     CIPRO, and PROTAMINE. 10.Asymptomatic gallstones, observe for now.     Brienne Liguori F. Caryn Section, M.D.     RFF/MEDQ  D:  10/09/2010  T:  10/09/2010  Job:  [id number]  Electronically Signed by Marina Gravel M.D. on 10/11/2010 11:52:50 AM

## 2010-10-12 ENCOUNTER — Inpatient Hospital Stay (HOSPITAL_COMMUNITY): Payer: Medicare Other

## 2010-10-12 DIAGNOSIS — L98499 Non-pressure chronic ulcer of skin of other sites with unspecified severity: Secondary | ICD-10-CM

## 2010-10-12 DIAGNOSIS — S88119A Complete traumatic amputation at level between knee and ankle, unspecified lower leg, initial encounter: Secondary | ICD-10-CM

## 2010-10-12 DIAGNOSIS — I739 Peripheral vascular disease, unspecified: Secondary | ICD-10-CM

## 2010-10-12 LAB — CBC
Hemoglobin: 9.7 g/dL — ABNORMAL LOW (ref 13.0–17.0)
MCHC: 32.6 g/dL (ref 30.0–36.0)
RDW: 15.8 % — ABNORMAL HIGH (ref 11.5–15.5)
WBC: 7.6 10*3/uL (ref 4.0–10.5)

## 2010-10-12 LAB — WOUND CULTURE: Gram Stain: NONE SEEN

## 2010-10-12 LAB — RENAL FUNCTION PANEL
Albumin: 2.1 g/dL — ABNORMAL LOW (ref 3.5–5.2)
Chloride: 97 mEq/L (ref 96–112)
GFR calc Af Amer: 10 mL/min — ABNORMAL LOW (ref >60)
Glucose, Bld: 91 mg/dL (ref 70–99)
Phosphorus: 3.6 mg/dL (ref 2.3–4.6)
Potassium: 4.6 mEq/L (ref 3.5–5.1)
Sodium: 137 mEq/L (ref 135–145)

## 2010-10-12 LAB — GLUCOSE, CAPILLARY: Glucose-Capillary: 129 mg/dL — ABNORMAL HIGH (ref 70–99)

## 2010-10-13 LAB — CROSSMATCH
Antibody Screen: NEGATIVE
Unit division: 0

## 2010-10-14 LAB — BASIC METABOLIC PANEL
Chloride: 98 mEq/L (ref 96–112)
GFR calc Af Amer: 10 mL/min — ABNORMAL LOW (ref >60)
Potassium: 3.7 mEq/L (ref 3.5–5.1)
Sodium: 138 mEq/L (ref 135–145)

## 2010-10-14 LAB — DIFFERENTIAL
Basophils Absolute: 0 10*3/uL (ref 0.0–0.1)
Eosinophils Absolute: 0.5 10*3/uL (ref 0.0–0.7)
Eosinophils Relative: 6 % — ABNORMAL HIGH (ref 0–5)
Neutrophils Relative %: 75 % (ref 43–77)

## 2010-10-14 LAB — CBC
Platelets: 436 10*3/uL — ABNORMAL HIGH (ref 150–400)
RDW: 15.8 % — ABNORMAL HIGH (ref 11.5–15.5)
WBC: 8.8 10*3/uL (ref 4.0–10.5)

## 2010-10-15 ENCOUNTER — Inpatient Hospital Stay (HOSPITAL_COMMUNITY): Payer: Medicare Other

## 2010-10-15 ENCOUNTER — Inpatient Hospital Stay (HOSPITAL_COMMUNITY)
Admission: RE | Admit: 2010-10-15 | Discharge: 2010-10-25 | DRG: 945 | Disposition: A | Discharge status: Home Health | Payer: Medicare Other | Source: Other Acute Inpatient Hospital | Attending: Physical Medicine & Rehabilitation | Admitting: Physical Medicine & Rehabilitation

## 2010-10-15 DIAGNOSIS — S88119A Complete traumatic amputation at level between knee and ankle, unspecified lower leg, initial encounter: Secondary | ICD-10-CM

## 2010-10-15 DIAGNOSIS — D631 Anemia in chronic kidney disease: Secondary | ICD-10-CM

## 2010-10-15 DIAGNOSIS — H409 Unspecified glaucoma: Secondary | ICD-10-CM

## 2010-10-15 DIAGNOSIS — E039 Hypothyroidism, unspecified: Secondary | ICD-10-CM

## 2010-10-15 DIAGNOSIS — Z9483 Pancreas transplant status: Secondary | ICD-10-CM

## 2010-10-15 DIAGNOSIS — N039 Chronic nephritic syndrome with unspecified morphologic changes: Secondary | ICD-10-CM

## 2010-10-15 DIAGNOSIS — Z85828 Personal history of other malignant neoplasm of skin: Secondary | ICD-10-CM

## 2010-10-15 DIAGNOSIS — E785 Hyperlipidemia, unspecified: Secondary | ICD-10-CM

## 2010-10-15 DIAGNOSIS — I120 Hypertensive chronic kidney disease with stage 5 chronic kidney disease or end stage renal disease: Secondary | ICD-10-CM

## 2010-10-15 DIAGNOSIS — Z5189 Encounter for other specified aftercare: Principal | ICD-10-CM

## 2010-10-15 DIAGNOSIS — S98919A Complete traumatic amputation of unspecified foot, level unspecified, initial encounter: Secondary | ICD-10-CM

## 2010-10-15 DIAGNOSIS — D62 Acute posthemorrhagic anemia: Secondary | ICD-10-CM

## 2010-10-15 DIAGNOSIS — G40909 Epilepsy, unspecified, not intractable, without status epilepticus: Secondary | ICD-10-CM

## 2010-10-15 DIAGNOSIS — Z992 Dependence on renal dialysis: Secondary | ICD-10-CM

## 2010-10-15 DIAGNOSIS — K279 Peptic ulcer, site unspecified, unspecified as acute or chronic, without hemorrhage or perforation: Secondary | ICD-10-CM

## 2010-10-15 DIAGNOSIS — I739 Peripheral vascular disease, unspecified: Secondary | ICD-10-CM

## 2010-10-15 DIAGNOSIS — I96 Gangrene, not elsewhere classified: Secondary | ICD-10-CM

## 2010-10-15 DIAGNOSIS — Z8673 Personal history of transient ischemic attack (TIA), and cerebral infarction without residual deficits: Secondary | ICD-10-CM

## 2010-10-15 DIAGNOSIS — I70269 Atherosclerosis of native arteries of extremities with gangrene, unspecified extremity: Secondary | ICD-10-CM

## 2010-10-15 DIAGNOSIS — H548 Legal blindness, as defined in USA: Secondary | ICD-10-CM

## 2010-10-15 DIAGNOSIS — N186 End stage renal disease: Secondary | ICD-10-CM

## 2010-10-15 DIAGNOSIS — Y830 Surgical operation with transplant of whole organ as the cause of abnormal reaction of the patient, or of later complication, without mention of misadventure at the time of the procedure: Secondary | ICD-10-CM

## 2010-10-15 DIAGNOSIS — T8610 Unspecified complication of kidney transplant: Secondary | ICD-10-CM

## 2010-10-15 DIAGNOSIS — N2581 Secondary hyperparathyroidism of renal origin: Secondary | ICD-10-CM

## 2010-10-15 DIAGNOSIS — L02419 Cutaneous abscess of limb, unspecified: Secondary | ICD-10-CM

## 2010-10-15 LAB — RENAL FUNCTION PANEL
Albumin: 2 g/dL — ABNORMAL LOW (ref 3.5–5.2)
BUN: 48 mg/dL — ABNORMAL HIGH (ref 6–23)
Chloride: 97 mEq/L (ref 96–112)
Glucose, Bld: 101 mg/dL — ABNORMAL HIGH (ref 70–99)
Potassium: 4.1 mEq/L (ref 3.5–5.1)

## 2010-10-15 LAB — CBC
HCT: 28.5 % — ABNORMAL LOW (ref 39.0–52.0)
Hemoglobin: 9.6 g/dL — ABNORMAL LOW (ref 13.0–17.0)
RBC: 3.17 MIL/uL — ABNORMAL LOW (ref 4.22–5.81)
RDW: 15.8 % — ABNORMAL HIGH (ref 11.5–15.5)
WBC: 7.3 10*3/uL (ref 4.0–10.5)

## 2010-10-15 LAB — CULTURE, BLOOD (ROUTINE X 2)
Culture  Setup Time: 201208070040
Culture: NO GROWTH

## 2010-10-15 NOTE — Procedures (Unsigned)
REFERRING PHYSICIAN:  Dr. Caryn Section  HISTORY:  The patient is 50 year old man who is a dialysis patient and has altered mental status, EEG is for evaluation.  MEDICATIONS:  He is on midodrine, Crestor, Dilantin, Zyrtec, Nephro, Synthroid, prednisone, azathioprine, ProAir, Prograf, loperamide.  EEG DURATION:  21.5 minutes of EEG recording.  EEG DESCRIPTION:  This is a routine 16 channel adult EEG recording with one channel devoted to limited EKG recording.  Activation procedure is performed during the photic stimulation.  The study is performed in awake and mild drowsy state.  As the EEG opens up, I noticed that the posterior dominant rhythm consists of mid theta to high theta ranges up to 7 Hz at the most. There is well-preserved anterior-posterior gradient and significant superimposed beta activity noted throughout the study.  Some vertex waves and synchronous spindles were observed as the patient got drowsy during the study.  However, there is no evidence to suggest electrographic seizures or epileptiform discharges.  No driving was noted with the photic stimulation and posterior leads.  EEG INTERPRETATION:  This EEG may reflect subtle mild generalized slowing reflecting a very mild nonspecific encephalopathy.  However, it could be normal for this patient as well.  There is no evidence to suggest electrographic seizure or epileptiform discharges based on this study.          ______________________________ Levie Heritage, MD    ZO:XWRU D:  10/11/2010 16:09:19  T:  10/12/2010 00:57:35  Job #:  [id number]

## 2010-10-16 DIAGNOSIS — S88119A Complete traumatic amputation at level between knee and ankle, unspecified lower leg, initial encounter: Secondary | ICD-10-CM

## 2010-10-16 DIAGNOSIS — I70269 Atherosclerosis of native arteries of extremities with gangrene, unspecified extremity: Secondary | ICD-10-CM

## 2010-10-16 LAB — GLUCOSE, CAPILLARY: Glucose-Capillary: 105 mg/dL — ABNORMAL HIGH (ref 70–99)

## 2010-10-16 NOTE — H&P (Signed)
NAMEAUTHOR, HATLESTAD NO.:  [id number]  MEDICAL RECORD NO.:  [id number]  LOCATION:  4031                         FACILITY:  [facility]  PHYSICIAN:  Ranelle Oyster, M.D.DATE OF BIRTH:  1960-05-16  DATE OF ADMISSION:  10/15/2010 DATE OF DISCHARGE:                             HISTORY & PHYSICAL   PRIMARY CARE PHYSICIAN:  Modesto Charon, MD  CHIEF COMPLAINT:  Right leg pain and cellulitis with amputation.  HISTORY OF PRESENT ILLNESS:  This is a 50 year old male with end-stage renal disease and PVD with nonhealing right foot wound, ultimately failing conservative care.  He developed dry gangrene, started on IV antibiotics.  Ultimately, the patient was seen by Dr. Myra Gianotti and underwent right below-knee amputation on October 09, 2010.  IV antibiotics were stopped on October 13, 2010.  He has had mental status changes on hemodialysis.  On October 11, 2010, EEG showed generalized slowing, no seizure.  He has had mild erythema and 2 areas of blisters at the incision.  Antibiotics were still recommended at that point.  Therapies were initiated.  The patient has had problems with pain and poor tolerance of activity with the therapy.  REVIEW OF SYSTEMS:  Notable for numbness, diarrhea which has been chronic since O2.  The patient complained of some flushing and dizziness today on dialysis while I examined him.  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  FAMILY HISTORY:  Positive for CAD and hypertension.  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  He does not smoke and drink.  He lives in 1-level house with 7 steps at entry with family working on rant.  Wife works for shift.  ALLERGIES:  PROTAMINE, HYDROMORPHONE, AVELOX, CIPRO, and CEFTRIAXONE.  MEDICATIONS AND LABS:  Please see written H and P for details.  PHYSICAL EXAMINATION:  VITAL SIGNS:  Blood pressure 141/60, pulse 79, respiratory 18, temperature 99. GENERAL:  The patient is lying in bed in some distress. HEENT:  Pupils equally, round, reactive to light.  Ear, nose, throat exam notable for intact mucosa.  Dentition was fair. NECK:  Supple without JVD or lymphadenopathy. CHEST:  Clear to auscultation bilaterally without wheezes, rales or rhonchi. HEART:  Notable for systolic murmur. EXTREMITIES:  No clubbing, cyanosis or edema.  Right leg was wrapped and appropriately tender. ABDOMEN:  Soft, nontender with well-healed abdominal incision noted. NEUROLOGIC:  Cranial nerves II-XII notable for legal blindness.  The patient is able to discern general objects, colors, and movements.  Reflexes are 1+.  Sensation diminished in distal left lower extremity as well as the bilateral hands in stocking-glove distribution.  Judgment, orientation, memory are fair.  Mood was flat, sometimes anxious.  Upper extremity strength is grossly 4/5 proximal and distal.  Right lower extremity approximately 2/5 left hip and knee, 3/5 left lower extremity  with some pain noted there. SKIN: R BKA site is well approximated, however there are several blisters around the wound line with surrounding erythema.  There is no gross breakdown at this point.  Leg is tender.  POSTADMISSION PHYSICIAN EVALUATION: 1. Functional deficit secondary to gangrene of the right foot and     cellulitis, ultimately necessitating a right below-knee amputation.     The patient with significant postoperative weakness, and he is legally blind as well. 2. The  patient is admitted to receive collaborative interdisciplinary     care between the physiatrist rehab nursing staff and therapy team. 3. The patient's level of medical complexity and substantial therapy     needs in context of that medical necessity cannot be provided in a     lesser intensity of care. 4. The patient has experienced substantial functional loss from his     baseline.  Premorbidly, he was independent, but  not driving.     Currently, he is supervision bed mobility, min assist transfer, min     assist ambulating only 18-20 feet rolling walker with anxiety and     pain.  He has set up upper body care, max assist to total assist     lower body care.  Judging by the patient's diagnosis, physical exam     and functional history, he has potential for functional progress     which will result in measurable gains while inpatient rehab.  These     gains will be of substantial and practical use upon discharge to     home in facilitating mobility and self care. 5. Physiatrist will provide 24-hour management of medical needs as     well as oversight of therapy plan assess treatment provide guidance     as appropriate guarding interaction of the two.  Medical problem     list and plan are below. 6. Twenty-four-hour rehab nursing will assist in management of the     patient's skin care as well as pain, integration therapy, concept     techniques, education, etc. 7. PT will assess and treat for lower extremity strength, range of     motion, functional mobility, pain management, preprosthetic     mobility and education with goals, modified independent at the     wheelchair level for short distances with a walker. 8. OT will assess and treat for upper extremity use ADLs, adaptive     equipment, functional mobility, preprosthetic education and balance     training with goals modified independent. 9. Case management and social worker will assess and treat for     psychosocial issues and  discharge planning. 10.Team conference will be held weekly to assess progress towards     goals and to determine barriers at discharge. 11.The patient has demonstrated sufficient medical stability and     exercise capacity to tolerate at least 3 hours of therapy per day     at least 5 days per week. 12.Estimated length of stay is 1 week to 10 days with goals overall     modified independent around.    MEDICAL PROBLEM LIST AND PLAN:  13.Deep venous thrombosis prophylaxis with subcu Lovenox.  To continue     until ambulation and mobility improves enough. 14.Pain management:  Consider scheduling pain medication with     activities.  Need to be observance of mental status overall with     medications, however. 15.End-stage renal disease:  He is on hemodialysis Monday, Wednesday  and Friday.  This will be scheduled after therapy session to     maximize his mobility and activity tolerance.  We will check daily     weights and strict I's and O's while with [place] on rehab.  Encourage     adequate intake and intake within his low diet restrictions. 16.Type 1 diabetes:  The patient has functioning pancreas.  No CBGs     required per Renal at this point. 17.Peptic ulcer disease:  Pepcid q. day. 18.Seizure disorder:  Dilantin t.i.d.  No active seizures at present     or history of that recently.  Recent EEG was negative. 19.Anemia of chronic disease, on Aranesp.  Follow H and H.  Look for     any signs of active bleeding on examination going forward and check     serial labs with hemodialysis. 20.Hypothyroidism:  Continue supplementation. 21.Wound care:  Continue dry dressing with close observation of wound     with the assistance of surgery.  Need to be observant.  The     patient's immunosuppressing medications may certainly lead to complications with wound     healing in this situation.     Ranelle Oyster, M.D.     ZTS/MEDQ  D:  10/15/2010  T:  10/16/2010  Job:  [id number]  cc:    Modesto Charon  Electronically Signed by Faith Rogue M.D. on 10/16/2010 11:48:33 AM

## 2010-10-17 ENCOUNTER — Inpatient Hospital Stay (HOSPITAL_COMMUNITY): Payer: Medicare Other

## 2010-10-17 ENCOUNTER — Ambulatory Visit: Payer: Medicare Other | Admitting: Vascular Surgery

## 2010-10-17 DIAGNOSIS — I70269 Atherosclerosis of native arteries of extremities with gangrene, unspecified extremity: Secondary | ICD-10-CM

## 2010-10-17 DIAGNOSIS — S88119A Complete traumatic amputation at level between knee and ankle, unspecified lower leg, initial encounter: Secondary | ICD-10-CM

## 2010-10-17 LAB — RENAL FUNCTION PANEL
Albumin: 2.3 g/dL — ABNORMAL LOW (ref 3.5–5.2)
CO2: 25 mEq/L (ref 19–32)
Calcium: 9.5 mg/dL (ref 8.4–10.5)
GFR calc Af Amer: 8 mL/min — ABNORMAL LOW (ref >60)
GFR calc non Af Amer: 7 mL/min — ABNORMAL LOW (ref >60)
Phosphorus: 4.7 mg/dL — ABNORMAL HIGH (ref 2.3–4.6)
Sodium: 136 mEq/L (ref 135–145)

## 2010-10-17 LAB — CBC
MCH: 29.9 pg (ref 26.0–34.0)
MCHC: 33.3 g/dL (ref 30.0–36.0)
Platelets: 500 10*3/uL — ABNORMAL HIGH (ref 150–400)
RBC: 3.14 MIL/uL — ABNORMAL LOW (ref 4.22–5.81)

## 2010-10-18 ENCOUNTER — Inpatient Hospital Stay (HOSPITAL_COMMUNITY): Payer: Medicare Other

## 2010-10-19 ENCOUNTER — Inpatient Hospital Stay (HOSPITAL_COMMUNITY): Payer: Medicare Other

## 2010-10-19 ENCOUNTER — Encounter: Payer: Self-pay | Admitting: *Deleted

## 2010-10-19 LAB — CBC
MCHC: 33.3 g/dL (ref 30.0–36.0)
Platelets: 463 10*3/uL — ABNORMAL HIGH (ref 150–400)
RDW: 16.3 % — ABNORMAL HIGH (ref 11.5–15.5)

## 2010-10-19 LAB — RENAL FUNCTION PANEL
Albumin: 2.4 g/dL — ABNORMAL LOW (ref 3.5–5.2)
GFR calc Af Amer: 10 mL/min — ABNORMAL LOW (ref >60)
Phosphorus: 4 mg/dL (ref 2.3–4.6)
Potassium: 3.6 mEq/L (ref 3.5–5.1)
Sodium: 136 mEq/L (ref 135–145)

## 2010-10-20 LAB — GLUCOSE, CAPILLARY: Glucose-Capillary: 81 mg/dL (ref 70–99)

## 2010-10-22 ENCOUNTER — Inpatient Hospital Stay (HOSPITAL_COMMUNITY): Payer: Medicare Other

## 2010-10-22 LAB — RENAL FUNCTION PANEL
Calcium: 9.8 mg/dL (ref 8.4–10.5)
Creatinine, Ser: 8.43 mg/dL — ABNORMAL HIGH (ref 0.50–1.35)
GFR calc Af Amer: 8 mL/min — ABNORMAL LOW (ref >60)
GFR calc non Af Amer: 7 mL/min — ABNORMAL LOW (ref >60)
Glucose, Bld: 93 mg/dL (ref 70–99)
Phosphorus: 3.2 mg/dL (ref 2.3–4.6)
Sodium: 136 mEq/L (ref 135–145)

## 2010-10-22 LAB — CBC
MCH: 29.3 pg (ref 26.0–34.0)
MCHC: 32.6 g/dL (ref 30.0–36.0)
Platelets: 422 10*3/uL — ABNORMAL HIGH (ref 150–400)

## 2010-10-24 LAB — RENAL FUNCTION PANEL
Albumin: 2.4 g/dL — ABNORMAL LOW (ref 3.5–5.2)
BUN: 41 mg/dL — ABNORMAL HIGH (ref 6–23)
Calcium: 9.4 mg/dL (ref 8.4–10.5)
Phosphorus: 4.7 mg/dL — ABNORMAL HIGH (ref 2.3–4.6)
Potassium: 4.5 mEq/L (ref 3.5–5.1)

## 2010-10-24 LAB — CBC
HCT: 28.5 % — ABNORMAL LOW (ref 39.0–52.0)
MCH: 29.8 pg (ref 26.0–34.0)
MCHC: 33 g/dL (ref 30.0–36.0)
RDW: 17.8 % — ABNORMAL HIGH (ref 11.5–15.5)

## 2010-10-28 ENCOUNTER — Emergency Department (HOSPITAL_COMMUNITY): Payer: Medicare Other

## 2010-10-28 ENCOUNTER — Emergency Department (HOSPITAL_COMMUNITY)
Admission: EM | Admit: 2010-10-28 | Discharge: 2010-10-28 | Disposition: A | Discharge status: Home / Self Care | Payer: Medicare Other | Attending: Emergency Medicine | Admitting: Emergency Medicine

## 2010-10-28 DIAGNOSIS — E119 Type 2 diabetes mellitus without complications: Secondary | ICD-10-CM | POA: Insufficient documentation

## 2010-10-28 DIAGNOSIS — S8010XA Contusion of unspecified lower leg, initial encounter: Secondary | ICD-10-CM | POA: Insufficient documentation

## 2010-10-28 DIAGNOSIS — W19XXXA Unspecified fall, initial encounter: Secondary | ICD-10-CM | POA: Insufficient documentation

## 2010-10-28 DIAGNOSIS — Y92009 Unspecified place in unspecified non-institutional (private) residence as the place of occurrence of the external cause: Secondary | ICD-10-CM | POA: Insufficient documentation

## 2010-10-28 DIAGNOSIS — Z950 Presence of cardiac pacemaker: Secondary | ICD-10-CM | POA: Insufficient documentation

## 2010-10-28 DIAGNOSIS — I10 Essential (primary) hypertension: Secondary | ICD-10-CM | POA: Insufficient documentation

## 2010-10-28 DIAGNOSIS — M79609 Pain in unspecified limb: Secondary | ICD-10-CM | POA: Insufficient documentation

## 2010-10-28 DIAGNOSIS — S0990XA Unspecified injury of head, initial encounter: Secondary | ICD-10-CM | POA: Insufficient documentation

## 2010-10-28 LAB — CBC
HCT: 32.7 % — ABNORMAL LOW (ref 39.0–52.0)
MCV: 94.8 fL (ref 78.0–100.0)
RBC: 3.45 MIL/uL — ABNORMAL LOW (ref 4.22–5.81)
RDW: 19.6 % — ABNORMAL HIGH (ref 11.5–15.5)
WBC: 7.7 10*3/uL (ref 4.0–10.5)

## 2010-10-28 LAB — DIFFERENTIAL
Basophils Absolute: 0 10*3/uL (ref 0.0–0.1)
Basophils Relative: 1 % (ref 0–1)
Eosinophils Absolute: 0.4 10*3/uL (ref 0.0–0.7)
Eosinophils Relative: 5 % (ref 0–5)
Monocytes Absolute: 0.6 10*3/uL (ref 0.1–1.0)
Monocytes Relative: 8 % (ref 3–12)

## 2010-10-28 LAB — POCT I-STAT, CHEM 8
BUN: 39 mg/dL — ABNORMAL HIGH (ref 6–23)
Calcium, Ion: 1.21 mmol/L (ref 1.12–1.32)
Creatinine, Ser: 6.5 mg/dL — ABNORMAL HIGH (ref 0.50–1.35)
Glucose, Bld: 88 mg/dL (ref 70–99)
Hemoglobin: 11.6 g/dL — ABNORMAL LOW (ref 13.0–17.0)
Sodium: 141 mEq/L (ref 135–145)
TCO2: 27 mmol/L (ref 0–100)

## 2010-10-31 ENCOUNTER — Encounter: Payer: Self-pay | Admitting: Physician Assistant

## 2010-11-01 NOTE — Op Note (Signed)
[name], BOIVIN NO.:  [id number]  MEDICAL RECORD NO.:  [id number]  LOCATION:  5503                         FACILITY:  [facility]  PHYSICIAN:  Juleen China IV, MDDATE OF BIRTH:  Sep 30, 1960  DATE OF PROCEDURE:  10/09/2010 DATE OF DISCHARGE:                              OPERATIVE REPORT   PREOPERATIVE DIAGNOSIS:  Ischemic right leg.  POSTOPERATIVE DIAGNOSIS:  Ischemic right leg.  PROCEDURE PERFORMED:  Right below-knee amputation.  SURGEON: 1. Charlena Cross, MD  ASSISTANT:  Della Goo, PA-C  SPECIMENS:  Right leg.  BLOOD LOSS:  100 mL.  FINDINGS:  Viable muscle, healthy bone.  INDICATIONS:  This is a 50 year old gentleman with multiple medical issues who has been followed in the office by Dr. Edilia Bo for nonhealing right foot wound.  He has previously undergone angiography and it was felt that revascularization would not assist wound healing.  He comes in today on the brink of sepsis and amputation has been recommended.  I discussed this in the holding area with the patient.  He understands the risks and benefits of the operation including the need for more proximal revision if this does not heal.  I do not feel like he is a good candidate for transmetatarsal amputation as he has cellulitis extending up onto the dorsum of his foot.  PROCEDURE:  The patient was identified in the holding area and taken to room #8, placed supine on the table.  General anesthesia was administered.  The patient was prepped and draped in the usual fashion. A time-out was called.  Antibiotics were being administered as the patient is on antibiotics.  A site was selected 12 cm below the tibial tuberosity.  A circumferential calf measurement was made.  I then created a posterior flap making an incision two thirds of the way around the leg and then traveling one-third of the circumference distally and connecting the incision posteriorly.  Cautery was used to  divide the subcutaneous tissue down to the fascia, which was also divided with cautery.  Muscle was divided with Bovie cautery.  I created a plane behind the tibia. Circumferential exposure of the tibia was then performed.  A periosteal elevator was used to elevate the periosteum.  Similarly, the tibia was circumferentially exposed and periosteum elevated with periosteal elevator.  A giggly saw was then used to transect the tibia, beveling the anterior surface.  Bone cutters were used to transect the fibula. Next, using cautery, the remaining portion of the muscle was divided. The neurovascular bundle was clamped with a hemostat.  The remaining tissue was divided with cautery and the below-knee leg was sent off as specimen.  I then dissected out the neurovascular bundles.  The anterior tibial neurovascular bundle was divided between 3-0 silk ties.  This was done proximal to the cut edge of the bone.  Similarly, the popliteal vessels and tibial nerve were individually isolated and ligated 0 silk ties.  They were transected proximal to the cut edge of the tibia. Next, a rasp was used to smooth the surface of the tibia.  The rongeurs were used to get a smooth surface on the fibula.  The wound was then  copiously irrigated.  Once I was satisfied with hemostasis, the fascia was reapproximated with interrupted figure-of-eight 2-0 Vicryl sutures. The skin was closed with staples.  Sterile dressings were applied and there were no complications.  He was taken to the recovery room in stable condition.     [name], MD     VWB/MEDQ  D:  10/09/2010  T:  10/10/2010  Job:  [id number]  Electronically Signed by Arelia Longest IV MD on 11/01/2010 12:05:08 AM

## 2010-11-07 ENCOUNTER — Ambulatory Visit (INDEPENDENT_AMBULATORY_CARE_PROVIDER_SITE_OTHER): Payer: Medicare Other | Admitting: Physician Assistant

## 2010-11-07 VITALS — BP 119/64 | HR 78

## 2010-11-07 DIAGNOSIS — T8189XA Other complications of procedures, not elsewhere classified, initial encounter: Secondary | ICD-10-CM

## 2010-11-07 DIAGNOSIS — I7092 Chronic total occlusion of artery of the extremities: Secondary | ICD-10-CM

## 2010-11-07 NOTE — Progress Notes (Signed)
[facility] [place]  Postoperative Visit  History of Present Illness  [name] is a 50 y.o. male who presents for postoperative follow-up for: right below-the-knee amputation (Date: 10/09/10).  The patient's wounds are not healing.  There is ischemia of the posterior flap with areas of ruptured pustules.  The flap is black.  Staples are in place and there is no drainage.  He states he fell several weeks ago after taking a bath and landed on his amp.  The patient notes pain is well controlled.   He dialyzes on MWF.  Past Medical History  Diagnosis Date  . Secondary hyperparathyroidism, renal     In setting of ESRD.  [name] Hypothyroidism   . Automatic implantable cardiac defibrillator in situ     History of implanted dual-chamber ICD in 01/2004, with removal and insertion of a Bi-V ICD in 08/2009 by Dr. 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 Hypertension   . Nonischemic cardiomyopathy     2D-echocardiogram (08/2007) - LV EF 45%, akinesis of inferoseptal wall, inferior wall,   . ESRD on hemodialysis Started age 18yo    on MWF schedule. Previous failed renal transplant 1998 at [facility] (with rejection in 1998). 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 Resumed HD 10/2006.   [name] DM type 1 (diabetes mellitus, type 1) DX: age 36yo    previously well controlled with Aic 5 (12/2008)  . Intracranial hemorrhage     History of. On prophylactic dilantin.  . 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  . Hyperlipidemia   . Hemopericardium     History of in setting of failed radiogrequency ablation for Vtach  . Anemia of chronic disease     BL Hgb 11-13. In setting of ESRD.   [name] Peptic ulcer disease     Unknown history, no records per [employer].   . Cholelithiasis     Noted at least since 01/2006. Asymptomatic.    . Erectile dysfunction   . Coronary artery disease     nonobstructive per cardiac cath (01/2004). LAD has 20% multiple discrete lesions in med and distal portion.   . Peripheral autonomic neuropathy due to diabetes mellitus   . GERD (gastroesophageal reflux disease)   . Glaucoma   . Squamous cell skin cancer, finger     History of. Marginal resection in 05/2008 with recurrent infection/ lytic lesion involving distal phalanx  . Toe amputation status     Ischemic fourth and fifith toe left (03/2009), history of previous 1,2,3 toe  amputations.   [name] MRSA (methicillin resistant staph aureus) culture positive     Verify type - Per medical history form dated 02/13/10.  . Kidney disease     Per medical history form dated 02/13/10.  [name] Difficulty urinating     per medical history dated 02/13/10.  [name] Poor circulation     per medical history form dated 02/13/10.    History  Substance Use Topics  . Smoking status: Never Smoker   . Smokeless tobacco: Not on file  . 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 Hypertension Brother   . Hypertension Sister   . Coronary artery disease Mother     x 5 vessel graft in her 32s  .  Hypertension Mother   . Prostate cancer Father   . Hypertension Father   . Hypertension Brother     Allergies  Allergen Reactions  . Avelox (Moxifloxacin Hcl In Nacl)     Ask patient to clarify type of medication. Also need to know type/severity/reaction.  . Ceftriaxone Sodium Nausea And Vomiting  . Protamine     Significant hypotension?  . Rocephin (Ceftriaxone Sodium In Dextrose)     Ask patient to clarify type of medication. Also need to know type/severity/reaction.  . Ciprofloxacin Rash  . Moxifloxacin Rash    Current outpatient prescriptions:azaTHIOprine (IMURAN) 50 MG tablet, Take 1 and a half tablets once daily. , Disp: , Rfl: ;  azithromycin (ZITHROMAX) 250 MG tablet, Take 250 mg by  mouth daily.  , Disp: , Rfl: ;  B Complex-C-Folic Acid (B COMPLEX-VITAMIN C-FOLIC ACID) 1 MG tablet, Take 1 tablet by mouth daily.  , Disp: , Rfl: ;  bimatoprost (LUMIGAN) 0.03 % ophthalmic drops, 1 gtt once daily , Disp: , Rfl:  Calcium Polycarbophil (EQUALACTIN PO), Take 2 tablets by mouth 2 (two) times daily.  , Disp: , Rfl: ;  cetirizine (ZYRTEC) 10 MG tablet, Take 10 mg by mouth daily.  , Disp: , Rfl: ;  IPRATROPIUM-ALBUTEROL IN, Inhale 1-2 Inhalers into the lungs. Take every 4-6 hrs , Disp: , Rfl: ;  lanthanum (FOSRENOL) 1000 MG chewable tablet, 1 tab four times a day , Disp: , Rfl:  levothyroxine (SYNTHROID, LEVOTHROID) 200 MCG tablet, Take 200 mcg by mouth daily.  , Disp: , Rfl: ;  loperamide (IMODIUM A-D) 2 MG tablet, Take 2 mg by mouth 2 (two) times daily.  , Disp: , Rfl: ;  losartan (COZAAR) 25 MG tablet, Take 25 mg by mouth daily.  , Disp: , Rfl: ;  Lutein 20 MG TABS, Take 1 tablet by mouth daily.  , Disp: , Rfl: ;  midodrine (PROAMATINE) 5 MG tablet, Take one by mouth before dialysis and if BP < 100 , Disp: , Rfl:  nizatidine (AXID) 150 MG capsule, Take 150 mg by mouth daily.  , Disp: , Rfl: ;  Omega-3 Fatty Acids (SALMON OIL) 200 MG CAPS, Take 1 cap twice a day, Disp: , Rfl: ;  phenytoin (DILANTIN) 100 MG ER capsule, Take by mouth 3 (three) times daily.  , Disp: , Rfl: ;  predniSONE (DELTASONE) 5 MG tablet, Take 5 mg by mouth daily.  , Disp: , Rfl: ;  rosuvastatin (CRESTOR) 5 MG tablet, Take one tablet by mouth every other day. , Disp: , Rfl:  tacrolimus (PROGRAF) 1 MG capsule, 2 capsules, two times a day , Disp: , Rfl: ;  vitamin C (ASCORBIC ACID) 500 MG tablet, Take 500 mg by mouth 2 (two) times daily.  , Disp: , Rfl:     Physical Examination  Filed Vitals:   11/07/10 1402  BP: 119/64  Pulse: 78   Wound is not healing and there is eschar present over entire flap.  No drainage or erythema present.   Staples are intact.  Medical Decision Making  [name] is a 50 y.o. male who  presents s/p right below the-knee ampuation.  The patient's stump is not healing appropriately.  He will require a revision with right above the knee amputation which he would like done as soon as possible.  This will be scheduled with Dr. Myra Gianotti 11/09/10.  Pt will dialyze on Thurs 11/08/10 instead of his normal schedule so that he can proceed with surgery  on Friday.   Clinic MD is Edilia Bo

## 2010-11-09 ENCOUNTER — Inpatient Hospital Stay (HOSPITAL_COMMUNITY)
Admission: RE | Admit: 2010-11-09 | Discharge: 2010-11-16 | DRG: 474 | Disposition: A | Discharge status: Home Health | Payer: Medicare Other | Source: Ambulatory Visit | Attending: Surgery | Admitting: Surgery

## 2010-11-09 ENCOUNTER — Other Ambulatory Visit: Payer: Self-pay | Admitting: Surgery

## 2010-11-09 DIAGNOSIS — Z992 Dependence on renal dialysis: Secondary | ICD-10-CM

## 2010-11-09 DIAGNOSIS — H548 Legal blindness, as defined in USA: Secondary | ICD-10-CM | POA: Diagnosis present

## 2010-11-09 DIAGNOSIS — T8789 Other complications of amputation stump: Principal | ICD-10-CM | POA: Diagnosis present

## 2010-11-09 DIAGNOSIS — K219 Gastro-esophageal reflux disease without esophagitis: Secondary | ICD-10-CM | POA: Diagnosis present

## 2010-11-09 DIAGNOSIS — I428 Other cardiomyopathies: Secondary | ICD-10-CM | POA: Diagnosis present

## 2010-11-09 DIAGNOSIS — N186 End stage renal disease: Secondary | ICD-10-CM | POA: Diagnosis present

## 2010-11-09 DIAGNOSIS — E039 Hypothyroidism, unspecified: Secondary | ICD-10-CM | POA: Diagnosis present

## 2010-11-09 DIAGNOSIS — I120 Hypertensive chronic kidney disease with stage 5 chronic kidney disease or end stage renal disease: Secondary | ICD-10-CM | POA: Diagnosis present

## 2010-11-09 DIAGNOSIS — D631 Anemia in chronic kidney disease: Secondary | ICD-10-CM | POA: Diagnosis present

## 2010-11-09 DIAGNOSIS — Z79899 Other long term (current) drug therapy: Secondary | ICD-10-CM

## 2010-11-09 DIAGNOSIS — I70219 Atherosclerosis of native arteries of extremities with intermittent claudication, unspecified extremity: Secondary | ICD-10-CM

## 2010-11-09 DIAGNOSIS — S98139A Complete traumatic amputation of one unspecified lesser toe, initial encounter: Secondary | ICD-10-CM

## 2010-11-09 DIAGNOSIS — E1069 Type 1 diabetes mellitus with other specified complication: Secondary | ICD-10-CM | POA: Diagnosis not present

## 2010-11-09 DIAGNOSIS — Z95810 Presence of automatic (implantable) cardiac defibrillator: Secondary | ICD-10-CM

## 2010-11-09 DIAGNOSIS — G609 Hereditary and idiopathic neuropathy, unspecified: Secondary | ICD-10-CM | POA: Diagnosis present

## 2010-11-09 DIAGNOSIS — N039 Chronic nephritic syndrome with unspecified morphologic changes: Secondary | ICD-10-CM | POA: Diagnosis present

## 2010-11-09 DIAGNOSIS — N2581 Secondary hyperparathyroidism of renal origin: Secondary | ICD-10-CM | POA: Diagnosis present

## 2010-11-09 DIAGNOSIS — Z8673 Personal history of transient ischemic attack (TIA), and cerebral infarction without residual deficits: Secondary | ICD-10-CM

## 2010-11-09 DIAGNOSIS — K449 Diaphragmatic hernia without obstruction or gangrene: Secondary | ICD-10-CM | POA: Diagnosis present

## 2010-11-09 DIAGNOSIS — I2510 Atherosclerotic heart disease of native coronary artery without angina pectoris: Secondary | ICD-10-CM | POA: Diagnosis present

## 2010-11-09 DIAGNOSIS — Y835 Amputation of limb(s) as the cause of abnormal reaction of the patient, or of later complication, without mention of misadventure at the time of the procedure: Secondary | ICD-10-CM | POA: Diagnosis present

## 2010-11-09 DIAGNOSIS — Y92009 Unspecified place in unspecified non-institutional (private) residence as the place of occurrence of the external cause: Secondary | ICD-10-CM

## 2010-11-09 DIAGNOSIS — IMO0002 Reserved for concepts with insufficient information to code with codable children: Secondary | ICD-10-CM

## 2010-11-09 DIAGNOSIS — Z9483 Pancreas transplant status: Secondary | ICD-10-CM

## 2010-11-09 DIAGNOSIS — I70209 Unspecified atherosclerosis of native arteries of extremities, unspecified extremity: Secondary | ICD-10-CM | POA: Diagnosis present

## 2010-11-09 DIAGNOSIS — G40909 Epilepsy, unspecified, not intractable, without status epilepticus: Secondary | ICD-10-CM | POA: Diagnosis present

## 2010-11-09 HISTORY — PX: ABOVE KNEE LEG AMPUTATION: SUR20

## 2010-11-09 LAB — POCT I-STAT 4, (NA,K, GLUC, HGB,HCT)
Glucose, Bld: 73 mg/dL (ref 70–99)
HCT: 41 % (ref 39.0–52.0)
Potassium: 3.9 mEq/L (ref 3.5–5.1)

## 2010-11-09 LAB — BASIC METABOLIC PANEL
BUN: 21 mg/dL (ref 6–23)
CO2: 34 mEq/L — ABNORMAL HIGH (ref 19–32)
GFR calc non Af Amer: 13 mL/min — ABNORMAL LOW (ref >60)
Glucose, Bld: 73 mg/dL (ref 70–99)
Potassium: 3.8 mEq/L (ref 3.5–5.1)
Sodium: 143 mEq/L (ref 135–145)

## 2010-11-09 LAB — CBC
HCT: 38.5 % — ABNORMAL LOW (ref 39.0–52.0)
Hemoglobin: 12.3 g/dL — ABNORMAL LOW (ref 13.0–17.0)
MCH: 30.9 pg (ref 26.0–34.0)
MCHC: 31.9 g/dL (ref 30.0–36.0)
RBC: 3.98 MIL/uL — ABNORMAL LOW (ref 4.22–5.81)

## 2010-11-09 LAB — GLUCOSE, CAPILLARY: Glucose-Capillary: 79 mg/dL (ref 70–99)

## 2010-11-10 ENCOUNTER — Inpatient Hospital Stay (HOSPITAL_COMMUNITY): Payer: Medicare Other

## 2010-11-10 LAB — GLUCOSE, CAPILLARY
Glucose-Capillary: 117 mg/dL — ABNORMAL HIGH (ref 70–99)
Glucose-Capillary: 86 mg/dL (ref 70–99)
Glucose-Capillary: 87 mg/dL (ref 70–99)

## 2010-11-10 LAB — RENAL FUNCTION PANEL
BUN: 26 mg/dL — ABNORMAL HIGH (ref 6–23)
CO2: 32 mEq/L (ref 19–32)
Calcium: 8.6 mg/dL (ref 8.4–10.5)
Chloride: 96 mEq/L (ref 96–112)
Creatinine, Ser: 5.84 mg/dL — ABNORMAL HIGH (ref 0.50–1.35)
Glucose, Bld: 118 mg/dL — ABNORMAL HIGH (ref 70–99)

## 2010-11-10 LAB — CBC
Hemoglobin: 11.3 g/dL — ABNORMAL LOW (ref 13.0–17.0)
MCH: 30.7 pg (ref 26.0–34.0)
Platelets: 182 10*3/uL (ref 150–400)
RBC: 3.68 MIL/uL — ABNORMAL LOW (ref 4.22–5.81)
WBC: 6.6 10*3/uL (ref 4.0–10.5)

## 2010-11-10 LAB — HEMOGLOBIN A1C
Hgb A1c MFr Bld: 4.9 % (ref <5.7)
Mean Plasma Glucose: 94 mg/dL (ref <117)

## 2010-11-11 LAB — CBC
HCT: 33.7 % — ABNORMAL LOW (ref 39.0–52.0)
Hemoglobin: 10.4 g/dL — ABNORMAL LOW (ref 13.0–17.0)
MCH: 30.6 pg (ref 26.0–34.0)
MCHC: 30.9 g/dL (ref 30.0–36.0)
MCV: 99.1 fL (ref 78.0–100.0)
Platelets: 159 10*3/uL (ref 150–400)
RBC: 3.4 MIL/uL — ABNORMAL LOW (ref 4.22–5.81)
RDW: 21.9 % — ABNORMAL HIGH (ref 11.5–15.5)
WBC: 6.4 10*3/uL (ref 4.0–10.5)

## 2010-11-11 LAB — BASIC METABOLIC PANEL
BUN: 12 mg/dL (ref 6–23)
CO2: 33 mEq/L — ABNORMAL HIGH (ref 19–32)
Calcium: 8.6 mg/dL (ref 8.4–10.5)
Chloride: 99 mEq/L (ref 96–112)
Creatinine, Ser: 4.04 mg/dL — ABNORMAL HIGH (ref 0.50–1.35)
GFR calc Af Amer: 19 mL/min — ABNORMAL LOW (ref >60)
GFR calc non Af Amer: 16 mL/min — ABNORMAL LOW (ref >60)
Glucose, Bld: 84 mg/dL (ref 70–99)
Potassium: 4.2 mEq/L (ref 3.5–5.1)
Sodium: 139 mEq/L (ref 135–145)

## 2010-11-11 LAB — GLUCOSE, CAPILLARY: Glucose-Capillary: 89 mg/dL (ref 70–99)

## 2010-11-12 ENCOUNTER — Inpatient Hospital Stay (HOSPITAL_COMMUNITY): Payer: Medicare Other

## 2010-11-12 LAB — RENAL FUNCTION PANEL
CO2: 29 mEq/L (ref 19–32)
Calcium: 8.9 mg/dL (ref 8.4–10.5)
GFR calc Af Amer: 11 mL/min — ABNORMAL LOW (ref >60)
GFR calc non Af Amer: 9 mL/min — ABNORMAL LOW (ref >60)
Phosphorus: 5.4 mg/dL — ABNORMAL HIGH (ref 2.3–4.6)
Sodium: 138 mEq/L (ref 135–145)

## 2010-11-12 LAB — CBC
MCH: 31 pg (ref 26.0–34.0)
MCHC: 31.8 g/dL (ref 30.0–36.0)
Platelets: 183 10*3/uL (ref 150–400)
RBC: 3.29 MIL/uL — ABNORMAL LOW (ref 4.22–5.81)

## 2010-11-14 ENCOUNTER — Inpatient Hospital Stay (HOSPITAL_COMMUNITY): Payer: Medicare Other

## 2010-11-14 LAB — CBC
MCH: 30.2 pg (ref 26.0–34.0)
MCHC: 31.9 g/dL (ref 30.0–36.0)
Platelets: 227 10*3/uL (ref 150–400)
RDW: 20.7 % — ABNORMAL HIGH (ref 11.5–15.5)

## 2010-11-14 LAB — RENAL FUNCTION PANEL
Calcium: 9.3 mg/dL (ref 8.4–10.5)
GFR calc Af Amer: 10 mL/min — ABNORMAL LOW (ref >60)
GFR calc non Af Amer: 8 mL/min — ABNORMAL LOW (ref >60)
Phosphorus: 3.3 mg/dL (ref 2.3–4.6)
Sodium: 136 mEq/L (ref 135–145)

## 2010-11-16 ENCOUNTER — Inpatient Hospital Stay (HOSPITAL_COMMUNITY): Payer: Medicare Other

## 2010-11-16 LAB — CBC
Hemoglobin: 10.4 g/dL — ABNORMAL LOW (ref 13.0–17.0)
MCH: 29.6 pg (ref 26.0–34.0)
MCHC: 31.7 g/dL (ref 30.0–36.0)
Platelets: 245 10*3/uL (ref 150–400)

## 2010-11-16 LAB — RENAL FUNCTION PANEL
Albumin: 2.4 g/dL — ABNORMAL LOW (ref 3.5–5.2)
Chloride: 95 mEq/L — ABNORMAL LOW (ref 96–112)
Creatinine, Ser: 7.29 mg/dL — ABNORMAL HIGH (ref 0.50–1.35)
GFR calc non Af Amer: 8 mL/min — ABNORMAL LOW (ref >60)
Potassium: 4.2 mEq/L (ref 3.5–5.1)

## 2010-11-19 NOTE — Consult Note (Signed)
[name], [name] NO.:  [id number]  MEDICAL RECORD NO.:  [id number]  LOCATION:                                 FACILITY:  PHYSICIAN:  Fransisco Hertz, MD       DATE OF BIRTH:  1960/07/30  DATE OF CONSULTATION:  10/09/2010 DATE OF DISCHARGE:                                CONSULTATION   REQUESTED BY:  Thad Ranger, MD.  REASON FOR CONSULTATION:  Right foot gangrene.  HISTORY OF PRESENT ILLNESS:  This is a 50 year old gentleman known to our service with known peripheral arterial disease, had also known gangrene in the right foot previously underwent a second toe amputation that was done on August 16, 2010.  Since then, the patient has been developed drainage and nonhealing at this wound base of that second toe amputation and then started developing fever and chills, pain in the right foot.  By report at home, temperatures as high as 102 with rigor. The patient also has a known history of previous immunosuppression for a combined kidney-pancreas transplant, was seen recently by his PCM about a week ago and given azithromycin for presumed upper respiratory infection.  The patient notes at this point he is ready to proceed forward with the below-the-knee amputation, which previously has been recommended to proceed forward with.  PAST MEDICAL HISTORY:  Includes, end-stage renal disease, requiring hemodialysis, history of renal and pancreas combined transplant, hypothyroidism, history of intracranial hemorrhage, hyperlipidemia, history of ventricular tachycardia, requiring treatments with defibrillator placement, history of squamous cell cancer on his third finger on his left hand, anemia of chronic disease, and nonobstructive coronary artery disease.  Gastroesophageal reflux disease.  History of third-degree heart block, atrial fibrillation, history of cardiac arrest.  PAST SURGICAL HISTORY:  The right second toe amputation, renal and pancreatic transplant,  permanent pacemaker placement, multiple toe and finger amputation, some type of sternotomy, the exact reason I cannot see in his chart.  Two failed kidney transplant and also a left thigh arteriovenous graft.  SOCIAL HISTORY:  Denies any tobacco, alcohol, or illicit drug use currently.  FAMILY HISTORY:  No prior history of premature cardiovascular disease.  REVIEW OF SYSTEMS:  As noted above, otherwise noted to be negative.  PHYSICAL EXAMINATION:  VITAL SIGNS:  Temperature on evaluation was 99.1, blood pressure 126/72, heart rate of 85, respirations 20, satting 96%. GENERAL:  Somewhat cachectic, well developed, in no apparent distress. HEAD:  Normocephalic, atraumatic. ENT:  Oropharynx without any erythema or exudate.  Nares without any drainage.  Hearing was grossly intact. NECK:  Supple neck.  No nuchal rigidity.  No palpable cervical lymphadenopathy. EYES:  Pupils were equal, round, and reactive to light.  Extraocular movements were intact. PULMONARY:  Symmetric expansion.  Good air movement.  No rales, rhonchi, or wheezes. CARDIAC:  Regular rate and rhythm.  Normal S1-S2.  No murmurs, rubs, thrills, or gallops. VASCULAR:  He had easily palpable femoral popliteal pulses at both sides.  I did not feel any pedal pulses.  His wrists had easily palpable radial and brachial pulses.  Carotids were palpable.  No obvious bruits. MUSCULOSKELETAL:  Motor strength was 5/5, except the right foot  which was painful to testing.  On examination of the right foot, there is dry eschar consistent with dry gangrene on medial surface of the first metatarsal and also on the fifth metatarsal laterally.  The second toe amputation site demonstrates frank purulent drainage and necrotic tissue at the toe base.  There is an odor to this foot and there is also cellulitis creeping up the dorsum of the foot. NEURO:  Cranial nerves II-XII were grossly intact.  Motor strength was as listed above.  Sensation  grossly intact in all extremities.  LABORATORY STUDIES:  Chemistry:  Sodium 136, potassium 3.8 chloride 93, bicarb 31, glucose is 87, BUN 16, creatinine 4.03.  CBC:  White count is 8.9, H and H are 9.5 and 28.9, platelet count of 243.  MEDICAL DECISION MAKING:  This is a 50 year old gentleman with what I think is now wet gangrene in the right foot with concomitant cellulitis given that he has had progression of ischemia in his right foot.  I do not think more conservative measures are recommended at this point.  The patient is amenable at this point to proceeding with a right below-the- knee amputation, which I recommend as a potential life saving measure. He needs to be suppressed with wide spectrum antibiotics including Zosyn and vancomycin.  We will need to try to arrange for his below-the-knee amputation either today or tomorrow.  We will have to see which day I can get him on the schedule to sooner the better for infection control. Otherwise, I will discuss this plan with Dr. Edilia Bo and Dr. Darrick Penna both who had previously seen this patient, and hopefully will be able to get this pain care of by today.     Fransisco Hertz, MD BLC/MEDQ  D:  10/09/2010  T:  10/09/2010  Job:  [id number]  Electronically Signed by Leonides Sake MD on 11/19/2010 12:34:46 PM

## 2010-11-20 ENCOUNTER — Inpatient Hospital Stay: Payer: Medicare Other | Admitting: Physical Medicine & Rehabilitation

## 2010-11-22 LAB — RENAL FUNCTION PANEL
Albumin: 2.6 — ABNORMAL LOW
Calcium: 9.3
Chloride: 100
GFR calc Af Amer: 12 — ABNORMAL LOW
GFR calc non Af Amer: 10 — ABNORMAL LOW
Glucose, Bld: 76
Phosphorus: 4.6
Phosphorus: 5 — ABNORMAL HIGH
Potassium: 4.1
Sodium: 139

## 2010-11-22 LAB — CBC
HCT: 24.2 — ABNORMAL LOW
Hemoglobin: 8.5 — ABNORMAL LOW
MCHC: 33.3
MCHC: 33.9
MCHC: 34.1
MCV: 93.5
MCV: 93.8
Platelets: 192
Platelets: 211
Platelets: 215
RBC: 2.72 — ABNORMAL LOW
RDW: 20 — ABNORMAL HIGH
RDW: 20.2 — ABNORMAL HIGH
RDW: 20.6 — ABNORMAL HIGH
RDW: 20.9 — ABNORMAL HIGH
WBC: 6.6
WBC: 6.7

## 2010-11-22 LAB — DIFFERENTIAL
Lymphs Abs: 0.5 — ABNORMAL LOW
Monocytes Absolute: 0.8
Monocytes Relative: 12
Neutro Abs: 4.3
Neutrophils Relative %: 64

## 2010-11-22 LAB — COMPREHENSIVE METABOLIC PANEL
AST: 17
Albumin: 3.1 — ABNORMAL LOW
Alkaline Phosphatase: 158 — ABNORMAL HIGH
BUN: 29 — ABNORMAL HIGH
CO2: 28
Calcium: 9.1
Calcium: 9.1
Creatinine, Ser: 5.81 — ABNORMAL HIGH
GFR calc Af Amer: 13 — ABNORMAL LOW
GFR calc non Af Amer: 10 — ABNORMAL LOW
Glucose, Bld: 80
Potassium: 4.5
Sodium: 136
Sodium: 139
Total Protein: 6.2
Total Protein: 6.7

## 2010-11-22 LAB — GIARDIA/CRYPTOSPORIDIUM SCREEN(EIA): Giardia Screen - EIA: NEGATIVE

## 2010-11-22 LAB — URINALYSIS, ROUTINE W REFLEX MICROSCOPIC
Hgb urine dipstick: NEGATIVE
Ketones, ur: NEGATIVE
Protein, ur: >300 — AB
Urobilinogen, UA: 0.2

## 2010-11-22 LAB — URINE MICROSCOPIC-ADD ON

## 2010-11-22 LAB — STOOL CULTURE

## 2010-11-22 LAB — CARDIAC PANEL(CRET KIN+CKTOT+MB+TROPI)
Relative Index: INVALID
Total CK: 44
Troponin I: 0.05

## 2010-11-22 LAB — URINE CULTURE
Colony Count: NO GROWTH
Culture: NO GROWTH

## 2010-11-22 LAB — TROPONIN I: Troponin I: 0.05

## 2010-11-22 LAB — CK TOTAL AND CKMB (NOT AT ARMC)
CK, MB: 2.1
Relative Index: INVALID
Total CK: 54

## 2010-11-22 LAB — CLOSTRIDIUM DIFFICILE EIA

## 2010-11-22 LAB — TSH: TSH: 0.713

## 2010-11-22 LAB — PROTIME-INR: INR: 1.1

## 2010-11-22 LAB — APTT: aPTT: 34

## 2010-11-22 NOTE — Discharge Summary (Signed)
[name], [name]:  [id number]  MEDICAL RECORD NO.:  [id number]  LOCATION:  4031                         FACILITY:  [facility]  PHYSICIAN:  Ranelle Oyster, M.D.DATE OF BIRTH:  [birth year]  DATE OF ADMISSION:  10/15/2010 DATE OF DISCHARGE:  10/25/2010                              DISCHARGE SUMMARY   DISCHARGE DIAGNOSIS: 1. 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 2. 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 3. 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 4. Seizure disorder. 5. History of ventricular tachycardia with implantable cardioverter-     defibrillator. 6. Glaucoma.  The patient is legally blind. 7. Anemia of chronic disease.  HISTORY OF PRESENT ILLNESS:  [name] is a 50 year old male with history of end-stage renal disease, peripheral vascular disease with nonhealing ulcer on the right foot with poorly healing wound and foul smelling drainage between great and third toe with dry gangrene mid lateral foot.  He was started on IV antibiotics past admission.  X-rays done showed recurrent osteomyelitis distal limb site of second proximal phalanx.  He was evaluated by Dr. Myra Gianotti and underwent right BKA on October 09, 2010.  [employer] was consulted for preprosthetic education.  IV antibiotics discontinued on October 13, 2010.  The patient's wound is healing, however, he is noted to have 2 areas of blistering on incision, and Dr. Lala Lund recommended keeping the patient on Keflex for now.  The patient was noted to have some mental status changes in hemodialysis on October 11, 2010, and EEG done showed mild generalized slowing.  No seizure activity.  Therapies initiated and the patient is noted to have problems with pain and poor activity tolerance.  He was evaluated by rehab and we felt that he would benefit from a CIR program.  PAST MEDICAL HISTORY:  Positive for intracranial hemorrhage,  seizure disorder, dyslipidemia, ventricular tachycardia with ICD, hypothyroidism, asymptomatic gallstones, legally blind, glaucoma, peptic ulcer disease, end-stage renal disease on hemodialysis.  Renal and pancreatic transplant in 2000, with failed renal transplant.  Excision of squamous cell carcinoma, left hand, history of hemopericardium with sternotomy for treatment, DM type 1, anemia of chronic disease, multiple AV grafts, and transmetatarsal amputation of left foot.  ALLERGIES:  To protamine, hydromorphone, Avelox, Cipro, and ceftriaxone.  FAMILY HISTORY:  Positive for coronary artery disease and hypertension.  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  Does not use any tobacco or alcohol.  Lives in 1-level home with 7 steps at entry.  Family working on getting a ramp build.  Wife works first shift.  PHYSICAL EXAMINATION:  VITAL SIGNS:  His blood pressure is 141/60, pulse 79, respiratory rate 18, temperature 99.0. GENERAL:  The patient is a well-nourished, well-developed male, lying in bed, in some distress. HEENT:  Pupils equal, round, and reactive to light.  Oral mucosa is moist with fair dentition. NECK:  Supple without JVD or lymphadenopathy. LUNGS:  Clear to auscultation bilaterally without wheezes, rales, or rhonchi. HEART:  Regular rate rhythm.  Positive for systolic murmur. EXTREMITIES:  Showed no clubbing, cyanosis, or edema.  Right BKA  site with staples in place and 2 fluctuant blisters below incision line, petechial areas above incision line. ABDOMEN:  Soft, nontender with well-healed old abdominal incision noted. NEUROLOGIC: Cranial nerves II-XII notable for legal blindness.  The patient is able to discern general object, colors, and movements. Reflexes are 1+.  Sensation diminished in distal left lower extremity as well as bilateral hands in stocking-glove distribution.   Judgment, orientation, memory are fair.  Mood was flat and sometimes anxious. Upper extremity strength is grossly 4/5 proximal and distal.  Right lower extremity is approximately 2/5 at hip and knee with some pain inhibition.  Left lower extremity is 3/5.  HOSPITAL COURSE:  [name] was admitted to rehab on October 15, 2010, for inpatient therapies to consist of PT, OT at least 3 hours 5 days a week.  Past admission physiatrist rehab RN and therapy team have worked together to provide customized collaborative interdisciplinary care.  The patient was maintained on Keflex throughout his stay for wound prophylaxis.  Hemodialysis was ongoing on Monday, Wednesday, Friday for now.  His Epogen was adjusted to help with his anemia of chronic disease.  His CBC has been monitored along and last check of October 24, 2010, reveals hemoglobin 9.4, hematocrit 28.5, white count 6.7, platelets 356.  Check of lytes from October 24, 2010, revealed sodium 136, potassium 4.5, chloride 94, CO2 26, BUN 41, creatinine 7.20, glucose 94, and phosphorus at 4.7.  The patient's wound has been monitored daily.  He did continue to have issues with blistering alongincision site.  His dressings were changed to oil emulsion as well as 4x4s and Kerlix daily.  He is to follow up with Vascular Surgery regarding duration of antibiotics and for further changes and dressing care.  During the patient's stay in rehab, weekly team conferences were held to monitor the patient's progress, set goals, as well as discuss barriers to discharge.  At admission, the patient was limited by decreased balance, impaired gait, decrease in overall mobility with decreased activity tolerance.  He required min assist for all mobility and transfers.  Gait was limited by decrease in activity tolerance. Physical Therapy has worked with the patient on strengthening and mobility.  The patient was progressed to being at modified independent for  transfers and wheelchair mobility.  He was supervision for ambulating up to 50 feet with rolling walker.  The patient and wife were educated regarding safety for transfers and mobility and further followup Home Health Physical Therapy to continue past discharge.  OT has worked with the patient on self-care tasks.  At admission, the patient required min-to-mod assist for all ADLs.  He made good progress in his therapy and good bathe at shower level with tub bench and dressed with setup with supervision level.  The wife had completed hands on education needed for ADL care and further followup Home Health OT to continue past discharge.  On June 25, 2010 the patient was discharged home.  DISCHARGE MEDICATIONS: 1. Tylenol 325-650 mg p.o. q.6 hours p.r.n. pain. 2. Keflex 250 mg p.o. t.i.d. 3. Robaxin 500 mg p.o. q.6 hours p.r.n. spasms. 4. OxyIR 5 mg 1-2 p.o. q.4 hours p.r.n. moderate-to-severe pain. 5. Azathioprine 50 mg 1-1/2 p.o. per day. 6. Crestor 5 mg p.o. every other day. 7. Dilantin 100 mg 1 p.o. q.8 hours. 8. Colace 100 mg p.o. per day. 9. Equalactin OTC 2 tabs p.o. b.i.d. 10.Fosrenol 1000 mg p.o. t.i.d. with meals. 11.Loperamide 2 mg p.o. b.i.d. 12.Lumigan 0.03% 1 gtt both eyes  at bedtime. 13.Lutein 20 mg p.o. q.a.m. 14.Nephro-Vite 1 p.o. per day. 15.Nizatidine 150 mg q.p.m. 16.Prednisone 5 mg daily. 17.ProAir 2 puffs q.4 hours p.r.n. shortness of breath. 18.Prograf 1 mg 2 caps p.o. b.i.d. 19.Salmon oil 1 capsule b.i.d. 20.Synthroid 200 mcg p.o. per day. 21.Vitamin C 500 mg b.i.d. 22.Zyrtec 10 mg q.p.m. 23.Ambien 5 mg p.o. at bedtime p.r.n. insomnia.  DIET:  Renal diet with 1200 mL of fluid restriction daily.  ACTIVITY LEVEL:  Intermittent supervision.  SPECIAL INSTRUCTIONS:  No strenuous activity.  No driving.  No alcohol. [facility] to provide PT, OT, and RN.  FOLLOWUP:  The patient is to follow up with Dr. Riley Kill on November 20, 2010, at 9:40 a.m. Follow  up with Dr. Modesto Charon for routine check. Follow up with Dr. Hyman Hopes for routine check.  Follow up with Dr. Myra Gianotti in 1-2 weeks.     Delle Reining, P.A.   ______________________________ Ranelle Oyster, M.D.    PL/MEDQ  D:  10/29/2010  T:  10/30/2010  Job:  [id number]  cc:   Modesto Charon V. Charlena Cross, MD Garnetta Buddy, M.D.  Electronically Signed by Osvaldo Shipper. on 11/01/2010 03:57:42 PM Electronically Signed by Faith Rogue M.D. on 11/22/2010 09:53:48 AM

## 2010-11-23 ENCOUNTER — Telehealth: Payer: Self-pay

## 2010-11-23 NOTE — Telephone Encounter (Signed)
Home Care nurse called to report pt's right stump incision has changed since 9/19.  Reports three areas along incision that are about 0.5 cm size of "purple" discoloration, and two areas that show a "small yellow circle w/ pinpoint black center of tissue discoloration".  Voiced concern about dramatic change in 2 days, and requested pt. be evaluated ASAP.  Will contact pt. for appt.

## 2010-11-25 ENCOUNTER — Emergency Department (HOSPITAL_COMMUNITY): Payer: Medicare Other

## 2010-11-25 ENCOUNTER — Inpatient Hospital Stay (HOSPITAL_COMMUNITY)
Admission: EM | Admit: 2010-11-25 | Discharge: 2010-11-29 | DRG: 177 | Disposition: A | Discharge status: Home / Self Care | Payer: Medicare Other | Attending: Internal Medicine | Admitting: Internal Medicine

## 2010-11-25 DIAGNOSIS — I959 Hypotension, unspecified: Secondary | ICD-10-CM | POA: Diagnosis present

## 2010-11-25 DIAGNOSIS — E039 Hypothyroidism, unspecified: Secondary | ICD-10-CM | POA: Diagnosis present

## 2010-11-25 DIAGNOSIS — Z95810 Presence of automatic (implantable) cardiac defibrillator: Secondary | ICD-10-CM

## 2010-11-25 DIAGNOSIS — I4729 Other ventricular tachycardia: Secondary | ICD-10-CM | POA: Diagnosis present

## 2010-11-25 DIAGNOSIS — Z8674 Personal history of sudden cardiac arrest: Secondary | ICD-10-CM

## 2010-11-25 DIAGNOSIS — N186 End stage renal disease: Secondary | ICD-10-CM | POA: Diagnosis present

## 2010-11-25 DIAGNOSIS — D638 Anemia in other chronic diseases classified elsewhere: Secondary | ICD-10-CM | POA: Diagnosis present

## 2010-11-25 DIAGNOSIS — E119 Type 2 diabetes mellitus without complications: Secondary | ICD-10-CM | POA: Diagnosis present

## 2010-11-25 DIAGNOSIS — J90 Pleural effusion, not elsewhere classified: Secondary | ICD-10-CM | POA: Diagnosis present

## 2010-11-25 DIAGNOSIS — J1569 Pneumonia due to other gram-negative bacteria: Principal | ICD-10-CM | POA: Diagnosis present

## 2010-11-25 DIAGNOSIS — Z7982 Long term (current) use of aspirin: Secondary | ICD-10-CM

## 2010-11-25 DIAGNOSIS — I472 Ventricular tachycardia, unspecified: Secondary | ICD-10-CM | POA: Diagnosis present

## 2010-11-25 DIAGNOSIS — I428 Other cardiomyopathies: Secondary | ICD-10-CM | POA: Diagnosis present

## 2010-11-25 DIAGNOSIS — Z9483 Pancreas transplant status: Secondary | ICD-10-CM

## 2010-11-25 DIAGNOSIS — S68118A Complete traumatic metacarpophalangeal amputation of other finger, initial encounter: Secondary | ICD-10-CM

## 2010-11-25 DIAGNOSIS — S78119A Complete traumatic amputation at level between unspecified hip and knee, initial encounter: Secondary | ICD-10-CM

## 2010-11-25 DIAGNOSIS — J156 Pneumonia due to other aerobic Gram-negative bacteria: Principal | ICD-10-CM | POA: Diagnosis present

## 2010-11-25 DIAGNOSIS — N2581 Secondary hyperparathyroidism of renal origin: Secondary | ICD-10-CM | POA: Diagnosis present

## 2010-11-25 DIAGNOSIS — E785 Hyperlipidemia, unspecified: Secondary | ICD-10-CM | POA: Diagnosis present

## 2010-11-25 DIAGNOSIS — S98139A Complete traumatic amputation of one unspecified lesser toe, initial encounter: Secondary | ICD-10-CM

## 2010-11-25 DIAGNOSIS — Z940 Kidney transplant status: Secondary | ICD-10-CM

## 2010-11-25 DIAGNOSIS — Z79899 Other long term (current) drug therapy: Secondary | ICD-10-CM

## 2010-11-25 DIAGNOSIS — R071 Chest pain on breathing: Secondary | ICD-10-CM | POA: Diagnosis present

## 2010-11-25 DIAGNOSIS — I739 Peripheral vascular disease, unspecified: Secondary | ICD-10-CM | POA: Diagnosis present

## 2010-11-25 DIAGNOSIS — I120 Hypertensive chronic kidney disease with stage 5 chronic kidney disease or end stage renal disease: Secondary | ICD-10-CM | POA: Diagnosis present

## 2010-11-25 DIAGNOSIS — H409 Unspecified glaucoma: Secondary | ICD-10-CM | POA: Diagnosis present

## 2010-11-25 DIAGNOSIS — G40909 Epilepsy, unspecified, not intractable, without status epilepticus: Secondary | ICD-10-CM | POA: Diagnosis present

## 2010-11-25 DIAGNOSIS — H548 Legal blindness, as defined in USA: Secondary | ICD-10-CM | POA: Diagnosis present

## 2010-11-25 LAB — CBC
MCHC: 31.7 g/dL (ref 30.0–36.0)
Platelets: 345 10*3/uL (ref 150–400)
RDW: 20.9 % — ABNORMAL HIGH (ref 11.5–15.5)

## 2010-11-25 LAB — POCT I-STAT, CHEM 8
BUN: 40 mg/dL — ABNORMAL HIGH (ref 6–23)
Calcium, Ion: 1.13 mmol/L (ref 1.12–1.32)
Chloride: 100 meq/L (ref 96–112)
Creatinine, Ser: 8.3 mg/dL — ABNORMAL HIGH (ref 0.50–1.35)
Glucose, Bld: 92 mg/dL (ref 70–99)
HCT: 37 % — ABNORMAL LOW (ref 39.0–52.0)
Hemoglobin: 12.6 g/dL — ABNORMAL LOW (ref 13.0–17.0)
Potassium: 4.2 meq/L (ref 3.5–5.1)
Sodium: 139 mEq/L (ref 135–145)
TCO2: 27 mmol/L (ref 0–100)

## 2010-11-25 LAB — DIFFERENTIAL
Basophils Absolute: 0.1 10*3/uL (ref 0.0–0.1)
Basophils Relative: 1 % (ref 0–1)
Eosinophils Absolute: 0.2 10*3/uL (ref 0.0–0.7)
Eosinophils Relative: 2 % (ref 0–5)
Monocytes Absolute: 0.8 10*3/uL (ref 0.1–1.0)
Neutro Abs: 7.9 10*3/uL — ABNORMAL HIGH (ref 1.7–7.7)

## 2010-11-25 LAB — BASIC METABOLIC PANEL
GFR calc Af Amer: 8 mL/min — ABNORMAL LOW (ref >60)
GFR calc non Af Amer: 7 mL/min — ABNORMAL LOW (ref >60)
Potassium: 4.4 mEq/L (ref 3.5–5.1)
Sodium: 140 mEq/L (ref 135–145)

## 2010-11-26 ENCOUNTER — Ambulatory Visit: Payer: Medicare Other | Admitting: Surgery

## 2010-11-26 ENCOUNTER — Inpatient Hospital Stay (HOSPITAL_COMMUNITY): Payer: Medicare Other

## 2010-11-26 LAB — COMPREHENSIVE METABOLIC PANEL
ALT: 7 U/L (ref 0–53)
AST: 12 U/L (ref 0–37)
Albumin: 2.3 g/dL — ABNORMAL LOW (ref 3.5–5.2)
CO2: 29 mEq/L (ref 19–32)
Calcium: 9.4 mg/dL (ref 8.4–10.5)
Chloride: 99 mEq/L (ref 96–112)
GFR calc non Af Amer: 6 mL/min — ABNORMAL LOW (ref >60)
Sodium: 143 mEq/L (ref 135–145)

## 2010-11-26 LAB — CARDIAC PANEL(CRET KIN+CKTOT+MB+TROPI)
CK, MB: 1.8 ng/mL (ref 0.3–4.0)
Relative Index: INVALID (ref 0.0–2.5)
Total CK: 29 U/L (ref 7–232)
Total CK: 25 U/L (ref 7–232)
Troponin I: <0.3 ng/mL (ref <0.30)
Troponin I: <0.3 ng/mL (ref <0.30)

## 2010-11-26 LAB — PHOSPHORUS: Phosphorus: 4.8 mg/dL — ABNORMAL HIGH (ref 2.3–4.6)

## 2010-11-26 LAB — CBC
Hemoglobin: 11.2 g/dL — ABNORMAL LOW (ref 13.0–17.0)
MCH: 29.9 pg (ref 26.0–34.0)
MCV: 95.5 fL (ref 78.0–100.0)
RBC: 3.75 MIL/uL — ABNORMAL LOW (ref 4.22–5.81)
WBC: 8.4 10*3/uL (ref 4.0–10.5)

## 2010-11-26 LAB — POCT I-STAT TROPONIN I: Troponin i, poc: 0.25 ng/mL (ref 0.00–0.08)

## 2010-11-26 LAB — TSH: TSH: 32.086 u[IU]/mL — ABNORMAL HIGH (ref 0.350–4.500)

## 2010-11-27 ENCOUNTER — Inpatient Hospital Stay (HOSPITAL_COMMUNITY): Payer: Medicare Other

## 2010-11-27 LAB — CBC
HCT: 35.8 % — ABNORMAL LOW (ref 39.0–52.0)
MCH: 30.9 pg (ref 26.0–34.0)
MCHC: 32.4 g/dL (ref 30.0–36.0)
MCV: 95.5 fL (ref 78.0–100.0)
Platelets: 335 10*3/uL (ref 150–400)
RDW: 21.1 % — ABNORMAL HIGH (ref 11.5–15.5)
WBC: 6.6 10*3/uL (ref 4.0–10.5)

## 2010-11-27 MED ORDER — TECHNETIUM TC 99M TETROFOSMIN IV KIT
10.0000 | PACK | Freq: Once | INTRAVENOUS | Status: AC | PRN
  Administered 2010-11-27: 10 via INTRAVENOUS

## 2010-11-27 MED ORDER — TECHNETIUM TC 99M TETROFOSMIN IV KIT
30.0000 | PACK | Freq: Once | INTRAVENOUS | Status: AC | PRN
  Administered 2010-11-27: 30 via INTRAVENOUS

## 2010-11-27 MED ORDER — TECHNETIUM TO 99M ALBUMIN AGGREGATED
6.0000 | Freq: Once | INTRAVENOUS | Status: AC | PRN
  Administered 2010-11-27: 6 via INTRAVENOUS

## 2010-11-27 MED ORDER — XENON XE 133 GAS
10.0000 | GAS_FOR_INHALATION | Freq: Once | RESPIRATORY_TRACT | Status: AC | PRN
  Administered 2010-11-27: 10 via RESPIRATORY_TRACT

## 2010-11-27 NOTE — Discharge Summary (Signed)
[name], [name] NO.:  [id number]  MEDICAL RECORD NO.:  [id number]  LOCATION:  5503                         FACILITY:  [facility]  PHYSICIAN:  Altha Harm, MDDATE OF BIRTH:  14-Mar-1960  DATE OF ADMISSION:  10/08/2010 DATE OF DISCHARGE:  10/15/2010                              DISCHARGE SUMMARY   DISCHARGE DISPOSITION:  Inpatient rehab.  FINAL DISCHARGE DIAGNOSES: 1. Osteomyelitis, right foot, status post right below-knee amputation. 2. Peripheral vascular disease, status post right below-knee     amputation. 3. Hypothyroidism. 4. Status post transplanted pancreas (the patient does not have     diabetes - pancreas, warts). 5. Anemia of chronic disease. 6. Coronary artery disease. 7. History of intracerebral hemorrhage, on Dilantin for seizure     precautions. 8. End-stage renal disease secondary to failed transplanted kidney,     hemodialysis dependent on Monday, Wednesday, Friday. 9. History of ventricular tachycardia, sinus post, automatic     implantable cardioverter-defibrillator placement. 10.History of squamous cell cancer of the fingernails of left hand. 11.Nonobstructive coronary artery disease.  Discharge medications include the following: 1. Aranesp 100 mcg intravenously on Wednesday at hemodialysis. 2. Docusate 100 mg p.o. daily. 3. Docusate 100 mg p.o. nightly. 4. Nepro one can p.o. t.i.d. 5. Percocet 5/325 one to two tablets p.o. q.6 h. p.r.n. pain. 6. Zemplar 5 mcg intravenously Monday, Wednesday and Friday at 12     o'clock. 7. Ambien 5-10 mg p.o. nightly p.r.n. insomnia. 8. Azathioprine 75 mg p.o. daily. 9. Crestor 5 mg p.o. every other day. 10.Dilantin 100 mg p.o. q.8 h. 11.Equalactin over-the-counter two tablets p.o. b.i.d. 12.Fosrenol 1000 mg p.o. t.i.d. with meals and one with snacks. 13.Loperamide 2 mg p.o. b.i.d. 14.Lumigan 0.03% ophthalmic solution one drop to each eye daily at     bedtime. 15.Lutein 20 mg p.o.  daily. 16.Midodrine 5 mg p.o. daily before dialysis and if systolic blood     pressure less than 100. 17.Nephro-Vite one tablet p.o. daily. 18.Nizatidine 150 mg p.o. daily. 19.Prednisone 5 mg p.o. daily. 20.Albuterol inhaler two puffs inhaled q. 4 h. 21.Prograf 2 mg p.o. b.i.d. 22.Salmon oil 1000 mg p.o. b.i.d. 23.Synthroid 200 mcg p.o. daily. 24.Vitamin C 500 mg p.o. b.i.d. 25.Zyrtec 10 mg p.o. nightly.  DISCONTINUED MEDICATIONS: 1. Cephalexin. 2. Azithromycin.  CONSULTANTS: 1. Dr. Darrick Price, Dr. Edilia Price, and Dr. Myra Price, Vein and Vascular     Surgery. 2. Dr. Arrie Aran, Nephrology.  PROCEDURES:  Status post right below-knee amputation on October 09, 2010.  DIAGNOSTIC STUDIES: 1. Two-view chest x-ray on admission, which shows cardiomegaly with     pulmonary vascular cephalization and no frank edema. 2. Study x-ray of the right foot on admission, which shows mild     irregularity of the distal aspect of the amputated second proximal     phalanx, which could be related to recurrent osteomyelitis.  PRIMARY CARE PHYSICIAN:  Dr. Modesto Price in [place].  PRIMARY NEPHROLOGIST:  [name] Buddy, MD  PRIMARY VASCULAR SURGEON:  [name]. [name] Bo, MD  CHIEF COMPLAINT:  Sent from the Hemodialysis Center for fevers and worsen of the right foot ulcer.  HISTORY OF PRESENT ILLNESS:  Please refer to the H and  P by Dr. Isidoro Price for details of the HPI.  However, in short, this is a 50 year old gentleman with multiple medical problems who has a history of prior dry gangrene of the right second toe, which is amputated on August 03, 2010.  The patient was seen at the Dialysis Center where he was noted to be having fevers and a draining right foot ulcer.  The patient was sent to the [facility] for further attention.  [facility] to call for further evaluation and management. 1. Osteomyelitis and gangrene of the right foot.  The patient's     clinical presentation was that of  osteomyelitis and gangrene.  The     patient was seen by his vascular surgeons who made the decision     that a below-knee amputation was required.  The patient was placed     on vancomycin and Zosyn and underwent a below-knee amputation on     October 09, 2010.  Thus, he was continued on Zosyn and vancomycin up     until October 13, 2010, when it was discontinued by Dr. Darrick Price.  The     patient has good closure of the below-knee amputation.  However, at     this time, there is some blisters at the suture line.  The     patient's residual limb has been evaluated by Dr. Myra Price on this     morning and he is aware of the blisters and he will follow up with     the patient in Inpatient Rehab for further management.  At this     time, the patient without any fever or any chills.  He is without     any elevation of his white blood cell count.  The plan is for the patient to engage in rehab and to obtain a possible prosthesis for further mobility. 1. Hypothyroidism.  The patient was adequately replaced and continued     on his usual dose of Synthroid. 2. Status post pancreatic transplant.  The patient did have diabetes     type 2 prior to his pancreas transplant.  However, since he has had     his pancreas transplanted, he is no longer diabetic and does not     need to be on a diabetic diet.  This is a source of frustration to     the patient is frequently been hospitalized.  There is a resumption     for diabetes and the patient has a blood sugars checked and placed     on the diabetic diet, which would be inappropriate for this     gentleman with a working transplanted pancreas. 3. Anemia of chronic disease associated with his renal disease and the     patient received Aranesp and support via the Nephrology Service. 4. History of intracranial hemorrhage.  The patient showed no signs of     decompensation and he is on Dilantin for seizure precautions.  The     patient had no seizures during  this hospitalization. 5. End-stage renal disease.  The patient is followed by Nephrology and     continued his hemodialysis on Monday, Wednesday, Friday without any     incident. 6. History of ventricular tachycardia with automatic implantable     cardioverter-defibrillator placement.  The patient had some     premature ventricular contractions here in the hospital, but other     than that, he has had no other arrhythmias. 7. Coronary artery disease  is stable and asymptomatic at this time.  PHYSICAL EXAMINATION:  VITAL SIGNS:  At the time of discharge, the patient is stable.  His temperature is 98, blood pressure 141/68, heart rate 79, respiratory rate 18, O2 sats are 96% on room air. GENERAL:  His wife is at the bedside and the patient is alert and oriented x3 and quiet for both in jovial. HEENT:  He is normocephalic and atraumatic.  Pupils are equally round and reactive to light and accommodation.  Extraocular movements are intact.  Oropharynx is moist except erythema or lesions are noted. Examination of trachea is midline.  No masses, no thyromegaly, no JVD, no carotid bruit. RESPIRATORY:  He has a normal respiratory effort, equal excursion bilaterally.  No wheezing or rhonchi noted. CARDIOVASCULAR:  Has got a normal S1 and S2.  No murmurs, rubs or gallops are noted.  PMI is nondisplaced and heaves or thrills on palpation. ABDOMEN:  Obese, soft, nontender, and nondistended.  No masses, hepatosplenomegaly is noted. EXTREMITIES:  The patient does have some blisters just below the suture line of the right residual limb.  Upon palpation, there is some serosanguineous fluid release from the blisters. PSYCHIATRIC:  He is alert and oriented x3.  Good insight and cognition to recent and remote recall. NEUROLOGIC:  He has no focal neurological deficits.  Cranial nerves II through XII are grossly intact.  DIETARY RESTRICTIONS:  The patient has a renal diet and I spoke with  his nephrologist he does quite well as home with his renal diet.  Therefore, I have allowed his wife to bring him food from home, which has been quite comforting to him and this should likely be continued.  Physical restrictions under the care of physical therapy.  The patient is stable for transfer to Inpatient Rehab.     Altha Harm, MD     MAM/MEDQ  D:  10/15/2010  T:  10/15/2010  Job:  [id number]  Electronically Signed by Marthann Schiller MD on 11/27/2010 07:47:30 PM

## 2010-11-28 ENCOUNTER — Inpatient Hospital Stay (HOSPITAL_COMMUNITY): Payer: Medicare Other

## 2010-11-28 LAB — POTASSIUM: Potassium: 3.5 mEq/L (ref 3.5–5.1)

## 2010-11-28 LAB — CBC
HCT: 35.1 % — ABNORMAL LOW (ref 39.0–52.0)
Hemoglobin: 10.9 g/dL — ABNORMAL LOW (ref 13.0–17.0)
MCV: 94.1 fL (ref 78.0–100.0)
RBC: 3.73 MIL/uL — ABNORMAL LOW (ref 4.22–5.81)
WBC: 6.1 10*3/uL (ref 4.0–10.5)

## 2010-11-28 LAB — RENAL FUNCTION PANEL
Albumin: 2.3 g/dL — ABNORMAL LOW (ref 3.5–5.2)
BUN: 32 mg/dL — ABNORMAL HIGH (ref 6–23)
CO2: 28 mEq/L (ref 19–32)
Chloride: 96 mEq/L (ref 96–112)
Creatinine, Ser: 7.19 mg/dL — ABNORMAL HIGH (ref 0.50–1.35)
Glucose, Bld: 93 mg/dL (ref 70–99)
Potassium: 3.7 mEq/L (ref 3.5–5.1)

## 2010-11-28 LAB — HEPARIN LEVEL (UNFRACTIONATED): Heparin Unfractionated: 0.11 IU/mL — ABNORMAL LOW (ref 0.30–0.70)

## 2010-11-28 LAB — MAGNESIUM: Magnesium: 2.4 mg/dL (ref 1.5–2.5)

## 2010-11-28 MED ORDER — IOHEXOL 300 MG/ML  SOLN
100.0000 mL | Freq: Once | INTRAMUSCULAR | Status: AC | PRN
  Administered 2010-11-28: 100 mL via INTRAVENOUS

## 2010-11-29 LAB — CBC
HCT: 37 % — ABNORMAL LOW (ref 39.0–52.0)
HCT: 38 — ABNORMAL LOW
HCT: 46.5
Hemoglobin: 11.4 g/dL — ABNORMAL LOW (ref 13.0–17.0)
Hemoglobin: 12.6 — ABNORMAL LOW
Hemoglobin: 13.1
Hemoglobin: 15.5
MCHC: 33
MCHC: 33.3
MCV: 93.7 fL (ref 78.0–100.0)
Platelets: 371 10*3/uL (ref 150–400)
Platelets: 105 — ABNORMAL LOW
RBC: 3.95 MIL/uL — ABNORMAL LOW (ref 4.22–5.81)
RBC: 3.8 — ABNORMAL LOW
RBC: 4.72
RDW: 16.5 — ABNORMAL HIGH
WBC: 4.6 10*3/uL (ref 4.0–10.5)

## 2010-11-29 LAB — POCT I-STAT 4, (NA,K, GLUC, HGB,HCT)
HCT: 44
Hemoglobin: 15
Operator id: 181601
Potassium: 5.1
Sodium: 141

## 2010-11-29 LAB — URINALYSIS, ROUTINE W REFLEX MICROSCOPIC
Bilirubin Urine: NEGATIVE
Glucose, UA: NEGATIVE
Specific Gravity, Urine: 1.013
Urobilinogen, UA: 0.2

## 2010-11-29 LAB — CULTURE, BLOOD (ROUTINE X 2): Culture: NO GROWTH

## 2010-11-29 LAB — COMPREHENSIVE METABOLIC PANEL
ALT: 20
AST: 22
Alkaline Phosphatase: 136 — ABNORMAL HIGH
BUN: 46 — ABNORMAL HIGH
CO2: 25
CO2: 27
Calcium: 9.3
Calcium: 9.5
Creatinine, Ser: 7.26 — ABNORMAL HIGH
GFR calc Af Amer: 10 — ABNORMAL LOW
GFR calc non Af Amer: 10 — ABNORMAL LOW
GFR calc non Af Amer: 8 — ABNORMAL LOW
Glucose, Bld: 84
Sodium: 141
Sodium: 144
Total Protein: 6.1

## 2010-11-29 LAB — GIARDIA/CRYPTOSPORIDIUM SCREEN(EIA)
Cryptosporidium Screen (EIA): NEGATIVE
Giardia Screen - EIA: NEGATIVE

## 2010-11-29 LAB — PHOSPHORUS: Phosphorus: 4.4

## 2010-11-29 LAB — RENAL FUNCTION PANEL
Albumin: 3.1 — ABNORMAL LOW
Calcium: 8.8
GFR calc Af Amer: 13 — ABNORMAL LOW
GFR calc non Af Amer: 11 — ABNORMAL LOW
Glucose, Bld: 77
Phosphorus: 4.3
Potassium: 4.8
Sodium: 138

## 2010-11-29 LAB — INFLUENZA A & B ANTIBODIES
Influenza A Virus Ab, IgG: 0.78 IV
Influenza A Virus Ab, IgM: 0.55 IV
Influenza B Virus Ab, IgM: 0.69 IV
Influenza B ab, IgG: 1.32 IV

## 2010-11-29 LAB — STOOL CULTURE

## 2010-11-29 LAB — DIFFERENTIAL
Basophils Relative: 0
Eosinophils Absolute: 0.1
Neutrophils Relative %: 91 — ABNORMAL HIGH

## 2010-11-29 LAB — CLOSTRIDIUM DIFFICILE EIA

## 2010-11-29 LAB — URINE MICROSCOPIC-ADD ON

## 2010-11-29 LAB — PHENYTOIN LEVEL, TOTAL: Phenytoin Lvl: <2.5 — ABNORMAL LOW

## 2010-11-29 LAB — LIPASE, BLOOD: Lipase: 22

## 2010-11-30 ENCOUNTER — Encounter: Payer: Self-pay | Admitting: Surgery

## 2010-11-30 ENCOUNTER — Telehealth: Payer: Self-pay | Admitting: Internal Medicine

## 2010-11-30 LAB — RENAL FUNCTION PANEL
BUN: 20
CO2: 26
CO2: 28
Chloride: 100
Chloride: 105
Creatinine, Ser: 5.64 — ABNORMAL HIGH
GFR calc Af Amer: 9 — ABNORMAL LOW
GFR calc non Af Amer: 8 — ABNORMAL LOW
Sodium: 139

## 2010-11-30 LAB — CULTURE, BLOOD (ROUTINE X 2): Culture: NO GROWTH

## 2010-11-30 LAB — GRAM STAIN

## 2010-11-30 LAB — CBC
HCT: 29.4 — ABNORMAL LOW
MCHC: 34.2
MCV: 95.4
MCV: 95.9
MCV: 96.3
Platelets: 128 — ABNORMAL LOW
RBC: 3.06 — ABNORMAL LOW
RBC: 3.15 — ABNORMAL LOW
RBC: 3.38 — ABNORMAL LOW
RDW: 16.4 — ABNORMAL HIGH
WBC: 3.4 — ABNORMAL LOW
WBC: 6.6

## 2010-11-30 LAB — ANAEROBIC CULTURE

## 2010-11-30 LAB — BODY FLUID CULTURE

## 2010-11-30 LAB — BASIC METABOLIC PANEL
BUN: 25 — ABNORMAL HIGH
CO2: 25
Chloride: 100
Creatinine, Ser: 5.95 — ABNORMAL HIGH
Glucose, Bld: 65 — ABNORMAL LOW

## 2010-11-30 LAB — PROTIME-INR: Prothrombin Time: 16.7 — ABNORMAL HIGH

## 2010-11-30 NOTE — Telephone Encounter (Signed)
Patient was D/C from the hospital yesterday due to Peripheral arterial disease right AKA. Patient has some cardiac enzymes blood work while he was in the hospital. He would like to talk to Dr. Ladona Ridgel about it. Patient has an appointment on 01/19/11 he would like a sooner appointment. Pt's call was transferred to Glynda Jaeger for appointment.

## 2010-11-30 NOTE — Telephone Encounter (Signed)
Pt just got out of hospital and wants to see dr Ladona Ridgel only please call

## 2010-12-01 NOTE — Op Note (Signed)
  [name], [name]                ACCOUNT NO.:  [id number]  MEDICAL RECORD NO.:  [id number]  LOCATION:  2550                         FACILITY:  [facility]  PHYSICIAN:  Juleen China IV, MDDATE OF BIRTH:  1960/12/15  DATE OF PROCEDURE:  11/09/2010 DATE OF DISCHARGE:                              OPERATIVE REPORT   PREOPERATIVE DIAGNOSIS:  Nonhealing right below-knee amputation.  POSTOPERATIVE DIAGNOSIS:  Nonhealing right below-knee amputation.  PROCEDURE PERFORMED:  Right above-knee amputation.  SURGEON: 1. Charlena Cross, MD  ASSISTANT:  Newton Pigg, PA  ANESTHESIA:  General.  BLOOD LOSS:  Minimal.  SPECIMENS:  Right leg.  INDICATIONS:  This is a 50 year old gentleman who previously underwent right below-knee amputation for lower extremity ulceration and ischemia. He has necrosed his posterior flap and comes in today for above-knee amputation.  PROCEDURE:  The patient was identified in the holding area and taken to room 6 and placed supine on the table.  General anesthesia was administered.  The patient was prepped and draped in usual fashion. Time-out was called.  Antibiotics were given.  A fishmouth incision was made above the knee.  Cautery was used to divide the subcutaneous tissue.  The muscles were divided with cautery.  Circumferential exposure of the femur was obtained.  Periosteal elevator was used to elevate the periosteum.  A giggly saw was used to transect the bone. The anterior surface was beveled.  Cautery was used to divide the rest of the muscle.  A hemostat was placed on the neurovascular bundle.  The leg was then removed and sent as a specimen.  I then dissected out the artery, nerve, and vein and individually ligated these proximal to the cut edge of the bone.  Next the stump was copiously irrigated.  Hemostasis was achieved. The fascia was then reapproximated with interrupted 2-0 Vicryl.  Skin was closed with staples.  Sterile dressings  were applied.  There were no complications.     [name], MD     VWB/MEDQ  D:  11/09/2010  T:  11/09/2010  Job:  [id number]  Electronically Signed by Arelia Longest IV MD on 12/01/2010 09:57:26 AM

## 2010-12-01 NOTE — Discharge Summary (Signed)
NAMEALYUS, MOFIELD                ACCOUNT NO.:  [id number]  MEDICAL RECORD NO.:  [id number]  LOCATION:  6531                         FACILITY:  [facility]  PHYSICIAN:  Juleen China IV, MDDATE OF BIRTH:  [birth year]  DATE OF ADMISSION:  11/09/2010 DATE OF DISCHARGE:  11/16/2010                              DISCHARGE SUMMARY   ADMISSION DIAGNOSIS:  Nonhealing right below-the-knee amputation.  HISTORY OF PRESENT ILLNESS:  This is a 50 year old gentleman who previously underwent right BKA for lower extremity ulceration and ischemia.  He has necrosis, posterior flap and comes in for above knee amputation.  HOSPITAL COURSE:  The patient is admitted to the hospital and taken to the operating room on November 09, 2010 where he underwent a right above- the-knee amputation.  He tolerated the procedure well and was transported to the recovery room in satisfactory condition.  By postoperative day #2, he was doing well but complaining of pain on his right lower extremity.  He continued his hemodialysis postoperatively. The patient's PCA was discontinued on postoperative day #3.  His postoperative course continued with physical and occupational therapy. On postoperative day #5, he did have a hypoglycemic event with his capillary blood glucose of 28.  He was given D50 IV and this brought his blood glucose to 91.  Postoperative day #6, he did have some erythema around his incision and Dr. Myra Gianotti did feel that this could be resolving hematoma but could not rule out infection and therefore the patient was started on vancomycin.  The patient was discharged home on postoperative day #7.  We spoke with Leonette Most, a renal PA and the patient is to receive vancomycin 500 mg with hemodialysis x2 weeks for possible infection.  DISCHARGE INSTRUCTIONS:  He is discharged home with extensive instructions on wound care.  He is also instructed to continue his physical therapy.  DISCHARGE DIAGNOSES: 1.  Nonhealing right below-knee amputation.     a.     Status post right above-the-knee amputation November 09, 2010. 2. End-stage renal disease on hemodialysis Monday, Wednesday and     Friday. 3. Hypertension. 4. Hypothyroidism. 5. Secondary hyperparathyroidism in the setting of end-stage renal     disease. 6. History of AICD placement. 7. Nonischemic cardiomyopathy. 8. Type 1 diabetes. 9. History of intracranial hemorrhage. 10.History of amputation, left third digit of left hand. 11.Hyperlipidemia. 12.History of hemopericardium. 13.Anemia of chronic disease. 14.Peptic ulcer disease. 15.Cholelithiasis. 16.Erectile dysfunction. 17.Coronary artery disease, nonobstructive disease in November 2005. 18.Peripheral nephropathy, neuropathy due to diabetes. 19.Gastroesophageal reflux disease. 20.Glaucoma. 21.History of squamous cell skin cancer. 22.History of toe amputation. 23.History of methicillin-resistant Staphylococcus aureus.  DISCHARGE MEDICATIONS: 1. Acetaminophen 325 mg 2 tablets q.6 h. P.r.n. 2. Azathioprine 50 mg 1/2 tablet p.o. q.a.m. 3. Crestor 5 mg p.o. every other day. 4. Dilantin 100 mg 1 capsule q.8 h. 5. Colace 100 mg 1 tablet p.o. b.i.d. 6. Equalactin OTC 2 tablets p.o. b.i.d. 7. Fosrenol 1000 mg 1 tablet t.i.d. with meals. 8. Loperamide 2 mg 1 capsule p.o. b.i.d. 9. Lumigan 0.03%, 1 drop both eyes at bedtime. 10.Lutein 20 mg p.o. q.a.m. 11.Methocarbamol 500 mg 1 tablet p.o. q.6 h. P.r.n. 12.Nephro-Vite  1 tablet q.a.m. 13.Nizatidine 150 mg 1 capsule p.o. q.p.m. 14.OxyIR 5 mg 1-2 tablets p.o. q.4 h. p.r.n. pain, #30, no refill. 15.Prednisone 5 mg p.o. q.a.m. 16.Prograf 1 mg 2 capsules p.o. b.i.d. 17.Proventil 2 puffs q.4 h. P.r.n. 18.Synthroid 200 mcg p.o. q.a.m. 19.Salmon oil 1000 mg 1 capsule p.o. b.i.d. 20.Vitamin C 500 mg p.o. b.i.d. 21.Zyrtec 10 mg 1 p.o. at bedtime.  FOLLOWUP:  The patient is to follow up with Dr. Myra Gianotti in 4  weeks.     Newton Pigg, PA   ______________________________ V. Charlena Cross, MD    SE/MEDQ  D:  11/28/2010  T:  11/28/2010  Job:  [id number]  Electronically Signed by Newton Pigg PA on 11/30/2010 09:01:31 AM Electronically Signed by Arelia Longest IV MD on 12/01/2010 09:57:30 AM

## 2010-12-02 LAB — PHENYTOIN LEVEL, FREE AND TOTAL

## 2010-12-02 LAB — CULTURE, BLOOD (ROUTINE X 2): Culture  Setup Time: 201209241147

## 2010-12-02 NOTE — H&P (Signed)
[name], [name] NO.:  [id number]  MEDICAL RECORD NO.:  [id number]  LOCATION:  [facility]                         FACILITY:  [facility]  PHYSICIAN:  Eduard Clos, MDDATE OF BIRTH:  August 17, 1960  DATE OF ADMISSION:  11/25/2010 DATE OF DISCHARGE:                             HISTORY & PHYSICAL   PRIMARY CARE PHYSICIAN:  Modesto Charon in [place].  PRIMARY NEPHROLOGIST:  Garnetta Buddy, MD  PRIMARY VASCULAR SURGEON:  Di Kindle. Edilia Bo, MD  CHIEF COMPLAINT:  Left-sided chest pain.  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  In addition, the patient is also complaining of some cough. Denies any phlegm.  Denies any fever or chills.  Denies any dizziness or any diaphoresis.  The patient had some nausea and vomited once.  Denies any headache, visual symptoms, any focal deficit.  Denies any abdominal pain, dysuria, discharge, diarrhea.  In the ER, the patient had a chest x-ray which was showing features of pneumonia.  The patient has been admitted for further workup.  His point-of-care troponin was positive, but his regular troponin of last blood draw was negative.  PAST MEDICAL HISTORY: 1. History of ESRD, on hemodialysis on Monday, Wednesday, Friday. 2. History of renal transplant and pancreatic transplant. 3. History of diabetes mellitus type 2. 4. History of intracranial bleed. 5. History of hyperlipidemia. 6. History of history of V-tach. 7. ICD placement and pacemaker placement. 8. 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 9. History of anemia of chronic  disease. 10.History of third-degree heart block, status post pacemaker     placement. 11.History of cardiac arrest.  PAST SURGICAL HISTORY: 1. History of second toe amputation. 2. Renal and pancreatic transplant. 3. History of permanent pacemaker placement. 4. History of multiple toe and finger amputations. 5. History of failed renal transplant. 6. Left thigh AV graft placement.  MEDICATIONS PER ADMISSION:  The patient is on: 1. Proventil. 2. Acetaminophen. 3. Fosrenol. 4. Dilantin 100 mg p.o. every 8 hours. 5. Lumigan eyedrops. 6. Nizatidine 150 mg capsule every evening. 7. Zyrtec. 8. Equalactin. 9. Loperamide. 10.Docusate. 11.Salmon oil. 12.Vitamin C. 13.Lutein 20 mg every morning. 14.Synthroid 200 mcg p.o. daily. 15.Prograf 1 mg p.o. 2 capsules twice daily. 16.Nephro-Vite. 17.Crestor 5 mg p.o. daily. 18.Azathioprine 50 mg 1-1/2 tablets daily.  ALLERGIES:  PROTAMINE, HYDROMORPHONE, MOXIFLOXACIN, CIPROFLOXACIN, and CEFTRIAXONE.  SOCIAL HISTORY:  The patient denies smoking cigarette, drinking alcohol, or illegal drugs.  He is married.  FAMILY HISTORY:  Nothing contributory.  REVIEW OF SYSTEMS:  As per history of present illness, nothing else significant.  PHYSICAL EXAMINATION:  GENERAL:  The patient is examined at bedside, not in acute distress. VITAL SIGNS:  Blood pressure is 120/70, pulse 85 per minute, temperature 98.2, respirations 18 per minutes, O2 sat 96%. HEENT:  Anicteric.  No pallor.  No discharge from ears,  eyes, nose, or mouth. CHEST:  Bilateral air entry present.  No rhonchi, no crepitation. HEART:  S1, S2 heard. ABDOMEN:  Soft, nontender.  Bowel sounds heard. CNS:  Alert, awake, and oriented to time, place, and person.  Moves upper and lower extremities, 5/5. EXTREMITIES:  Right lower extremity stump has dressing done.  The dressing does not look wet.  His left lower extremity foot has toe amputations.  There is no edema.  LABORATORY DATA:  EKG  shows paced rhythm, rate is around 86 beats per minute.  Chest x-ray shows patchy left lower lobe and lingular airspace opacity raises concern for pneumonia, mild underlying left-sided vascular congestion and borderline cardiomegaly noted.  CBC:  WBC is 9.5, hemoglobin 12.6, hematocrit 37, platelets 345.  Basic metabolic panel:  Sodium 139, potassium 4.2, chloride 100, carbon dioxide 31, glucose 92, BUN 40, creatinine 8.3, calcium 9.6. CK 13, MB is 1.6, troponin less than 0.3, point-of-care troponin is 0.25.  ASSESSMENT: 1. Possible pneumonia. 2. Pleuritic type of chest pain. 3. History is end-stage renal disease, on hemodialysis on Monday,     Wednesday, and Friday. 4. History of failed renal transplant. 5. History of pancreatic transplant. 6. History of diabetes mellitus type 2. 7. History of hypothyroidism. 8. History of intracranial bleed. 9. History of ventricular tachycardia, cardiac arrest, status post     implantable cardioverter-defibrillator placement. 10.History of third degree heart block, status post pacemaker     placement. 11.History of nonobstructive coronary artery disease.  PLAN: 1. At this time, we will admit the patient to step-down unit as the     patient has had persistent pleuritic type of chest pain. 2. For his possible pneumonia, at this time the patient will be vanc     and Zosyn.  Repeat blood cultures.  The patient does have pleuritic     chest pain, which is probably secondary to pneumonia.  We are going     to cycle cardiac markers.  His point-of-care troponin was positive,     but his regular troponins are negative.  We are going to trend his     cardiac enzymes.  As the patient has recent surgery, we will get a     V/Q scan.  The patient has pancreatic transplant.  He was told not     to have IV contrast. 3. For his dialysis, we will get Nephrology consult.  He usually gets     his dialysis on Monday, Wednesday, and Friday. 4. Further  recommendation based on test order and clinical course.     Eduard Clos, MD     ANK/MEDQ  D:  11/26/2010  T:  11/26/2010  Job:  [id number]  cc:   Victorino December, M.D. Di Kindle. Edilia Bo, M.D.  Electronically Signed by Midge Minium MD on 12/02/2010 12:01:17 PM

## 2010-12-03 ENCOUNTER — Encounter: Payer: Self-pay | Admitting: Surgery

## 2010-12-03 ENCOUNTER — Ambulatory Visit (INDEPENDENT_AMBULATORY_CARE_PROVIDER_SITE_OTHER): Payer: Medicare Other | Admitting: Surgery

## 2010-12-03 VITALS — BP 146/75 | HR 79 | Temp 98.0°F

## 2010-12-03 DIAGNOSIS — L98499 Non-pressure chronic ulcer of skin of other sites with unspecified severity: Secondary | ICD-10-CM

## 2010-12-03 DIAGNOSIS — I739 Peripheral vascular disease, unspecified: Secondary | ICD-10-CM | POA: Insufficient documentation

## 2010-12-03 DIAGNOSIS — Z89619 Acquired absence of unspecified leg above knee: Secondary | ICD-10-CM

## 2010-12-03 DIAGNOSIS — I7025 Atherosclerosis of native arteries of other extremities with ulceration: Secondary | ICD-10-CM | POA: Insufficient documentation

## 2010-12-03 DIAGNOSIS — S78119A Complete traumatic amputation at level between unspecified hip and knee, initial encounter: Secondary | ICD-10-CM

## 2010-12-03 NOTE — Progress Notes (Signed)
The patient is back today for followup. He originally underwent right below-knee amputation for nonhealing wound. Unfortunately, this also did not heal and he required a right above-knee amputation on 11/09/2010 his postoperative course was uncomplicated. He was recently admitted to the hospital for pneumonia. He is here today for followup. He is not having any pain within his stump.  On examination the wound is intact there is a small amount of blistering around the incision on the posterior side in the midline this appears to be a very superficial process. There is no evidence of erythema or infection.  Overall, I feel like his stump looks very good. I will have him come back next week for nurse visit to have his staples removed.

## 2010-12-07 LAB — POCT I-STAT 4, (NA,K, GLUC, HGB,HCT)
Glucose, Bld: 86 mg/dL (ref 70–99)
Glucose, Bld: 85
HCT: 41 % (ref 39.0–52.0)
Hemoglobin: 10.2 — ABNORMAL LOW
Operator id: 181601
Potassium: 4 mEq/L (ref 3.5–5.1)
Potassium: 4
Sodium: 136 mEq/L (ref 135–145)

## 2010-12-09 NOTE — Discharge Summary (Signed)
[name], RAO NO.:  [id number]  MEDICAL RECORD NO.:  [id number]  LOCATION:  5532                         FACILITY:  [facility]  PHYSICIAN:  Peggye Pitt, M.D. DATE OF BIRTH:  Dec 05, 1960  DATE OF ADMISSION:  11/25/2010 DATE OF DISCHARGE:  11/29/2010                              DISCHARGE SUMMARY   PRIMARY CARE PHYSICIAN:  Dr. Modesto Charon is .  NEPHROLOGIST:  Garnetta Buddy, [place].  VASCULAR SURGEONS: 1. Di Kindle. Edilia Bo, MD 2. Juleen China IV, MD  DISCHARGE DIAGNOSES: 1. Gram-negative healthcare-associated pneumonia of the left lower     lobe. 2. Left-sided pleural effusion. 3. Hypertension with episodes of hypotension necessitating being     restarted on midodrine this admission.  The rest of his discharge diagnoses are consistent with his past medical history and include: 1. End-stage renal disease, on hemodialysis Monday, Wednesday, Friday. 2. Peripheral vascular disease, status post recent BKA in August 2012     and AKA in September 2012 of his right lower extremity. 3. History of ventricular tachycardia with several short runs this     admission, which were asymptomatic. 4. History of cardiac arrest and third-degree block.  He is status     post pacemaker and implantable cardioverter-defibrillator. 5. Type 2 diabetes, well controlled. 6. History of renal transplant, that was unsuccessful. 7. History of pancreatic transplant successful on chronic     immunosuppression. 8. History of hyperlipidemia. 9. History of intracranial bleeding. 10.History of an anemia of chronic disease. 11.Glaucoma - the patient is legally blind.  BRIEF HISTORY AND HOSPITAL COURSE:  This is a very pleasant 50 year old male with the history listed above, who had recently been in the hospital for a right lower extremity AKA secondary to a nonhealing below- the-knee amputation wound.  He presented to the emergency  department on November 25, 2010, with complaints of chest pain over the left side for the previous 24 hours.  He was also complaining of cough and denied any phlegm.  Denied any fever, chills, dizziness, or diaphoresis.  In the ED, the patient had a chest x-ray showing features of left lower lobe pneumonia and he was admitted for further workup.  Of note, his point-of- care troponin was positive, but his regular serum troponin was negative.  The patient was started on IV vancomycin and Zosyn at the time of his admission.  These were dosed by pharmacy.  The next morning, he had hemodialysis and immediately felt better after his hemodialysis.  He continued on his antibiotic therapy, feeling better each day.  On November 28, 2010, he had episodes of hypotension and particularly in dialysis he was hypotensive.  Consequently, he was restarted on midodrine. He was able to complete his dialysis and improve his dry weight with the addition of midodrine.  This morning, he appears much improved.  He is eating.  His spirits are good.  His lungs still have some coarse breath sounds and he has a hacking nonproductive cough, but he is stable and ready for discharge home.    With regard to his right lower extremity stump, the dressing is clean and dry, I took if off  this morning.  He has staples in place with some bruising around them; however, the wound area is clean with no signs of swelling, erythema, or drainage.  It looks good and he is scheduled to follow up with Dr. Basilio Cairo on Monday December 03, 2010.    With the regard to his antibiotic therapy for his left lower lobe pneumonia, he will remain on vancomycin and Fortaz to be given in hemodialysis with the last dosage on December 03, 2010, Monday.  However, we will leave the discontinuance of his antibiotics to the discretion of the Renal physicians who see him frequently, they may decide to extend his antibiotic therapy.  PHYSICAL  EXAMINATION AT THE TIME OF DISCHARGE:  GENERAL:  The patient is alert and oriented, sitting up in the chair.  He appears well. VITAL SIGNS:  Temperature 98.3, pulse 79, respirations 20, blood pressure 114/68, O2 sats 96% on room air. HEENT:  Head is atraumatic and normocephalic.  Eyes are anicteric.  Nose shows no nasal discharge or exterior lesions.  Mouth has moist mucous membranes and moderate dentition. NECK:  Supple with midline trachea.  No JVD.  No lymphadenopathy. CHEST:  Demonstrates no accessory muscle use; however, he does have somewhat coarse breath sounds and a very dry nonproductive cough.  He has no wheezes or crackles. ABDOMEN:  Slightly distended.  He has hernia, it is nonreducible and nontender.  He has good bowel sounds. HEART:  Has a regular rate and rhythm.  He has had short episodes of V- tach on tele this admission and does have a systolic murmur that sounds as though, it is aortic stenosis. EXTREMITIES:  Show no clubbing, cyanosis, or edema.  He has a right lower AKA.  As mentioned, the wound looks good. PSYCHIATRIC:  The patient is alert and oriented.  Demeanor is pleasant and cooperative.  Grooming is good.  PERTINENT LABS THIS ADMISSION:  CBC today shows a white count of 4.6, hemoglobin 11.4, hematocrit 37.0, platelets are 371.  His last BMET was on November 28, 2010; sodium 137, potassium 3.7, chloride 96, bicarb 28, glucose 93, BUN 32, creatinine 7.19.  He did have a thyroid panel drawn that showed originally he had a TSH of 32.086.  He is on thyroid medication.  Followup thyroid panel showed a TSH of 23.860, free T4 1.12, T4 6.3, free T3 1.2, and T3 33.8.  Consequently, it was felt that the patient had sick euthyroid syndrome and his Synthroid was not adjusted.  Blood cultures were drawn on admission and today, they are still in preliminary status, but show no growth.  RADIOLOGICAL EXAMINATION:  The patient had a two-view of his chest on admission that  showed patchy left lower lobe lingular airspace opacification concerning for pneumonia.  He had a V/Q scan done on November 27, 2010, that showed triple-matched large defect in the left lower lobe with airspace opacity in this vicinity, this signified an intermediate probability of PE.  Consequently, the V/Q scan was followed up with a CT of chest on November 28, 2010, which showed no evidence of central PE, small-to-moderate left-sided pleural effusion with partial consolidation in the left lower lobe, diffuse hazier opacities within both lungs, more prominent on the left, this may reflect mild interstitial edema; however, underlying pneumonia cannot be excluded. Also showed chronic occlusion of the venous vasculature in the mediastinum with numerous collaterals and marked degenerative change along the lower C-spine with chronic compression deformity of C6.  DISCHARGE MEDICATIONS: 1. Aspirin 325 mg  one tablet daily. 2. Elita Quick 1 g IV given every Monday, Wednesday, Friday and dialysis     take for more days, his last dose will be on December 03, 2010. 3. Guaifenesin 600 mg one tablet by mouth twice daily. 4. Midodrine 5 mg one tablet by mouth twice daily, take for 30 days. 5. Vancomycin 500 mg IV, to be given in hemodialysis for four more     days, last dose on December 03, 2010. 6. Acetaminophen 325 mg two tablets by mouth every 6 hours as needed. 7. Azathioprine 50 mg one and half tablets by mouth every morning. 8. Crestor 5 mg one tablet by mouth every other day. 9. Dilantin 100 mg one capsule by mouth every 8 hours. 10.Docusate sodium 100 mg one capsule by mouth twice daily. 11.Equalactin OTC two tablets by mouth twice daily. 12.Fosrenol 100/1000 mg one tablet by mouth three times a day with     meals. 13.Loperamide 2 mg half capsule by mouth twice daily p.r.n. 14.Lumigan 0.03% ophthalmic solution one drop in both eyes daily at     bedtime. 15.Luetin 20 mg one capsule by mouth every  morning. 16.Nephro-Vite renal vitamin one tablet by mouth every morning. 17.Nizatidine 150 mg one capsule by mouth every evening. 18.Prograf 1 mg two capsules by mouth twice daily. 19.Proventil albuterol inhaler two puffs inhaled every 4 hours as     needed. 20.Synthroid 200 mcg one tablet by mouth every morning. 21.Salmon oil 1000 mg over-the-counter one tablet by mouth twice     daily. 22.Vitamin C 500 mg one tablet by mouth twice daily. 23.Zyrtec 10 mg over-the-counter one tablet by mouth every morning.  DISCHARGE INSTRUCTIONS:  The patient will be discharged to home. Activity level will be as tolerated.  Diet is renal diet.  FOLLOWUP APPOINTMENTS:  He is to be seen in dialysis tomorrow, his usual date, time and location.  He is also to follow up with Dr. Durene Cal about his right lower extremity AKA on Monday morning, December 03, 2010.     Stephani Police, PA   ______________________________ Peggye Pitt, M.D.    MLY/MEDQ  D:  11/29/2010  T:  11/29/2010  Job:  [id number]  cc:   Victorino December, M.D. Di Kindle. Edilia Bo, M.D. [name], MD  Electronically Signed by Algis Downs PA on 12/05/2010 04:51:00 PM Electronically Signed by Peggye Pitt M.D. on 12/09/2010 08:05:49 AM

## 2010-12-10 ENCOUNTER — Ambulatory Visit (INDEPENDENT_AMBULATORY_CARE_PROVIDER_SITE_OTHER): Payer: Medicare Other | Admitting: *Deleted

## 2010-12-10 DIAGNOSIS — I70219 Atherosclerosis of native arteries of extremities with intermittent claudication, unspecified extremity: Secondary | ICD-10-CM

## 2010-12-10 DIAGNOSIS — I70269 Atherosclerosis of native arteries of extremities with gangrene, unspecified extremity: Secondary | ICD-10-CM

## 2010-12-10 NOTE — Progress Notes (Signed)
Patient came in today for staple removal on Right AKA as per orders on 12-03-10 by Dr. Wyvonnia Dusky were removed without difficulty, pt has about the same amount of scabbing. Wound was steristripped per [facility] verbal order.  Pt instructed on cleaning wounds and he voiced understanding. He will contact [employer] for initial evaluation for prosthetic even though [facility] told him it would be a couple of weeks of healing prior to placement of stump shrinker. Again, pt voiced understanding. He will return in 1 month for follow up.

## 2010-12-13 ENCOUNTER — Ambulatory Visit (INDEPENDENT_AMBULATORY_CARE_PROVIDER_SITE_OTHER): Payer: Medicare Other | Admitting: Internal Medicine

## 2010-12-13 ENCOUNTER — Encounter: Payer: Self-pay | Admitting: Internal Medicine

## 2010-12-13 DIAGNOSIS — I428 Other cardiomyopathies: Secondary | ICD-10-CM

## 2010-12-13 DIAGNOSIS — Z95810 Presence of automatic (implantable) cardiac defibrillator: Secondary | ICD-10-CM

## 2010-12-13 DIAGNOSIS — R0989 Other specified symptoms and signs involving the circulatory and respiratory systems: Secondary | ICD-10-CM

## 2010-12-13 DIAGNOSIS — I5022 Chronic systolic (congestive) heart failure: Secondary | ICD-10-CM

## 2010-12-13 NOTE — Patient Instructions (Signed)
Your physician wants you to follow-up in: 12 months with Dr Taylor You will receive a reminder letter in the mail two months in advance. If you don't receive a letter, please call our office to schedule the follow-up appointment.  Remote monitoring is used to monitor your Pacemaker of ICD from home. This monitoring reduces the number of office visits required to check your device to one time per year. It allows us to keep an eye on the functioning of your device to ensure it is working properly. You are scheduled for a device check from home on 03/14/2011 You may send your transmission at any time that day. If you have a wireless device, the transmission will be sent automatically. After your physician reviews your transmission, you will receive a postcard with your next transmission date.    

## 2010-12-15 ENCOUNTER — Encounter: Payer: Self-pay | Admitting: Internal Medicine

## 2010-12-15 NOTE — Progress Notes (Signed)
HPI [name] returns today for followup. He is a pleasant 50 yo man with an extensive past medical history. He has an ischemic CM, chronic systolic heart failure, ESRD on HD, and VT. He is s/p ICD implant. He recently underwent right AKA after developing gangrene. He has been bothered by hypotension, particularly during dialysis. Allergies  Allergen Reactions  . Avelox (Moxifloxacin Hcl In Nacl)     Ask patient to clarify type of medication. Also need to know type/severity/reaction.  . Ceftriaxone Sodium Nausea And Vomiting  . Protamine     Significant hypotension?  . Rocephin (Ceftriaxone Sodium In Dextrose)     Ask patient to clarify type of medication. Also need to know type/severity/reaction.  . Ciprofloxacin Rash  . Moxifloxacin Rash     Current Outpatient Prescriptions  Medication Sig Dispense Refill  . aspirin 325 MG tablet Take 325 mg by mouth daily.        [name] azaTHIOprine (IMURAN) 50 MG tablet Take 1 and a half tablets once daily.       [name] azithromycin (ZITHROMAX) 250 MG tablet Take 250 mg by mouth daily.        . B Complex-C-Folic Acid (B COMPLEX-VITAMIN C-FOLIC ACID) 1 MG tablet Take 1 tablet by mouth daily.        . bimatoprost (LUMIGAN) 0.03 % ophthalmic drops 1 gtt once daily       . Calcium Polycarbophil (EQUALACTIN PO) Take 2 tablets by mouth 2 (two) times daily.        . cetirizine (ZYRTEC) 10 MG tablet Take 10 mg by mouth daily.        [name] guaiFENesin (MUCINEX) 600 MG 12 hr tablet Take 1,200 mg by mouth 2 (two) times daily.        Maximino Greenland IN Inhale 1-2 Inhalers into the lungs. Take every 4-6 hrs       . lanthanum (FOSRENOL) 1000 MG chewable tablet 1 tab four times a day       . levothyroxine (SYNTHROID, LEVOTHROID) 200 MCG tablet Take 200 mcg by mouth daily.        . Lutein 20 MG TABS Take 1 tablet by mouth daily.        . midodrine (PROAMATINE) 5 MG tablet Take one by mouth before dialysis and if BP < 100       . nizatidine (AXID) 150 MG capsule Take  150 mg by mouth daily.        . Omega-3 Fatty Acids (SALMON OIL) 200 MG CAPS Take 1 cap twice a day      . phenytoin (DILANTIN) 100 MG ER capsule Take by mouth 3 (three) times daily.        . predniSONE (DELTASONE) 5 MG tablet Take 5 mg by mouth daily.        . rosuvastatin (CRESTOR) 5 MG tablet Take one tablet by mouth every other day.       . tacrolimus (PROGRAF) 1 MG capsule 2 capsules, two times a day       . vitamin C (ASCORBIC ACID) 500 MG tablet Take 500 mg by mouth 2 (two) times daily.           Past Medical History  Diagnosis Date  . Secondary hyperparathyroidism, renal     In setting of ESRD.  [name] Hypothyroidism   . Automatic implantable cardiac defibrillator in situ     History of implanted dual-chamber ICD in 01/2004, with removal and insertion of a Bi-V ICD in  08/2009 by Dr. 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 Hypertension   . Nonischemic cardiomyopathy     2D-echocardiogram (08/2007) - LV EF 45%, akinesis of inferoseptal wall, inferior wall,   . ESRD on hemodialysis Started age 14yo    on MWF schedule. Previous failed renal transplant 1998 at [facility] (with rejection in 1998). 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 Resumed HD 10/2006.   [name] DM type 1 (diabetes mellitus, type 1) DX: age 77yo    previously well controlled with Aic 5 (12/2008)  . Intracranial hemorrhage     History of. On prophylactic dilantin.  . 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  . Hyperlipidemia   . Hemopericardium     History of in setting of failed radiogrequency ablation for Vtach  . Anemia of chronic disease     BL Hgb 11-13. In setting of ESRD.   [name] Peptic ulcer disease     Unknown history, no records per [employer].   . Cholelithiasis     Noted at least since 01/2006. Asymptomatic.  . Erectile dysfunction   . Coronary  artery disease     nonobstructive per cardiac cath (01/2004). LAD has 20% multiple discrete lesions in med and distal portion.   . Peripheral autonomic neuropathy due to diabetes mellitus   . GERD (gastroesophageal reflux disease)   . Glaucoma   . Squamous cell skin cancer, finger     History of. Marginal resection in 05/2008 with recurrent infection/ lytic lesion involving distal phalanx  . Toe amputation status     Ischemic fourth and fifith toe left (03/2009), history of previous 1,2,3 toe  amputations.   [name] MRSA (methicillin resistant staph aureus) culture positive     Verify type - Per medical history form dated 02/13/10.  . Kidney disease     Per medical history form dated 02/13/10.  [name] Difficulty urinating     per medical history dated 02/13/10.  [name] Poor circulation     per medical history form dated 02/13/10.    ROS:   All systems reviewed and negative except as noted in the HPI.   Past Surgical History  Procedure Date  . Cardiac defibrillator placement 11/05    [employer]  . Transplant pancreatic allograft 2002  . Toe amputation 03/2009    left 4th, 5th toes. Previous 1,2, 3 toe left.  . Kidney transplant 2002  . 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 Hypertension Brother   . Hypertension Sister   . Coronary artery disease Mother     x 5 vessel graft in her 25s  . Hypertension Mother   . Prostate cancer Father   . Hypertension Father   . Hypertension Brother      History   Social History  . Marital Status: Married    Spouse Name: N/A    Number of Children: 0  . Years of Education: 12th grade   Occupational History  . UNEMPLOYED    Social History Main Topics  . Smoking status: Never Smoker   . Smokeless tobacco: Not on file  . Alcohol Use: No     previously drank heavily from 1980-1987  . Drug Use: No  . Sexually Active: Not on file   Other Topics  Concern  . Not on file   Social  History Narrative   The patient lives in [place] with his wife and stepdaughter.  He denies any tobacco use.  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      BP 142/75  Pulse 79  Ht 5' 10.5" (1.791 m)  Wt 125 lb (56.7 kg)  BMI 17.68 kg/m2  Physical Exam:  Chronically ill appearing middle aged man, NAD HEENT: Unremarkable Neck:  No JVD, no thyromegally Lymphatics:  No adenopathy Back:  No CVA tenderness Lungs:  Clear with no wheezes, rales, or rhonchi. Well healed ICD incision. HEART:  Regular rate rhythm, no murmurs, no rubs, no clicks Abd:  soft, positive bowel sounds, no organomegally, no rebound, no guarding Ext:  2 plus pulses, no edema, no cyanosis, no clubbing Skin:  No rashes no nodules Neuro:  CN II through XII intact, motor grossly intact  DEVICE  Normal device function.  See PaceArt for details.   Assess/Plan:

## 2010-12-15 NOTE — Assessment & Plan Note (Signed)
His device is working normally. Will recheck in several months. 

## 2010-12-15 NOTE — Assessment & Plan Note (Signed)
His symptoms have improved since his BiV implant. He will continue his current meds.

## 2010-12-15 NOTE — Assessment & Plan Note (Signed)
His ventricular tachycardia has been fairly well controlled. He had one episode in August with successful ATP. No change in meds for now.

## 2010-12-19 ENCOUNTER — Encounter: Payer: Self-pay | Admitting: Thoracic Diseases

## 2010-12-19 ENCOUNTER — Ambulatory Visit (INDEPENDENT_AMBULATORY_CARE_PROVIDER_SITE_OTHER): Payer: Medicare Other | Admitting: Thoracic Diseases

## 2010-12-19 ENCOUNTER — Telehealth: Payer: Self-pay

## 2010-12-19 VITALS — BP 118/61 | HR 78 | Resp 16 | Ht 70.0 in | Wt 126.5 lb

## 2010-12-19 DIAGNOSIS — L98499 Non-pressure chronic ulcer of skin of other sites with unspecified severity: Secondary | ICD-10-CM

## 2010-12-19 DIAGNOSIS — I739 Peripheral vascular disease, unspecified: Secondary | ICD-10-CM

## 2010-12-19 NOTE — Progress Notes (Signed)
[facility] [place]  Postoperative Visit - Amputation Date of Surgery: 6 weeks ago - right AKA, staples removed 12/10/2010 Surgeon: Vivi Martens  History of Present Illness  [name] is a 50 y.o. male who presents for postoperative follow-up ZOX:WRUEA AKA: who fell on his stump this am and the wound opened on the medial aspect.  The patient's reports bloody drainage at the wound after he fell on it this am. He states he has had large scabs on the incision but no previous drainage. The daughter states there was a small opening with few drops of drainage on the medial aspect of the wound after the staples came out but this has opened further after fall.. Pt.denies increased pain in the stump. The patient notes pain is well controlled.   Physical Examination  Filed Vitals:   12/19/10 1513  BP: 118/61  Pulse: 78  Resp: 16    WDWN male in NAD  Right amputation wound is open and bloody drainage.  There is good  bone coverage in the stump Stump is warm and well perfused, with min bloody drainage; without erythema   There was some exudative and necrotic tissue in the open area.  This area was probed and found not to track down to the deep layers. It was 1cm deep x 1cm wide by 4 cm long.  This area was debrided and multiple scabs were removed.  Assessment/plan:  SEABORN NAKAMA is a 50 y.o. male who is s/p right AKA: who fell and opened medial aspect of this wound. . I suspect there was a small opening prior to this fall as there is old necrotic tissue in this open area  The patient's stump is viable.  The patient has not been referred for prosthetic fitting at this time as the stump has a wound issue.  The patient will have HH RN daily for santyl and wet-dry dressing to RLE AKA stump wound.  He will F/U with Dr. Myra Gianotti Monday for wound check and to assess whether this incision will need revision to heal.  No antibiotics were given at this time  Clinic MD: CS  Dickson,MD

## 2010-12-19 NOTE — Patient Instructions (Signed)
[name] [facility] contacted for wound care

## 2010-12-19 NOTE — Telephone Encounter (Signed)
Pt. Called to report a fall this am and has a new small open area mid-incision that is approx. Fingertip size in diameter.  States small amt bleeding noted on bandage.  Also reports a "crusty, dried dnge"  along incision line that has been there.  Denies any increased inflammation.  Advised will schedule appt. to have stump incision evaluated today @ 3:00pm.

## 2010-12-24 ENCOUNTER — Ambulatory Visit (INDEPENDENT_AMBULATORY_CARE_PROVIDER_SITE_OTHER): Payer: Medicare Other | Admitting: Surgery

## 2010-12-24 ENCOUNTER — Encounter: Payer: Self-pay | Admitting: Surgery

## 2010-12-24 VITALS — BP 118/66 | HR 78 | Temp 97.6°F | Resp 20

## 2010-12-24 DIAGNOSIS — L98499 Non-pressure chronic ulcer of skin of other sites with unspecified severity: Secondary | ICD-10-CM

## 2010-12-24 DIAGNOSIS — I739 Peripheral vascular disease, unspecified: Secondary | ICD-10-CM

## 2010-12-24 NOTE — Progress Notes (Signed)
The patient is back today for followup of his right above knee amputation. He fell on his stump the previous weekend just after having his staples removed. The wound opened up slightly on the inferior aspect. He was seen by our physician assistant Thurston Hole referred back to me today. He is now getting Statyl for dressing changes of a small area of necrotic tissue.  On examination there is granulation tissue at the base of the wound there is a small amount of necrotic tissue inferiorly there is no evidence of infection. At this point I believe it is reasonable to think that this wound would heal with dressing changes. We will continue the Santly on the necrotic area and once this has been removed switch him back to wet-to-dry dressing changes. I'll see him back in 6 weeks. The patient was told to contact me should he notice a change or concern and his wound.

## 2011-01-14 ENCOUNTER — Ambulatory Visit: Payer: Medicare Other | Admitting: Surgery

## 2011-01-29 ENCOUNTER — Encounter: Payer: Medicare Other | Admitting: Internal Medicine

## 2011-02-01 ENCOUNTER — Encounter: Payer: Self-pay | Admitting: Surgery

## 2011-02-04 ENCOUNTER — Encounter: Payer: Self-pay | Admitting: Surgery

## 2011-02-04 ENCOUNTER — Ambulatory Visit (INDEPENDENT_AMBULATORY_CARE_PROVIDER_SITE_OTHER): Payer: Medicare Other | Admitting: Surgery

## 2011-02-04 VITALS — BP 131/75 | HR 79 | Temp 97.9°F | Ht 70.0 in

## 2011-02-04 DIAGNOSIS — L98499 Non-pressure chronic ulcer of skin of other sites with unspecified severity: Secondary | ICD-10-CM

## 2011-02-04 DIAGNOSIS — I739 Peripheral vascular disease, unspecified: Secondary | ICD-10-CM

## 2011-02-04 NOTE — Progress Notes (Signed)
The patient comes back today for a wound check of his right above-knee amputation. This has nearly healed. There is minimal drainage. There is no evidence of infection. He does have a small eschar on the inferior portion of the wound. It measured approximately 3 cm in length and 2 mm in width. Essentially the wound has completely healed.  I am referring him back to by [facility] for prosthesis fitting. I will changes home health visits to once a week. They are essentially measuring his wound. He is scheduled to see me back in 6 weeks. I told him that his wound has completely healed he can call and cancel this visit

## 2011-03-03 ENCOUNTER — Inpatient Hospital Stay (HOSPITAL_COMMUNITY)
Admission: AD | Admit: 2011-03-03 | Discharge: 2011-03-05 | DRG: 308 | Disposition: A | Discharge status: Home / Self Care | Payer: Medicare Other | Source: Other Acute Inpatient Hospital | Attending: Cardiology | Admitting: Cardiology

## 2011-03-03 ENCOUNTER — Telehealth: Payer: Self-pay | Admitting: Physician Assistant

## 2011-03-03 ENCOUNTER — Inpatient Hospital Stay (HOSPITAL_COMMUNITY): Payer: Medicare Other

## 2011-03-03 DIAGNOSIS — N2581 Secondary hyperparathyroidism of renal origin: Secondary | ICD-10-CM | POA: Diagnosis present

## 2011-03-03 DIAGNOSIS — Z79899 Other long term (current) drug therapy: Secondary | ICD-10-CM

## 2011-03-03 DIAGNOSIS — S78119A Complete traumatic amputation at level between unspecified hip and knee, initial encounter: Secondary | ICD-10-CM

## 2011-03-03 DIAGNOSIS — T82198A Other mechanical complication of other cardiac electronic device, initial encounter: Secondary | ICD-10-CM | POA: Insufficient documentation

## 2011-03-03 DIAGNOSIS — I120 Hypertensive chronic kidney disease with stage 5 chronic kidney disease or end stage renal disease: Secondary | ICD-10-CM | POA: Diagnosis present

## 2011-03-03 DIAGNOSIS — E039 Hypothyroidism, unspecified: Secondary | ICD-10-CM | POA: Diagnosis present

## 2011-03-03 DIAGNOSIS — I472 Ventricular tachycardia, unspecified: Principal | ICD-10-CM | POA: Diagnosis present

## 2011-03-03 DIAGNOSIS — Z8042 Family history of malignant neoplasm of prostate: Secondary | ICD-10-CM

## 2011-03-03 DIAGNOSIS — IMO0002 Reserved for concepts with insufficient information to code with codable children: Secondary | ICD-10-CM

## 2011-03-03 DIAGNOSIS — I2510 Atherosclerotic heart disease of native coronary artery without angina pectoris: Secondary | ICD-10-CM | POA: Diagnosis present

## 2011-03-03 DIAGNOSIS — I4901 Ventricular fibrillation: Secondary | ICD-10-CM | POA: Diagnosis present

## 2011-03-03 DIAGNOSIS — Z7982 Long term (current) use of aspirin: Secondary | ICD-10-CM

## 2011-03-03 DIAGNOSIS — I4729 Other ventricular tachycardia: Principal | ICD-10-CM | POA: Diagnosis present

## 2011-03-03 DIAGNOSIS — N186 End stage renal disease: Secondary | ICD-10-CM | POA: Diagnosis present

## 2011-03-03 DIAGNOSIS — E785 Hyperlipidemia, unspecified: Secondary | ICD-10-CM | POA: Diagnosis present

## 2011-03-03 DIAGNOSIS — S98139A Complete traumatic amputation of one unspecified lesser toe, initial encounter: Secondary | ICD-10-CM

## 2011-03-03 DIAGNOSIS — I428 Other cardiomyopathies: Secondary | ICD-10-CM

## 2011-03-03 DIAGNOSIS — Z951 Presence of aortocoronary bypass graft: Secondary | ICD-10-CM

## 2011-03-03 DIAGNOSIS — Z992 Dependence on renal dialysis: Secondary | ICD-10-CM

## 2011-03-03 DIAGNOSIS — E109 Type 1 diabetes mellitus without complications: Secondary | ICD-10-CM | POA: Diagnosis present

## 2011-03-03 DIAGNOSIS — I499 Cardiac arrhythmia, unspecified: Secondary | ICD-10-CM | POA: Diagnosis present

## 2011-03-03 DIAGNOSIS — K219 Gastro-esophageal reflux disease without esophagitis: Secondary | ICD-10-CM | POA: Diagnosis present

## 2011-03-03 DIAGNOSIS — S68118A Complete traumatic metacarpophalangeal amputation of other finger, initial encounter: Secondary | ICD-10-CM

## 2011-03-03 DIAGNOSIS — I5022 Chronic systolic (congestive) heart failure: Secondary | ICD-10-CM | POA: Diagnosis present

## 2011-03-03 DIAGNOSIS — Z8249 Family history of ischemic heart disease and other diseases of the circulatory system: Secondary | ICD-10-CM

## 2011-03-03 DIAGNOSIS — Z8614 Personal history of Methicillin resistant Staphylococcus aureus infection: Secondary | ICD-10-CM

## 2011-03-03 DIAGNOSIS — Z940 Kidney transplant status: Secondary | ICD-10-CM

## 2011-03-03 DIAGNOSIS — Z95810 Presence of automatic (implantable) cardiac defibrillator: Secondary | ICD-10-CM

## 2011-03-03 DIAGNOSIS — D631 Anemia in chronic kidney disease: Secondary | ICD-10-CM | POA: Diagnosis present

## 2011-03-03 HISTORY — DX: Ventricular tachycardia, unspecified: I47.20

## 2011-03-03 HISTORY — DX: Ventricular tachycardia: I47.2

## 2011-03-03 HISTORY — DX: Other mechanical complication of other cardiac electronic device, initial encounter: T82.198A

## 2011-03-03 LAB — DIFFERENTIAL
Basophils Absolute: 0.1 10*3/uL (ref 0.0–0.1)
Eosinophils Absolute: 0.2 10*3/uL (ref 0.0–0.7)
Eosinophils Relative: 4 % (ref 0–5)
Lymphs Abs: 0.8 10*3/uL (ref 0.7–4.0)
Neutrophils Relative %: 66 % (ref 43–77)

## 2011-03-03 LAB — MAGNESIUM: Magnesium: 2.2 mg/dL (ref 1.5–2.5)

## 2011-03-03 LAB — PROTIME-INR
INR: 1.16 (ref 0.00–1.49)
Prothrombin Time: 15 seconds (ref 11.6–15.2)

## 2011-03-03 LAB — CBC
MCH: 31 pg (ref 26.0–34.0)
Platelets: 196 10*3/uL (ref 150–400)
RBC: 3.39 MIL/uL — ABNORMAL LOW (ref 4.22–5.81)
RDW: 19.4 % — ABNORMAL HIGH (ref 11.5–15.5)
WBC: 4 10*3/uL (ref 4.0–10.5)

## 2011-03-03 LAB — BASIC METABOLIC PANEL
CO2: 33 mEq/L — ABNORMAL HIGH (ref 19–32)
Calcium: 9.4 mg/dL (ref 8.4–10.5)
Creatinine, Ser: 3.22 mg/dL — ABNORMAL HIGH (ref 0.50–1.35)
GFR calc Af Amer: 24 mL/min — ABNORMAL LOW (ref >90)
GFR calc non Af Amer: 21 mL/min — ABNORMAL LOW (ref >90)
Sodium: 140 mEq/L (ref 135–145)

## 2011-03-03 MED ORDER — ASPIRIN 300 MG RE SUPP
300.0000 mg | RECTAL | Status: AC
  Filled 2011-03-03: qty 1

## 2011-03-03 MED ORDER — AZATHIOPRINE 50 MG PO TABS
75.0000 mg | ORAL_TABLET | Freq: Every day | ORAL | Status: DC
  Administered 2011-03-04 – 2011-03-05 (×2): 75 mg via ORAL
  Filled 2011-03-03 (×2): qty 2

## 2011-03-03 MED ORDER — ONDANSETRON HCL 4 MG/2ML IJ SOLN
4.0000 mg | Freq: Four times a day (QID) | INTRAMUSCULAR | Status: DC | PRN
  Filled 2011-03-03 (×2): qty 2

## 2011-03-03 MED ORDER — LUTEIN 20 MG PO TABS: 1.0000 | ORAL_TABLET | Freq: Every day | ORAL | Status: DC

## 2011-03-03 MED ORDER — VITAMIN C 500 MG PO TABS
500.0000 mg | ORAL_TABLET | Freq: Two times a day (BID) | ORAL | Status: DC
  Administered 2011-03-03 – 2011-03-05 (×4): 500 mg via ORAL
  Filled 2011-03-03 (×5): qty 1

## 2011-03-03 MED ORDER — GUAIFENESIN ER 600 MG PO TB12
1200.0000 mg | ORAL_TABLET | Freq: Two times a day (BID) | ORAL | Status: DC
  Administered 2011-03-03 – 2011-03-05 (×4): 1200 mg via ORAL
  Filled 2011-03-03 (×5): qty 2

## 2011-03-03 MED ORDER — ACETAMINOPHEN 325 MG PO TABS: 650.0000 mg | ORAL_TABLET | ORAL | Status: DC | PRN

## 2011-03-03 MED ORDER — LORATADINE 10 MG PO TABS
10.0000 mg | ORAL_TABLET | Freq: Every day | ORAL | Status: DC
  Administered 2011-03-04 – 2011-03-05 (×2): 10 mg via ORAL
  Filled 2011-03-03 (×2): qty 1

## 2011-03-03 MED ORDER — PHENYTOIN SODIUM EXTENDED 100 MG PO CAPS
100.0000 mg | ORAL_CAPSULE | Freq: Three times a day (TID) | ORAL | Status: DC
  Administered 2011-03-03 – 2011-03-05 (×6): 100 mg via ORAL
  Filled 2011-03-03 (×7): qty 1

## 2011-03-03 MED ORDER — ASPIRIN 81 MG PO CHEW: 324.0000 mg | CHEWABLE_TABLET | ORAL | Status: AC

## 2011-03-03 MED ORDER — AZITHROMYCIN 250 MG PO TABS
250.0000 mg | ORAL_TABLET | Freq: Every day | ORAL | Status: DC
  Administered 2011-03-04 – 2011-03-05 (×2): 250 mg via ORAL
  Filled 2011-03-03 (×2): qty 1

## 2011-03-03 MED ORDER — ROSUVASTATIN CALCIUM 5 MG PO TABS
5.0000 mg | ORAL_TABLET | Freq: Every day | ORAL | Status: DC
  Administered 2011-03-03 – 2011-03-04 (×2): 5 mg via ORAL
  Filled 2011-03-03 (×3): qty 1

## 2011-03-03 MED ORDER — ASPIRIN EC 81 MG PO TBEC
81.0000 mg | DELAYED_RELEASE_TABLET | Freq: Every day | ORAL | Status: DC
  Administered 2011-03-04 – 2011-03-05 (×2): 81 mg via ORAL
  Filled 2011-03-03 (×2): qty 1

## 2011-03-03 MED ORDER — TACROLIMUS 1 MG PO CAPS
1.0000 mg | ORAL_CAPSULE | Freq: Two times a day (BID) | ORAL | Status: DC
  Administered 2011-03-03 – 2011-03-04 (×2): 1 mg via ORAL
  Filled 2011-03-03 (×3): qty 1

## 2011-03-03 MED ORDER — IPRATROPIUM-ALBUTEROL 18-103 MCG/ACT IN AERO
2.0000 | INHALATION_SPRAY | RESPIRATORY_TRACT | Status: DC | PRN
  Filled 2011-03-03: qty 14.7

## 2011-03-03 MED ORDER — RENA-VITE PO TABS
1.0000 | ORAL_TABLET | Freq: Every day | ORAL | Status: DC
  Administered 2011-03-03 – 2011-03-04 (×2): 1 via ORAL
  Filled 2011-03-03 (×3): qty 1

## 2011-03-03 MED ORDER — BIMATOPROST 0.03 % OP SOLN
1.0000 [drp] | Freq: Every day | OPHTHALMIC | Status: DC
  Administered 2011-03-04: 1 [drp] via OPHTHALMIC
  Filled 2011-03-03: qty 2.5

## 2011-03-03 MED ORDER — PREDNISONE 5 MG PO TABS
5.0000 mg | ORAL_TABLET | Freq: Every day | ORAL | Status: DC
  Administered 2011-03-04 – 2011-03-05 (×2): 5 mg via ORAL
  Filled 2011-03-03 (×2): qty 1

## 2011-03-03 MED ORDER — HEPARIN SODIUM (PORCINE) 5000 UNIT/ML IJ SOLN
5000.0000 [IU] | Freq: Three times a day (TID) | INTRAMUSCULAR | Status: DC
  Administered 2011-03-03 – 2011-03-05 (×4): 5000 [IU] via SUBCUTANEOUS
  Filled 2011-03-03 (×8): qty 1

## 2011-03-03 MED ORDER — SODIUM CHLORIDE 0.9 % IJ SOLN: 3.0000 mL | INTRAMUSCULAR | Status: DC | PRN

## 2011-03-03 MED ORDER — NITROGLYCERIN 0.4 MG SL SUBL: 0.4000 mg | SUBLINGUAL_TABLET | SUBLINGUAL | Status: DC | PRN

## 2011-03-03 MED ORDER — FAMOTIDINE 20 MG PO TABS
20.0000 mg | ORAL_TABLET | Freq: Every day | ORAL | Status: DC
  Administered 2011-03-04 – 2011-03-05 (×2): 20 mg via ORAL
  Filled 2011-03-03 (×2): qty 1

## 2011-03-03 MED ORDER — LEVOTHYROXINE SODIUM 25 MCG PO TABS
225.0000 ug | ORAL_TABLET | Freq: Every day | ORAL | Status: DC
  Administered 2011-03-04 – 2011-03-05 (×2): 225 ug via ORAL
  Filled 2011-03-03 (×2): qty 1

## 2011-03-03 MED ORDER — LANTHANUM CARBONATE 500 MG PO CHEW
1000.0000 mg | CHEWABLE_TABLET | Freq: Three times a day (TID) | ORAL | Status: DC
  Administered 2011-03-04 (×3): 1000 mg via ORAL
  Filled 2011-03-03 (×7): qty 2

## 2011-03-03 MED ORDER — SODIUM CHLORIDE 0.9 % IJ SOLN
3.0000 mL | Freq: Two times a day (BID) | INTRAMUSCULAR | Status: DC
  Administered 2011-03-03 – 2011-03-05 (×4): 3 mL via INTRAVENOUS

## 2011-03-03 MED ORDER — SODIUM CHLORIDE 0.9 % IV SOLN: 250.0000 mL | INTRAVENOUS | Status: DC | PRN

## 2011-03-03 NOTE — Telephone Encounter (Signed)
[name] called with episode of shock from defibrillator this AM around 7:45am while on dialysis and then an episode of dizziness as he was coming off dialysis at 10:45 am - feels like he had ATP.  Episode of bronchitis recently.  On Zpak.    I have recommended he go to the closest ED for ICD interrogation.  He agrees with this plan.  Please contact patient Monday 03/04/11 and bring in for device check if necessary. Tereso Newcomer, PA-C  11:46 AM 03/03/2011

## 2011-03-03 NOTE — H&P (Signed)
Subjective:  [name] is a 50 y.o. male who presents as a transfer for further evaluation and management after AICD fired this am.  Patient was in HD as scheduled this am when he felt discharge around 740am.  He denied any symptoms prior to discharge.  He has had no recent chest pain, shortness of breath or palpitations.  His device was interrogated and revealed 3 episodes of VT/VF (patient asymptomatic) with successful ATP and two episodes on 12/30 ATP then shock, and recurrent episodes tx with ATP.  Both days were on HD sessions and he has not missed any recent sessions.    Review of Systems He states that he has had a productive cough for over month. Treated with Zpak on second course.  No fevers, chills.  A comprehensive review of systems was negative other than stated in HPI.   Patient Active Problem List  Diagnoses Date Noted  . Ventricular arrhythmia 03/03/2011  . Atherosclerosis of native arteries of the extremities with ulceration 12/03/2010  . Diabetes mellitus 08/21/2010  . Wears glasses/contacts 08/21/2010  . Glaucoma/retinopathy 08/21/2010  . MRSA (methicillin resistant staph aureus) culture positive 08/21/2010  . Pacemaker 08/21/2010  . Kidney disease/stone 08/21/2010  . Difficulty urinating 08/21/2010  . CHRONIC SYSTOLIC HEART FAILURE 06/16/2008  . END STAGE RENAL DISEASE 06/16/2008  . CHEST PAIN-PAINFUL RESPIRATIONS 06/16/2008  . HYPOTHYROIDISM 06/14/2008  . HYPERTENSION, UNSPECIFIED 06/14/2008  . TACHYCARDIA 06/14/2008  . HYPERPARATHYROIDISM, HX OF 06/14/2008  . ICD - IN SITU 06/14/2008   Past Medical History  Diagnosis Date  . Secondary hyperparathyroidism, renal     In setting of ESRD.  [name] Hypothyroidism   . Automatic implantable cardiac defibrillator in situ     History of implanted dual-chamber ICD in 01/2004, with removal and insertion of a Bi-V ICD in 08/2009 by Dr. 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  Hypertension   . Nonischemic cardiomyopathy     2D-echocardiogram (08/2007) - LV EF 45%, akinesis of inferoseptal wall, inferior wall,   . ESRD on hemodialysis Started age 35yo    on MWF schedule. Previous failed renal transplant 1998 at [facility] (with rejection in 1998). 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 Resumed HD 10/2006.   [name] DM type 1 (diabetes mellitus, type 1) DX: age 49yo    previously well controlled with Aic 5 (12/2008)  . Intracranial hemorrhage     History of. On prophylactic dilantin.  . 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  . Hyperlipidemia   . Hemopericardium     History of in setting of failed radiogrequency ablation for Vtach  . Anemia of chronic disease     BL Hgb 11-13. In setting of ESRD.   [name] Peptic ulcer disease     Unknown history, no records per [employer].   . Cholelithiasis     Noted at least since 01/2006. Asymptomatic.  . Erectile dysfunction   . Coronary artery disease     nonobstructive per cardiac cath (01/2004). LAD has 20% multiple discrete lesions in med and distal portion.   . Peripheral autonomic neuropathy due to diabetes mellitus   . GERD (gastroesophageal reflux disease)   . Glaucoma   . Squamous cell skin cancer, finger     History of. Marginal resection in 05/2008 with recurrent infection/ lytic lesion involving distal phalanx  . Toe amputation  status     Ischemic fourth and fifith toe left (03/2009), history of previous 1,2,3 toe  amputations.   [name] MRSA (methicillin resistant staph aureus) culture positive     Verify type - Per medical history form dated 02/13/10.  . Kidney disease     Per medical history form dated 02/13/10.  [name] Difficulty urinating     per medical history dated 02/13/10.  [name] Poor circulation     per medical history form dated 02/13/10.    Past Surgical History  Procedure Date  . Cardiac  defibrillator placement 11/05    St Jude  . Transplant pancreatic allograft 2002  . Toe amputation 03/2009    left 4th, 5th toes. Previous 1,2, 3 toe left.  . Kidney transplant 2002  . Above knee leg amputation 11/09/10    Right AKA    Prescriptions prior to admission  Medication Sig Dispense Refill  . azaTHIOprine (IMURAN) 50 MG tablet Take 1 and a half tablets once daily.       [name] azithromycin (ZITHROMAX) 250 MG tablet Take 250 mg by mouth daily.        . B Complex-C-Folic Acid (B COMPLEX-VITAMIN C-FOLIC ACID) 1 MG tablet Take 1 tablet by mouth daily.        . bimatoprost (LUMIGAN) 0.03 % ophthalmic drops 1 gtt once daily       . Calcium Polycarbophil (EQUALACTIN PO) Take 2 tablets by mouth 2 (two) times daily.        . cetirizine (ZYRTEC) 10 MG tablet Take 10 mg by mouth daily.        [name] guaiFENesin (MUCINEX) 600 MG 12 hr tablet Take 1,200 mg by mouth 2 (two) times daily.        Maximino Greenland IN Inhale 1-2 Inhalers into the lungs. Take every 4-6 hrs       . lanthanum (FOSRENOL) 1000 MG chewable tablet 1 tab four times a day       . levothyroxine (SYNTHROID, LEVOTHROID) 200 MCG tablet Take 225 mcg by mouth daily.       . Lutein 20 MG TABS Take 1 tablet by mouth daily.        . midodrine (PROAMATINE) 5 MG tablet Take one by mouth before dialysis and if BP < 100       . nizatidine (AXID) 150 MG capsule Take 150 mg by mouth daily.        . Omega-3 Fatty Acids (SALMON OIL) 200 MG CAPS Take 1 cap twice a day      . phenytoin (DILANTIN) 100 MG ER capsule Take by mouth 3 (three) times daily.        . predniSONE (DELTASONE) 5 MG tablet Take 5 mg by mouth daily.        . rosuvastatin (CRESTOR) 5 MG tablet Take one tablet by mouth every other day.       . tacrolimus (PROGRAF) 1 MG capsule 2 capsules, two times a day       . vitamin C (ASCORBIC ACID) 500 MG tablet Take 500 mg by mouth 2 (two) times daily.         Allergies  Allergen Reactions  . Avelox (Moxifloxacin Hcl In Nacl)      Ask patient to clarify type of medication. Also need to know type/severity/reaction.  . Ceftriaxone Sodium Nausea And Vomiting  . Dilaudid (Hydromorphone Hcl)     sensitivity  . Protamine     Significant hypotension?  . Rocephin (Ceftriaxone Sodium  In Dextrose)     Ask patient to clarify type of medication. Also need to know type/severity/reaction.  . Ciprofloxacin Rash  . Moxifloxacin Rash    History  Substance Use Topics  . Smoking status: Never Smoker   . Smokeless tobacco: Not on file  . 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 Hypertension Brother   . Hypertension Sister   . Coronary artery disease Mother     x 5 vessel graft in her 65s  . Hypertension Mother   . Prostate cancer Father   . Hypertension Father   . Hypertension Brother     Objective:  Patient Vitals for the past 8 hrs:  BP Temp Temp src Pulse Resp SpO2 Weight  03/03/11 1922 138/74 mmHg 98.5 F (36.9 C) Oral 83  18  98 % 56.5 kg (124 lb 9 oz)   General Appearance:    Alert, cooperative, no distress, appears older than stated age  Head:    Normocephalic, without obvious abnormality, atraumatic  Lungs:     Clear to auscultation bilaterally, respirations unlabored  Heart:    Regular rate and rhythm, 2/6 sys murmur  Abdomen:     Soft, non-tender, bowel sounds active all four quadrants,    no masses, no organomegaly  Extremities:   Right AKA  Pulses:   2+ and symmetric all extremities  Skin:   no rashes or lesions  Neurologic:   CNII-XII intact. Normal strength, sensation and reflexes      throughout  ECG:  Electronic pacemaker HR 82 with PVCs  Assessment:  ICD Discharge Ventricular arrhythmias  Plan:  1. Admit to Cardiology 2. Telemetry, Repeat EKG and prn chest pain or arrythmia 3. Repeat CBC, BMP, Mg, TSH, replace Potassium low at OSH 4. If return of Vent Arrymthmia, will initate Amiodarone 5. Further plan per EP.

## 2011-03-04 ENCOUNTER — Other Ambulatory Visit: Payer: Self-pay

## 2011-03-04 ENCOUNTER — Encounter (HOSPITAL_COMMUNITY): Payer: Self-pay | Admitting: Internal Medicine

## 2011-03-04 DIAGNOSIS — I472 Ventricular tachycardia: Principal | ICD-10-CM

## 2011-03-04 DIAGNOSIS — I428 Other cardiomyopathies: Secondary | ICD-10-CM

## 2011-03-04 DIAGNOSIS — T82198A Other mechanical complication of other cardiac electronic device, initial encounter: Secondary | ICD-10-CM | POA: Insufficient documentation

## 2011-03-04 LAB — CARDIAC PANEL(CRET KIN+CKTOT+MB+TROPI)
CK, MB: 1.8 ng/mL (ref 0.3–4.0)
Relative Index: INVALID (ref 0.0–2.5)
Total CK: 33 U/L (ref 7–232)

## 2011-03-04 LAB — BASIC METABOLIC PANEL
BUN: 16 mg/dL (ref 6–23)
Chloride: 97 mEq/L (ref 96–112)
GFR calc Af Amer: 19 mL/min — ABNORMAL LOW (ref >90)
GFR calc non Af Amer: 16 mL/min — ABNORMAL LOW (ref >90)
Potassium: 3.4 mEq/L — ABNORMAL LOW (ref 3.5–5.1)
Sodium: 141 mEq/L (ref 135–145)

## 2011-03-04 LAB — CBC
HCT: 32.6 % — ABNORMAL LOW (ref 39.0–52.0)
MCHC: 32.2 g/dL (ref 30.0–36.0)
Platelets: 174 10*3/uL (ref 150–400)
RDW: 19.4 % — ABNORMAL HIGH (ref 11.5–15.5)
WBC: 4.5 10*3/uL (ref 4.0–10.5)

## 2011-03-04 LAB — PROTIME-INR
INR: 1.19 (ref 0.00–1.49)
Prothrombin Time: 15.4 seconds — ABNORMAL HIGH (ref 11.6–15.2)

## 2011-03-04 LAB — TSH: TSH: 1.02 u[IU]/mL (ref 0.350–4.500)

## 2011-03-04 MED ORDER — LOPERAMIDE HCL 2 MG PO CAPS
2.0000 mg | ORAL_CAPSULE | ORAL | Status: DC | PRN
  Filled 2011-03-04: qty 1

## 2011-03-04 MED ORDER — CARVEDILOL 3.125 MG PO TABS
3.1250 mg | ORAL_TABLET | Freq: Two times a day (BID) | ORAL | Status: DC
  Administered 2011-03-04 – 2011-03-05 (×3): 3.125 mg via ORAL
  Filled 2011-03-04 (×4): qty 1

## 2011-03-04 MED ORDER — AMIODARONE HCL 200 MG PO TABS
400.0000 mg | ORAL_TABLET | Freq: Two times a day (BID) | ORAL | Status: DC
  Administered 2011-03-04 – 2011-03-05 (×3): 400 mg via ORAL
  Filled 2011-03-04 (×4): qty 2

## 2011-03-04 MED ORDER — TACROLIMUS 1 MG PO CAPS
2.0000 mg | ORAL_CAPSULE | Freq: Two times a day (BID) | ORAL | Status: DC
  Administered 2011-03-04 – 2011-03-05 (×2): 2 mg via ORAL
  Filled 2011-03-04 (×3): qty 2

## 2011-03-04 NOTE — Consult Note (Cosign Needed)
.asep  Patient Name: [name]      SUBJECTIVE: The we are asked to see [name] because of ICD discharges.  He has a history of nonischemic heart disease complicated by ventricular tachycardia for which he underwent catheter ablation 2005. This procedure was complicated by perforation requiring median sternotomy. 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 Which was done at [facility] with tunneling from the left side to the right side.  He has long-standing diabetes and has been on dialysis for 25 years. He is dialyzed through his left leg. Previous access in his upper extremities have occluded. He is status post recent right AKA.   He is a relatively functionally stable until a couple weeks ago where he noted increasing shortness of breath while using his walker. He denies chest pain. Catheterization remotely had demonstrated only nonobstructive disease.    He also is a history of treated hypothyroidism  Past Medical History  Diagnosis Date  . Secondary hyperparathyroidism, renal     In setting of ESRD.  [name] Hypothyroidism   . Automatic implantable cardiac defibrillator in situ     CRT-ICD   . Hypertension   . Nonischemic cardiomyopathy     2D-echocardiogram (08/2007) - LV EF 45%, akinesis of inferoseptal wall, inferior wall,   . ESRD on hemodialysis Started age 15yo    on MWF schedule. Previous failed renal transplant 1998 at [facility] (with rejection in 1998). 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 Resumed HD 10/2006.   [name] DM type 1 (diabetes mellitus, type 1) DX: age 38yo    previously well controlled with Aic 5 (12/2008)  . Intracranial hemorrhage     History of. On prophylactic dilantin.  . 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  . Hyperlipidemia   .  Hemopericardium     History of in setting of failed radiogrequency ablation for Vtach  . Anemia of chronic disease     BL Hgb 11-13. In setting of ESRD.   [name] Peptic ulcer disease     Unknown history, no records per [employer].   . Cholelithiasis     Noted at least since 01/2006. Asymptomatic.  . Erectile dysfunction   . Peripheral autonomic neuropathy due to diabetes mellitus   . GERD (gastroesophageal reflux disease)   . Glaucoma   . Squamous cell skin cancer, finger     History of. Marginal resection in 05/2008 with recurrent infection/ lytic lesion involving distal phalanx  . Toe amputation status     Ischemic fourth and fifith toe left (03/2009), history of previous 1,2,3 toe  amputations.   [name] MRSA (methicillin resistant staph aureus) culture positive     Verify type - Per medical history form dated 02/13/10.  [name] Poor circulation     per medical history form dated 02/13/10.  . Ventricular tachycardia     appropriate ATP and Shocks 12/12  . RIATA LEAD     EXTERNALIZATION 12/12 (sk) // high RV threshold   Family History  Problem Relation Age of Onset  . Hypertension Brother   . Hypertension Sister   . Coronary artery disease Mother     x 5 vessel graft in her 35s  . Hypertension Mother   . Prostate cancer Father   . Hypertension Father   . Hypertension Brother    History  Social History  . Marital Status: Married    Spouse Name: N/A    Number of Children: 0  . Years of Education: 12th grade   Occupational History  . UNEMPLOYED    Social History Main Topics  . Smoking status: Never Smoker   . Smokeless tobacco: Not on file  . Alcohol Use: No     previously drank heavily from 1980-1987  . Drug Use: No  . Sexually Active: Not on file   Other Topics Concern  . Not on file   Social History Narrative   The patient lives in [place] with his wife and stepdaughter.  He denies any tobacco use.  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     PHYSICAL EXAM Filed Vitals:   03/03/11 1922  BP: 138/74  Pulse: 83  Temp: 98.5 F (36.9 C)  TempSrc: Oral  Resp: 18  Height: 5\' 10"  (1.778 m)  Weight: 124 lb 9 oz (56.5 kg)  SpO2: 98%    Alert and oriented in no acute distress HENT- normal Eyes- EOMI, without scleral icterus but very poor visual acuity Skin- warm and dry; without rashes LN-neg Neck- supple without thyromegaly, JVP-flat, carotids brisk and full without bruits Back-without CVAT or kyphosis Lungs-bibasilar rhonchi and crack  CV-Regular rate and rhythm, nl S1 and S2, a 2/6 murmur is heard along the right sternal border radiating to the neck and another holosystolic murmur at the apex Abd-soft with active bowel sounds;  Pulses not palpable distally except in the upper extremities  MKS-without gross deformity; status post right AKA Neuro- Ax O, CN3-12 intact, grossly normal motor and sensory function Affect engaging   TELEMETRY: Reviewed telemetry pt in frequent ventricular ectopy and nonsustained ventricular tachycardia:    Intake/Output Summary (Last 24 hours) at 03/04/11 1330 Last data filed at 03/04/11 0730  Gross per 24 hour  Intake    240 ml  Output      0 ml  Net    240 ml    LABS: Basic Metabolic Panel:  Lab 03/04/11 1610 03/03/11 2240  NA 141 140  K 3.4* 3.7  CL 97 98  CO2 28 33*  GLUCOSE 53* 71  BUN 16 11  CREATININE 3.94* 3.22*  CALCIUM 9.5 9.4  MG -- 2.2  PHOS -- --   Cardiac Enzymes:  Basename 03/04/11 0915  CKTOTAL 33  CKMB 1.8  CKMBINDEX --  TROPONINI <0.30   CBC:  Lab 03/04/11 0500 03/03/11 2240  WBC 4.5 4.0  NEUTROABS -- 2.6  HGB 10.5* 10.5*  HCT 32.6* 32.9*  MCV 98.2 97.1  PLT 174 196   PROTIME:  Basename 03/04/11 0500 03/03/11 2240  LABPROT 15.4* 15.0  INR 1.19 1.16    Device Interrogation: Device demonstrates a St. Jude with multiple episodes of ventricular tachycardia over the  last few days. Some of these been terminated with antitachycardia pacing will charging on the require shock therapy.   ASSESSMENT AND PLAN:  The patient has ventricular tachycardia new in frequency and shocks. The issue of mechanism is important to consider and I would recommend and have recommended to the patient catheterization. He is disinclined at this point to pursue that. From a therapeutic point of view, amiodarone is our only option as he is limited by his renal insufficiency and left ventricular dysfunction from other drugs. Amiodarone has powerful anti-ischemic properties and maybe  will be sufficient in this regard.  I reviewed side effects with the patient. We will plan to initiate amiodarone and continue intensive therapy for his nonischemic cardio myopathy. Will anticipate increasing requirements for thyroid replacement. Will have him follow up with Dr. Ladona Ridgel in the near future to consider catheterization.  We have also reviewed his x-ray. He is externalization of his Riata lead. At this point the lead is functioning although with a very high right ventricular pacing threshold. This will need to be addressed in the future area this will be forthcoming as his battery has only about 6-7 months anticipated survival. Patient Active Hospital Problem List: Ventricular arrhythmia (03/03/2011)   Assessment: As above, we will begin amiodarone    Plan:  Nonischemic cardiomyopathy (03/04/2011)   Assessment: Begin beta blocker therapy to be held on the morning of his dialysis.    Plan:  End stage renal disease (06/16/2008)   Assessment: Needs dialysis; will call renal    Plan:  ICD-CRT  St Judes (06/14/2008)   Assessment: As above the device was interrogated.    Plan: Will follow. Ventricular tachycardia detection rate is 176.  RIATA LEAD ()   Assessment: As above; there is externalization. With a high threshold, this will lead will need to be replaced if at all possible generator change out.     Plan:  HYPOTHYROIDISM (06/14/2008)   Assessment: Anticipate increasing thyroid replacement secondary to amiodarone    Plan:  Chronic systolic heart failure (06/16/2008)   Assessment: Begin beta blocker therapy    Plan:     Signed, Sherryl Manges MD  03/04/2011

## 2011-03-04 NOTE — Telephone Encounter (Signed)
This pt is currently admitted

## 2011-03-04 NOTE — Progress Notes (Signed)
Patient ID: [name], male   DOB: 08-Jan-1961, 50 y.o.   MRN: [id number] @ Subjective:  Denies SSCP, palpitations or Dyspnea   Objective:  Filed Vitals:   03/03/11 1922  BP: 138/74  Pulse: 83  Temp: 98.5 F (36.9 C)  TempSrc: Oral  Resp: 18  Height: 5\' 10"  (1.778 m)  Weight: 124 lb 9 oz (56.5 kg)  SpO2: 98%    Physical Exam: Affect appropriate Older than stated age HEENT: normal Neck supple with no adenopathy JVP normal no bruits no thyromegaly Lungs clear with no wheezing and good diaphragmatic motion Heart:  S1/S2 AS murmur with no ,rub, gallop or click PMI normal Abdomen: benighn, BS positve, no tenderness, no AAA no bruit.  No HSM or HJR Distal pulses  S/P RUKA   gortex fistula LUE No edema Neuro non-focal Skin warm and dry No muscular weakness Amputation digit left hand   Lab Results: Basic Metabolic Panel:  Basename 03/03/11 2240  NA 140  K 3.7  CL 98  CO2 33*  GLUCOSE 71  BUN 11  CREATININE 3.22*  CALCIUM 9.4  MG 2.2  PHOS --   Liver Function Tests: No results found for this basename: AST:2,ALT:2,ALKPHOS:2,BILITOT:2,PROT:2,ALBUMIN:2 in the last 72 hours No results found for this basename: LIPASE:2,AMYLASE:2 in the last 72 hours CBC:  Basename 03/04/11 0500 03/03/11 2240  WBC 4.5 4.0  NEUTROABS -- 2.6  HGB 10.5* 10.5*  HCT 32.6* 32.9*  MCV 98.2 97.1  PLT 174 196      Cardiac Studies:  ECG:  AV pacing RBBB morphoogy   Telemetry: AV pacing no VT  Echo: pend  Medications:     . aspirin  324 mg Oral NOW   Or  . aspirin  300 mg Rectal NOW  . aspirin EC  81 mg Oral Daily  . azaTHIOprine  75 mg Oral Daily  . azithromycin  250 mg Oral Daily  . bimatoprost  1 drop Both Eyes QHS  . famotidine  20 mg Oral Daily  . guaiFENesin  1,200 mg Oral BID  . heparin  5,000 Units Subcutaneous Q8H  . lanthanum  1,000 mg Oral TID WC  . levothyroxine  225 mcg Oral Daily  . loratadine  10 mg Oral Daily  . multivitamin  1 tablet Oral QHS  .  phenytoin  100 mg Oral TID  . predniSONE  5 mg Oral Daily  . rosuvastatin  5 mg Oral QHS  . sodium chloride  3 mL Intravenous Q12H  . tacrolimus  1 mg Oral BID  . vitamin C  500 mg Oral BID  . DISCONTD: Lutein  1 tablet Oral Daily       Assessment/Plan:  VT:  Interogation shows normal function with 2 ATP and one shock.  History of CABG.  No SSCP.  Check echo for LV function Not sure starting amiodarone best idea as he is only 50 but has been on dialysis for 24 years so life expectancy lower.  Will have  EPS advise  ECG with AV pacing  Check enzymes  CRF:  Dialysis for Wendsday.  Labs including Mg and K ok DM.  Continue home meds CHF:  Last EF 45%  euvolemic on dialysis  Echo  ? Add beta blocker for CHF and VT Charlton Haws 03/04/2011, 7:37 AM

## 2011-03-05 MED ORDER — ASPIRIN 81 MG PO TBEC: 81.0000 mg | DELAYED_RELEASE_TABLET | Freq: Every day | ORAL | Status: DC

## 2011-03-05 MED ORDER — CARVEDILOL 3.125 MG PO TABS: 3.1250 mg | ORAL_TABLET | Freq: Two times a day (BID) | ORAL | Status: DC

## 2011-03-05 MED ORDER — NITROGLYCERIN 0.4 MG SL SUBL: 0.4000 mg | SUBLINGUAL_TABLET | SUBLINGUAL | Status: DC | PRN

## 2011-03-05 MED ORDER — LEVOTHYROXINE SODIUM 150 MCG PO TABS: 225.0000 ug | ORAL_TABLET | Freq: Every day | ORAL | Status: DC

## 2011-03-05 MED ORDER — AZITHROMYCIN 250 MG PO TABS: ORAL_TABLET | ORAL | Status: AC

## 2011-03-05 MED ORDER — AZATHIOPRINE 50 MG PO TABS: 75.0000 mg | ORAL_TABLET | Freq: Every day | ORAL | Status: AC

## 2011-03-05 MED ORDER — AMIODARONE HCL 400 MG PO TABS: 400.0000 mg | ORAL_TABLET | Freq: Two times a day (BID) | ORAL | Status: DC

## 2011-03-05 MED ORDER — PROMETHAZINE HCL 25 MG PO TABS
25.0000 mg | ORAL_TABLET | Freq: Four times a day (QID) | ORAL | Status: DC | PRN
  Administered 2011-03-05: 25 mg via ORAL
  Filled 2011-03-05: qty 1

## 2011-03-05 MED ORDER — LANTHANUM CARBONATE 1000 MG PO CHEW: 1000.0000 mg | CHEWABLE_TABLET | Freq: Three times a day (TID) | ORAL | Status: AC

## 2011-03-05 NOTE — Discharge Summary (Signed)
See rounding note from today Trinna Kunst  

## 2011-03-05 NOTE — Progress Notes (Signed)
Pt. Discharged 03/05/2011  4:20 PM Discharge instructions reviewed with patient/family. Patient/family verbalized understanding. All Rx's given. Questions answered as needed. Pt. Discharged to home with family/self. Taken off unit via W/C. Ellery Tash, [employer]

## 2011-03-05 NOTE — Discharge Summary (Signed)
Physician Discharge Summary  Patient ID: [name] MRN: [id number] DOB/AGE: [birth year] 51 y.o.  Admit date: 03/03/2011 Discharge date: 03/05/2011  Primary Discharge Diagnosis 1. Ventricular arrythmias  Secondary Discharge Diagnosis  1.ESRD 2. Nonischemic Cardiomyopathy 3. ICD-CRT [facility] 06/14/2008 4. Hypothyroidism 5.Chronic Systolic HF  Consults: EP-Dr. Graciela Husbands                  Renal: Continuation of Dialysis.  Hospital Course: ABDIAZIZ KLAHN is a 51 y.o. male who presents as a transfer for further evaluation and management after AICD fired this am. Patient was in HD as scheduled this am when he felt discharge around 740am. He denied any symptoms prior to discharge. He has had no recent chest pain, shortness of breath or palpitations. His device was interrogated and revealed 3 episodes of VT/VF (patient asymptomatic) with successful ATP and two episodes on 12/30 ATP then shock, and recurrent episodes tx with ATP. Both days were on HD sessions and he has not missed any recent sessions,He has a history of nonischemic heart disease complicated by ventricular tachycardia for which he underwent catheter ablation 2005. This procedure was complicated by perforation requiring median sternotomy. 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 Which was done at [facility] with tunneling from the left side to the right side.  He was seen by Dr. Graciela Husbands on EP consultation with institution of amiodarone therapy along with Coreg. He tolerated the medications well without side-effects during the hospitalization.  He was seen by Dr. Eden Emms on discharge and found to be stable. He will follow-up with Dr. Ladona Ridgel in 3 weeks for continued management.   Discharge Exam: Blood pressure 159/83, pulse 74, temperature 98.4 F (36.9 C), temperature source Oral, resp. rate 18, height 5\' 10"  (1.778 m), weight 129 lb 13.6 oz (58.9 kg), SpO2  91.00%.   Labs:   Lab Results  Component Value Date   WBC 4.5 03/04/2011   HGB 10.5* 03/04/2011   HCT 32.6* 03/04/2011   MCV 98.2 03/04/2011   PLT 174 03/04/2011    Lab 03/04/11 0500  NA 141  K 3.4*  CL 97  CO2 28  BUN 16  CREATININE 3.94*  CALCIUM 9.5  PROT --  BILITOT --  ALKPHOS --  ALT --  AST --  GLUCOSE 53*   Lab Results  Component Value Date   CKTOTAL 33 03/04/2011   CKMB 1.8 03/04/2011   TROPONINI <0.30 03/04/2011    Lab Results  Component Value Date   CHOL  Value: 125        ATP III CLASSIFICATION:  <200     mg/dL   Desirable  130-865  mg/dL   Borderline High  >=784    mg/dL   High        69/08/2950   Lab Results  Component Value Date   HDL 59 12/04/2008   Lab Results  Component Value Date   LDLCALC  Value: 48        Total Cholesterol/HDL:CHD Risk Coronary Heart Disease Risk Table                     Men   Women  1/2 Average Risk   3.4   3.3  Average Risk       5.0   4.4  2 X Average Risk   9.6   7.1  3 X Average Risk  23.4   11.0  Use the calculated Patient Ratio above and the CHD Risk Table to determine the patient's CHD Risk.        ATP III CLASSIFICATION (LDL):  <100     mg/dL   Optimal  621-308  mg/dL   Near or Above                    Optimal  130-159  mg/dL   Borderline  657-846  mg/dL   High  >962     mg/dL   Very High 95/04/8411   Lab Results  Component Value Date   TRIG 92 12/04/2008   Lab Results  Component Value Date   CHOLHDL 2.1 12/04/2008   No results found for this basename: LDLDIRECT      Radiology: X-ray Chest Pa And Lateral  03/04/2011  *RADIOLOGY REPORT*  Clinical Data: Shortness of breath and cough  CHEST - 2 VIEW  Comparison: 11/27/2010  Findings: Stable appearance of postoperative changes and cardiac pacemaker.  Borderline heart size and pulmonary vascularity. Emphysematous changes and scattered fibrosis in the lungs.  No focal airspace consolidation, representing improvement since previous study.  No blunting of costophrenic  angles.  No pneumothorax.  Old rib fractures.  IMPRESSION: Emphysematous changes and scattered fibrosis in the lungs.  No evidence of active consolidation.  Original Report Authenticated By: Marlon Pel, M.D.     FOLLOW UP PLANS AND APPOINTMENTS Discharge Orders    Future Appointments: Provider: Department: Dept Phone: Center:   03/14/2011 9:55 AM Amber Caryl Bis, RN [facility] [phone] [facility]   03/18/2011 10:15 AM Seth Bake Durene Cal, MD [facility] [phone] [facility]     Future Orders Please Complete By Expires   Diet - low sodium heart healthy      Increase activity slowly        Current Discharge Medication List    START taking these medications   Details  amiodarone (PACERONE) 400 MG tablet Take 1 tablet (400 mg total) by mouth 2 (two) times daily. Qty: 30 tablet, Refills: 1    aspirin EC 81 MG EC tablet Take 1 tablet (81 mg total) by mouth daily. Qty: 100 tablet, Refills: 3    carvedilol (COREG) 3.125 MG tablet Take 1 tablet (3.125 mg total) by mouth 2 (two) times daily with a meal. Qty: 60 tablet, Refills: 1    nitroGLYCERIN (NITROSTAT) 0.4 MG SL tablet Place 1 tablet (0.4 mg total) under the tongue every 5 (five) minutes as needed for chest pain. Qty: 30 tablet, Refills: 1      CONTINUE these medications which have CHANGED   Details  azaTHIOprine (IMURAN) 50 MG tablet Take 1.5 tablets (75 mg total) by mouth daily. Qty: 60 tablet, Refills: 1    azithromycin (ZITHROMAX) 250 MG tablet Take one tablet daily until finished. Qty: 6 each, Refills: 0    lanthanum (FOSRENOL) 1000 MG chewable tablet Chew 1 tablet (1,000 mg total) by mouth 3 (three) times daily with meals. Qty: 90 tablet, Refills: 2    levothyroxine (SYNTHROID, LEVOTHROID) 150 MCG tablet Take 1.5 tablets (225 mcg total) by mouth daily. Qty: 90 tablet, Refills: 1      CONTINUE these medications which have NOT CHANGED   Details  acetaminophen (TYLENOL) 325 MG tablet Take 650 mg by mouth  every 6 (six) hours as needed. For pain/fever     albuterol (PROVENTIL HFA;VENTOLIN HFA) 108 (90 BASE) MCG/ACT inhaler Inhale 2 puffs into the lungs every 4 (four) hours as needed. For shortness  of breath     B Complex-C-Folic Acid (B COMPLEX-VITAMIN C-FOLIC ACID) 1 MG tablet Take 1 tablet by mouth daily.     bimatoprost (LUMIGAN) 0.03 % ophthalmic solution Apply 1 drop to eye at bedtime.      Calcium Polycarbophil (EQUALACTIN PO) Take 2 tablets by mouth 2 (two) times daily.     cetirizine (ZYRTEC) 10 MG tablet Take 10 mg by mouth daily.      docusate sodium (COLACE) 100 MG capsule Take 100 mg by mouth 2 (two) times daily.      guaiFENesin (MUCINEX) 600 MG 12 hr tablet Take 1,200 mg by mouth 2 (two) times daily.      loperamide (IMODIUM) 2 MG capsule Take 2 mg by mouth as needed. For loose stool      Lutein 20 MG TABS Take 1 tablet by mouth daily.      midodrine (PROAMATINE) 5 MG tablet Take 5 mg by mouth See admin instructions. Take one tablet before dialysis and if blood pressure is less than 100     nizatidine (AXID) 150 MG capsule Take 150 mg by mouth at bedtime.      Omega-3 Fatty Acids (SALMON OIL-1000) 200 MG CAPS Take 200 mg by mouth 2 (two) times daily.      phenytoin (DILANTIN) 100 MG ER capsule Take 100 mg by mouth 3 (three) times daily.      predniSONE (DELTASONE) 5 MG tablet Take 5 mg by mouth daily.     rosuvastatin (CRESTOR) 5 MG tablet Take 5 mg by mouth daily.      tacrolimus (PROGRAF) 1 MG capsule Take 2 mg by mouth 2 (two) times daily.      vitamin C (ASCORBIC ACID) 500 MG tablet Take 500 mg by mouth 2 (two) times daily.        Time spent with patient to include physician time: 30 minutes Signed: Joni Reining 03/05/2011, 12:12 PM Co-Sign MD

## 2011-03-05 NOTE — Progress Notes (Signed)
Patient ID: [name], male   DOB: 1960-04-14, 51 y.o.   MRN: [id number] @ Subjective:  Denies SSCP, palpitations or Dyspnea   Objective:  Filed Vitals:   03/03/11 1922 03/04/11 1541 03/04/11 2103 03/05/11 0529  BP: 138/74 128/67 132/63 159/83  Pulse: 83 67 69 74  Temp: 98.5 F (36.9 C) 98.2 F (36.8 C) 97.8 F (36.6 C) 98.4 F (36.9 C)  TempSrc: Oral Oral Oral Oral  Resp: 18 18 18 18   Height: 5\' 10"  (1.778 m)     Weight: 56.5 kg (124 lb 9 oz)   58.9 kg (129 lb 13.6 oz)  SpO2: 98% 100% 94% 91%    Physical Exam: Affect appropriate Older than stated age HEENT: normal Neck supple with no adenopathy JVP normal no bruits no thyromegaly Lungs clear with no wheezing and good diaphragmatic motion Heart:  S1/S2 AS murmur with no ,rub, gallop or click PMI normal Abdomen: benighn, BS positve, no tenderness, no AAA no bruit.  No HSM or HJR Distal pulses  S/P RUKA   gortex fistula LUE/RUE and LLE No edema Neuro non-focal Skin warm and dry No muscular weakness Amputation digit left hand   Lab Results: Basic Metabolic Panel:  Basename 03/04/11 0500 03/03/11 2240  NA 141 140  K 3.4* 3.7  CL 97 98  CO2 28 33*  GLUCOSE 53* 71  BUN 16 11  CREATININE 3.94* 3.22*  CALCIUM 9.5 9.4  MG -- 2.2  PHOS -- --   Liver Function Tests: No results found for this basename: AST:2,ALT:2,ALKPHOS:2,BILITOT:2,PROT:2,ALBUMIN:2 in the last 72 hours No results found for this basename: LIPASE:2,AMYLASE:2 in the last 72 hours CBC:  Basename 03/04/11 0500 03/03/11 2240  WBC 4.5 4.0  NEUTROABS -- 2.6  HGB 10.5* 10.5*  HCT 32.6* 32.9*  MCV 98.2 97.1  PLT 174 196      Cardiac Studies:  ECG:  AV pacing RBBB morphoogy   Telemetry: AV pacing no VT   Medications:      . amiodarone  400 mg Oral BID  . aspirin  324 mg Oral NOW   Or  . aspirin  300 mg Rectal NOW  . aspirin EC  81 mg Oral Daily  . azaTHIOprine  75 mg Oral Daily  . azithromycin  250 mg Oral Daily  . bimatoprost  1  drop Both Eyes QHS  . carvedilol  3.125 mg Oral BID WC  . famotidine  20 mg Oral Daily  . guaiFENesin  1,200 mg Oral BID  . heparin  5,000 Units Subcutaneous Q8H  . lanthanum  1,000 mg Oral TID WC  . levothyroxine  225 mcg Oral Daily  . loratadine  10 mg Oral Daily  . multivitamin  1 tablet Oral QHS  . phenytoin  100 mg Oral TID  . predniSONE  5 mg Oral Daily  . rosuvastatin  5 mg Oral QHS  . sodium chloride  3 mL Intravenous Q12H  . tacrolimus  2 mg Oral BID  . vitamin C  500 mg Oral BID  . DISCONTD: tacrolimus  1 mg Oral BID       Assessment/Plan:  VT:  Interogation shows normal function with 2 ATP and one shock.  History of CABG.  No SSCP.   Per Dr Graciela Husbands no cath and start amiodarone.  400 bid for two weeks then 200 bid there after.  F/U GT  CRF:  Dialysis for Wendsday.  Labs including Mg and K ok DM.  Continue home meds CHF:  Last EF 45%  euvolemic on dialysis   CAD:  Stable no angina and enzymes negative  Access for a cath would be difficult.  Cannot use either arm or left leg Right leg S/P AKA and has had infected graft removed from this leg but I would use this access if needed in future  Charlton Haws 03/05/2011, 10:29 AM

## 2011-03-05 NOTE — Progress Notes (Signed)
Pt with nausea this am, Medication given for nausea monitored patient and pt feels much better, will discharge home as orderedkcrn

## 2011-03-06 ENCOUNTER — Encounter: Payer: Medicare Other | Admitting: *Deleted

## 2011-03-08 ENCOUNTER — Telehealth: Payer: Self-pay | Admitting: Physician Assistant

## 2011-03-08 NOTE — Telephone Encounter (Signed)
The patient called because he stated he did not have one of his medications he stated that [facility] pharmacy did not have the medication. He stated his wife forgot to go by [facility] drugs which did have the medication. He could not remember the name of the medication but eventually we figured out that it was amiodarone. The patient knew that there was a 24-hour [facility] in [facility] where he lives. I will call it in to this pharmacy and leave a message on his wife's cell phone to pick it up.

## 2011-03-10 ENCOUNTER — Other Ambulatory Visit: Payer: Self-pay | Admitting: Internal Medicine

## 2011-03-12 ENCOUNTER — Encounter: Payer: Self-pay | Admitting: Internal Medicine

## 2011-03-12 ENCOUNTER — Ambulatory Visit (INDEPENDENT_AMBULATORY_CARE_PROVIDER_SITE_OTHER): Payer: Medicare Other | Admitting: Internal Medicine

## 2011-03-12 DIAGNOSIS — I428 Other cardiomyopathies: Secondary | ICD-10-CM

## 2011-03-12 DIAGNOSIS — I5022 Chronic systolic (congestive) heart failure: Secondary | ICD-10-CM

## 2011-03-12 DIAGNOSIS — I499 Cardiac arrhythmia, unspecified: Secondary | ICD-10-CM

## 2011-03-12 DIAGNOSIS — Z95810 Presence of automatic (implantable) cardiac defibrillator: Secondary | ICD-10-CM

## 2011-03-12 LAB — ICD DEVICE OBSERVATION
AL THRESHOLD: 0.75 V
BAMS-0003: 70 {beats}/min
DEVICE MODEL ICD: 614310
FVT: 0
HV IMPEDENCE: 32 Ohm
PACEART VT: 0
RV LEAD AMPLITUDE: 11.9 mv
RV LEAD IMPEDENCE ICD: 237.5 Ohm
TOT-0009: 0
TOT-0010: 8
TZAT-0001FASTVT: 1
TZAT-0004SLOWVT: 8
TZAT-0012FASTVT: 200 ms
TZAT-0012SLOWVT: 200 ms
TZAT-0013SLOWVT: 4
TZAT-0018FASTVT: NEGATIVE
TZAT-0018SLOWVT: NEGATIVE
TZAT-0019FASTVT: 7.5 V
TZAT-0019SLOWVT: 7.5 V
TZAT-0020FASTVT: 1 ms
TZON-0003SLOWVT: 340 ms
TZON-0004FASTVT: 12
TZON-0004SLOWVT: 20
TZON-0005FASTVT: 6
TZST-0001FASTVT: 4
TZST-0001SLOWVT: 3
TZST-0001SLOWVT: 5
TZST-0003FASTVT: 30 J
TZST-0003FASTVT: 40 J

## 2011-03-12 NOTE — Progress Notes (Signed)
HPI Mr. Creech returns today for followup. 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 The patient is status post biventricular ICD implantation. Over the last year, his right ventricular lead has had worsening pacing thresholds. Today his pacing threshold was 5-1/2 V at 1.2 milliseconds. His heart failure remains between class II and class III. He was recently hospitalized with recurrent ventricular tachycardia and received multiple ICD therapies including a successful shock. He was begun on amiodarone. He complains of nausea. Allergies  Allergen Reactions  . Avelox (Moxifloxacin Hcl In Nacl)     Ask patient to clarify type of medication. Also need to know type/severity/reaction.  . Ceftriaxone Sodium Nausea And Vomiting  . Dilaudid (Hydromorphone Hcl)     sensitivity  . Protamine     Significant hypotension?  . Rocephin (Ceftriaxone Sodium In Dextrose)     Ask patient to clarify type of medication. Also need to know type/severity/reaction.  . Ciprofloxacin Rash  . Moxifloxacin Rash     Current Outpatient Prescriptions  Medication Sig Dispense Refill  . acetaminophen (TYLENOL) 325 MG tablet Take 650 mg by mouth every 6 (six) hours as needed. For pain/fever       . albuterol (PROVENTIL HFA;VENTOLIN HFA) 108 (90 BASE) MCG/ACT inhaler Inhale 2 puffs into the lungs every 4 (four) hours as needed. For shortness of breath       . amiodarone (PACERONE) 200 MG tablet Take 1 tablet (200 mg total) by mouth daily.  30 tablet  6  . amiodarone (PACERONE) 400 MG tablet Take 1 tablet (400 mg total) by mouth 2 (two) times daily.  30 tablet  1  . azaTHIOprine (IMURAN) 50 MG tablet Take 1.5 tablets (75 mg total) by mouth daily.  60 tablet  1  . B Complex-C-Folic Acid (B COMPLEX-VITAMIN C-FOLIC ACID) 1 MG  tablet Take 1 tablet by mouth daily.       . bimatoprost (LUMIGAN) 0.03 % ophthalmic solution Apply 1 drop to eye at bedtime.        . Calcium Polycarbophil (EQUALACTIN PO) Take 2 tablets by mouth 2 (two) times daily.       . carvedilol (COREG) 3.125 MG tablet Take 1 tablet (3.125 mg total) by mouth 2 (two) times daily with a meal.  60 tablet  1  . cetirizine (ZYRTEC) 10 MG tablet Take 10 mg by mouth daily.        [name] docusate sodium (COLACE) 100 MG capsule Take 100 mg by mouth 2 (two) times daily.        [name] guaiFENesin (MUCINEX) 600 MG 12 hr tablet Take 600 mg by mouth 2 (two) times daily.       [name] lanthanum (FOSRENOL) 1000 MG chewable tablet Chew 1 tablet (1,000 mg total) by mouth 3 (three) times daily with meals.  90 tablet  2  . levothyroxine (SYNTHROID, LEVOTHROID) 75 MCG tablet Take 75 mcg by mouth 3 (three) times daily.        [name] loperamide (IMODIUM) 2 MG capsule Take 1 mg by mouth 2 (two) times daily. For loose stool       . Lutein 20 MG TABS Take 1 tablet by mouth daily.        . midodrine (PROAMATINE) 5 MG tablet Take 5 mg by mouth See admin instructions. Take one tablet before dialysis and if blood pressure is  less than 100      . nitroGLYCERIN (NITROSTAT) 0.4 MG SL tablet Place 1 tablet (0.4 mg total) under the tongue every 5 (five) minutes as needed for chest pain.  30 tablet  1  . nizatidine (AXID) 150 MG capsule Take 150 mg by mouth at bedtime.        . Omega-3 Fatty Acids (SALMON OIL-1000) 200 MG CAPS Take 200 mg by mouth 2 (two) times daily.        . phenytoin (DILANTIN) 100 MG ER capsule Take 100 mg by mouth 3 (three) times daily.        . predniSONE (DELTASONE) 5 MG tablet Take 5 mg by mouth daily.       . rosuvastatin (CRESTOR) 5 MG tablet Take 5 mg by mouth every other day.       . tacrolimus (PROGRAF) 1 MG capsule Take 2 mg by mouth 2 (two) times daily.        . vitamin C (ASCORBIC ACID) 500 MG tablet Take 500 mg by mouth 2 (two) times daily.          Past Medical History    Diagnosis Date  . Secondary hyperparathyroidism, renal     In setting of ESRD.  [name] Hypothyroidism   . Automatic implantable cardiac defibrillator in situ     CRT-ICD   . Hypertension   . Nonischemic cardiomyopathy     2D-echocardiogram (08/2007) - LV EF 45%, akinesis of inferoseptal wall, inferior wall,   . ESRD on hemodialysis Started age 39yo    on MWF schedule. Previous failed renal transplant 1998 at [facility] (with rejection in 1998). 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 Resumed HD 10/2006.   [name] DM type 1 (diabetes mellitus, type 1) DX: age 65yo    previously well controlled with Aic 5 (12/2008)  . Intracranial hemorrhage     History of. On prophylactic dilantin.  . 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  . Hyperlipidemia   . Hemopericardium     History of in setting of failed radiogrequency ablation for Vtach  . Anemia of chronic disease     BL Hgb 11-13. In setting of ESRD.   [name] Peptic ulcer disease     Unknown history, no records per [employer].   . Cholelithiasis     Noted at least since 01/2006. Asymptomatic.  . Erectile dysfunction   . Peripheral autonomic neuropathy due to diabetes mellitus   . GERD (gastroesophageal reflux disease)   . Glaucoma   . Squamous cell skin cancer, finger     History of. Marginal resection in 05/2008 with recurrent infection/ lytic lesion involving distal phalanx  . Toe amputation status     Ischemic fourth and fifith toe left (03/2009), history of previous 1,2,3 toe  amputations.   [name] MRSA (methicillin resistant staph aureus) culture positive     Verify type - Per medical history form dated 02/13/10.  [name] Poor circulation     per medical history form dated 02/13/10.  . Ventricular tachycardia     appropriate ATP and Shocks 12/12  . RIATA LEAD     EXTERNALIZATION 12/12 (sk) // high RV threshold    ROS:   All  systems reviewed and negative except as noted in the HPI.   Past Surgical History  Procedure Date  . Cardiac defibrillator placement 11/05    St Jude  .  Transplant pancreatic allograft 2002  . Toe amputation 03/2009    left 4th, 5th toes. Previous 1,2, 3 toe left.  . Kidney transplant 2002  . 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 Hypertension Brother   . Hypertension Sister   . Coronary artery disease Mother     x 5 vessel graft in her 22s  . Hypertension Mother   . Prostate cancer Father   . Hypertension Father   . Hypertension Brother      History   Social History  . Marital Status: Married    Spouse Name: N/A    Number of Children: 0  . Years of Education: 12th grade   Occupational History  . UNEMPLOYED    Social History Main Topics  . Smoking status: Never Smoker   . Smokeless tobacco: Not on file  . Alcohol Use: No     previously drank heavily from 1980-1987  . Drug Use: No  . Sexually Active: Not on file   Other Topics Concern  . Not on file   Social History Narrative   The patient lives in [place] with his wife and stepdaughter.  He denies any tobacco use.  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      BP 108/66  Pulse 61  Ht 5' 10.5" (1.791 m)  Wt 57.335 kg (126 lb 6.4 oz)  BMI 17.88 kg/m2  Physical Exam:  Chronically ill appearing middle-aged man, NAD HEENT: Unremarkable Neck:  9 cm JVD, no thyromegally Lungs:  Clear except for rales in the bases bilaterally. No wheezes or rhonchi. Well-healed ICD incision. HEART:  Regular rate rhythm, no murmurs, no rubs, no clicks Abd:  soft, positive bowel sounds, no organomegally, no rebound, no guarding Ext:  Status post right above-the-knee amputation, no cyanosis, no clubbing Skin:  No rashes no nodules Neuro:  CN II through  XII intact, motor grossly intact   DEVICE  Normal device function.  See PaceArt for details. Elevated right ventricular pacing thresholds  Assess/Plan:

## 2011-03-12 NOTE — Assessment & Plan Note (Signed)
The pacing threshold on his right ventricular lead is increased. To maintain right ventricular capture, his ICD battery will last only 6 more months. Today I discussed the treatment options with the patient. My goal would be to try to increase his battery longevity. After much consideration, I thought the most appropriate approach would be to reduce the right ventricular pacing output to set threshold to maximize battery longevity. By pacing only the lateral wall of the left ventricle, there is potential that his heart failure could worsen. However there is at least theoretical potential that his heart failure will improve. If he worsens, we would reprogram his device with plans to revise his right ventricular lead and change out his generator in several months. If his symptoms are stable however, we will continue as we are foregoing the need to change out his device prematurely.

## 2011-03-12 NOTE — Patient Instructions (Signed)
Your physician wants you to follow-up in: 6 months with Dr Court Joy will receive a reminder letter in the mail two months in advance. If you don't receive a letter, please call our office to schedule the follow-up appointment.   Your physician has recommended you make the following change in your medication:  1) take Amiodarone 400mg  twice daily for one week, then decrease to 400mg  every night for one month, then decrease to 200mg  (1/2 tablet) every night thereafter

## 2011-03-12 NOTE — Assessment & Plan Note (Signed)
His symptoms are stable at present. If his heart failure worsens then we would consider adding a new right ventricular lead at the time of ICD generator change.

## 2011-03-12 NOTE — Assessment & Plan Note (Signed)
For now, he will continue his high-dose amiodarone reducing to 400 mg nightly in one week. He will continue at this dose for several months before decreasing his amiodarone to 200 or 300 mg daily thereafter.

## 2011-03-14 ENCOUNTER — Encounter: Payer: Medicare Other | Admitting: *Deleted

## 2011-03-15 ENCOUNTER — Encounter: Payer: Self-pay | Admitting: Surgery

## 2011-03-17 ENCOUNTER — Telehealth: Payer: Self-pay | Admitting: Adult Health

## 2011-03-17 NOTE — Telephone Encounter (Signed)
Mr. Coolman called stating that his breathing status has worsened since being seen by Dr. Ladona Ridgel on 03/12/2011. He has a BiV ICD, with right ventricular lead having worsening pacing thresholds.(Please see office note for more details). At the time of the office visit, Dr, Ladona Ridgel reduced the right ventricular pacing output to set threshold to maximize battery longevity.He stated that by pacing only the lateral wall of the left ventricle, there is potential that his heart failure could worsen.  He states that he is not having edema or PND, but keeps waking up at night for some unknown reason. Sitting up helps his symptoms.  I have advised him to come to ER if symptoms worsen. He wishes to avoid coming to ER. I will have our pacemaker clinic call him on Monday morning to have him come in to be seen. He is again, advised that if symptoms worsen to come to ER. He verbalizes understanding.

## 2011-03-18 ENCOUNTER — Encounter: Payer: Self-pay | Admitting: Surgery

## 2011-03-18 ENCOUNTER — Ambulatory Visit (INDEPENDENT_AMBULATORY_CARE_PROVIDER_SITE_OTHER): Payer: Medicare Other | Admitting: Surgery

## 2011-03-18 ENCOUNTER — Telehealth: Payer: Self-pay | Admitting: Internal Medicine

## 2011-03-18 VITALS — BP 143/70 | HR 60 | Resp 16 | Ht 70.0 in | Wt 126.0 lb

## 2011-03-18 DIAGNOSIS — L98499 Non-pressure chronic ulcer of skin of other sites with unspecified severity: Secondary | ICD-10-CM

## 2011-03-18 NOTE — Progress Notes (Signed)
[facility] [place]   Patient name: [name] MRN: [id number] DOB: 1961-02-18 Sex: male     Chief Complaint  Patient presents with  . Wound Check    Right AKA on 11-29-10    HISTORY OF PRESENT ILLNESS: The patient is back today for followup. He is status post right above-knee amputation on 11/09/2010. We have been dealing with a wound healing issue. He is no longer doing dressing changes. His wound is completely healed. He has no complaints at this time. He is recently in the hospital for a defibrillator issue and has not seen [employer] yet.  Past Medical History  Diagnosis Date  . Secondary hyperparathyroidism, renal     In setting of ESRD.  [name] Hypothyroidism   . Automatic implantable cardiac defibrillator in situ     CRT-ICD   . Hypertension   . Nonischemic cardiomyopathy     2D-echocardiogram (08/2007) - LV EF 45%, akinesis of inferoseptal wall, inferior wall,   . ESRD on hemodialysis Started age 70yo    on MWF schedule. Previous failed renal transplant 1998 at [facility] (with rejection in 1998). 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 Resumed HD 10/2006.   [name] DM type 1 (diabetes mellitus, type 1) DX: age 27yo    previously well controlled with Aic 5 (12/2008)  . Intracranial hemorrhage     History of. On prophylactic dilantin.  . 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  . Hyperlipidemia   . Hemopericardium     History of in setting of failed radiogrequency ablation for Vtach  . Anemia of chronic disease     BL Hgb 11-13. In setting of ESRD.   [name] Peptic ulcer disease     Unknown history, no records per [employer].   . Cholelithiasis     Noted at least since 01/2006. Asymptomatic.  . Erectile dysfunction   . Peripheral autonomic neuropathy due to diabetes mellitus   . GERD (gastroesophageal reflux disease)   . Glaucoma   .  Squamous cell skin cancer, finger     History of. Marginal resection in 05/2008 with recurrent infection/ lytic lesion involving distal phalanx  . Toe amputation status     Ischemic fourth and fifith toe left (03/2009), history of previous 1,2,3 toe  amputations.   [name] MRSA (methicillin resistant staph aureus) culture positive     Verify type - Per medical history form dated 02/13/10.  [name] Poor circulation     per medical history form dated 02/13/10.  . Ventricular tachycardia     appropriate ATP and Shocks 12/12  . RIATA LEAD     EXTERNALIZATION 12/12 (sk) // high RV threshold    Past Surgical History  Procedure Date  . Cardiac defibrillator placement 11/05    [employer]  . Transplant pancreatic allograft 2002  . Toe amputation 03/2009    left 4th, 5th toes. Previous 1,2, 3 toe left.  . Kidney transplant 2002  . Above knee leg amputation 11/09/10    Right AKA    History   Social History  . Marital Status: Married    Spouse Name: N/A    Number of Children: 0  . Years of Education: 12th grade   Occupational History  . UNEMPLOYED    Social History Main Topics  . Smoking status: Never Smoker   . Smokeless tobacco: Not on file  . Alcohol  Use: No     previously drank heavily from 1980-1987  . Drug Use: No  . Sexually Active: Not on file   Other Topics Concern  . Not on file   Social History Narrative   The patient lives in [place] with his wife and stepdaughter.  He denies any tobacco use.  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     Family History  Problem Relation Age of Onset  . Hypertension Brother   . Hypertension Sister   . Coronary artery disease Mother     x 5 vessel graft in her 11s  . Hypertension Mother   . Prostate cancer Father   . Hypertension Father   . Hypertension Brother     Allergies as of 03/18/2011 - Review Complete 03/18/2011    Allergen Reaction Noted  . Avelox (moxifloxacin hcl in nacl)  08/21/2010  . Ceftriaxone sodium Nausea And Vomiting   . Dilaudid (hydromorphone hcl)  03/03/2011  . Protamine  08/12/2010  . Rocephin (ceftriaxone sodium in dextrose)  08/21/2010  . Ciprofloxacin Rash   . Moxifloxacin Rash     Current Outpatient Prescriptions on File Prior to Visit  Medication Sig Dispense Refill  . acetaminophen (TYLENOL) 325 MG tablet Take 650 mg by mouth every 6 (six) hours as needed. For pain/fever       . albuterol (PROVENTIL HFA;VENTOLIN HFA) 108 (90 BASE) MCG/ACT inhaler Inhale 2 puffs into the lungs every 4 (four) hours as needed. For shortness of breath       . amiodarone (PACERONE) 200 MG tablet Take 1 tablet (200 mg total) by mouth daily.  30 tablet  6  . amiodarone (PACERONE) 400 MG tablet Take 1 tablet (400 mg total) by mouth 2 (two) times daily.  30 tablet  1  . azaTHIOprine (IMURAN) 50 MG tablet Take 1.5 tablets (75 mg total) by mouth daily.  60 tablet  1  . B Complex-C-Folic Acid (B COMPLEX-VITAMIN C-FOLIC ACID) 1 MG tablet Take 1 tablet by mouth daily.       . bimatoprost (LUMIGAN) 0.03 % ophthalmic solution Apply 1 drop to eye at bedtime.        . Calcium Polycarbophil (EQUALACTIN PO) Take 2 tablets by mouth 2 (two) times daily.       . carvedilol (COREG) 3.125 MG tablet Take 1 tablet (3.125 mg total) by mouth 2 (two) times daily with a meal.  60 tablet  1  . cetirizine (ZYRTEC) 10 MG tablet Take 10 mg by mouth daily.        [name] docusate sodium (COLACE) 100 MG capsule Take 100 mg by mouth 2 (two) times daily.        [name] guaiFENesin (MUCINEX) 600 MG 12 hr tablet Take 600 mg by mouth 2 (two) times daily.       [name] lanthanum (FOSRENOL) 1000 MG chewable tablet Chew 1 tablet (1,000 mg total) by mouth 3 (three) times daily with meals.  90 tablet  2  . levothyroxine (SYNTHROID, LEVOTHROID) 75 MCG tablet Take 75 mcg by mouth 3 (three) times daily.        [name] loperamide (IMODIUM) 2 MG capsule Take 1 mg by  mouth 2 (two) times daily. For loose stool       . Lutein 20 MG TABS Take 1 tablet by mouth daily.        . midodrine (  PROAMATINE) 5 MG tablet Take 5 mg by mouth See admin instructions. Take one tablet before dialysis and if blood pressure is less than 100      . nitroGLYCERIN (NITROSTAT) 0.4 MG SL tablet Place 1 tablet (0.4 mg total) under the tongue every 5 (five) minutes as needed for chest pain.  30 tablet  1  . nizatidine (AXID) 150 MG capsule Take 150 mg by mouth at bedtime.        . Omega-3 Fatty Acids (SALMON OIL-1000) 200 MG CAPS Take 200 mg by mouth 2 (two) times daily.        . phenytoin (DILANTIN) 100 MG ER capsule Take 100 mg by mouth 3 (three) times daily.        . predniSONE (DELTASONE) 5 MG tablet Take 5 mg by mouth daily.       . rosuvastatin (CRESTOR) 5 MG tablet Take 5 mg by mouth every other day.       . tacrolimus (PROGRAF) 1 MG capsule Take 2 mg by mouth 2 (two) times daily.        . vitamin C (ASCORBIC ACID) 500 MG tablet Take 500 mg by mouth 2 (two) times daily.          REVIEW OF SYSTEMS: No changes from prior visit except for recent hospitalization for defibrillator issues.  PHYSICAL EXAMINATION:   Vital signs are BP 143/70  Pulse 60  Resp 16  Ht 5\' 10"  (1.778 m)  Wt 126 lb (57.153 kg)  BMI 18.08 kg/m2  SpO2 99% General: The patient appears their stated age. HEENT:  No gross abnormalities Pulmonary:  Non labored breathing Musculoskeletal: Right above-knee amputation has completely healed. Neurologic: No focal weakness or paresthesias are detected, Skin: There are no ulcer or rashes noted. Psychiatric: The patient has normal affect.    Diagnostic Studies None  Assessment: Status post right above-knee amputation .Plan: The patient's wound is now completely healed. He will need to get into a stump shrinker and then be evaluated for a prosthesis. He has no active vascular issues at this time. He will contact me on a when necessary basis   V. Charlena Cross, M.D. [facility] [place] Office: [phone] Pager:  [phone]

## 2011-03-18 NOTE — Telephone Encounter (Signed)
New msg Pt was here last week and he wants to go over dosage of amiodarone and coreg. Please call

## 2011-03-18 NOTE — Telephone Encounter (Signed)
Explained to patient how to take his Amiodarone and to take both doses of Carvedilol

## 2011-05-31 ENCOUNTER — Telehealth: Payer: Self-pay | Admitting: Nurse Practitioner

## 2011-05-31 NOTE — Telephone Encounter (Signed)
Pt called this afternoon stating that sometime after dialysis today, he began to experience 2/10 chest discomfort that is worth with deep breathing.  He has no associated Ss.  Ss not reproducible with palpation of his chest.  He has NTG @ home and I rec that he sit down and take one (his last BP @ dialysis was 118/70 - he thinks).  If he gets no relief, he will come into the ED for evaluation.

## 2011-06-05 ENCOUNTER — Telehealth: Payer: Self-pay | Admitting: Internal Medicine

## 2011-06-05 NOTE — Telephone Encounter (Signed)
Spoke with patient  He is going to see if his dizziness is more on dialysis days and follow up with me in that regards.  He says it only last for a couple of seconds and its when he goes to lay down at night.  Dr Caryn Section gave him Naproxen for phantom pain and he does not take it daily but does use it occasionally

## 2011-06-05 NOTE — Telephone Encounter (Signed)
Pt was put on  naproxin is this ok to take? Also taking amiodarone and  corig from hospital, since then when he lays down he feels dizzy ,pls advise

## 2011-06-13 ENCOUNTER — Encounter: Payer: Self-pay | Admitting: Internal Medicine

## 2011-06-13 ENCOUNTER — Ambulatory Visit (INDEPENDENT_AMBULATORY_CARE_PROVIDER_SITE_OTHER): Payer: Medicare Other | Admitting: *Deleted

## 2011-06-13 DIAGNOSIS — I5022 Chronic systolic (congestive) heart failure: Secondary | ICD-10-CM

## 2011-06-13 DIAGNOSIS — I428 Other cardiomyopathies: Secondary | ICD-10-CM

## 2011-06-14 LAB — REMOTE ICD DEVICE
AL IMPEDENCE ICD: 430 Ohm
BAMS-0001: 150 {beats}/min
HV IMPEDENCE: 42 Ohm
LV LEAD IMPEDENCE ICD: 780 Ohm
RV LEAD AMPLITUDE: 5.9 mv
TZAT-0004FASTVT: 8
TZAT-0013FASTVT: 1
TZAT-0013SLOWVT: 4
TZAT-0018FASTVT: NEGATIVE
TZON-0003FASTVT: 280 ms
TZST-0001FASTVT: 3
TZST-0001SLOWVT: 2
TZST-0001SLOWVT: 5
TZST-0003FASTVT: 40 J
TZST-0003FASTVT: 40 J
TZST-0003SLOWVT: 30 J
TZST-0003SLOWVT: 40 J

## 2011-06-17 ENCOUNTER — Encounter: Payer: Self-pay | Admitting: Physician Assistant

## 2011-06-17 ENCOUNTER — Telehealth: Payer: Self-pay | Admitting: Internal Medicine

## 2011-06-17 NOTE — Telephone Encounter (Signed)
Spoke with pt, he has had three episodes of chest pain, relieved with NTG since jan. The last was this morning. The first was about one month ago, it can occur before or after dialysis. This am the discomfort was there when he woke up. It is always relieved with one NTG. He denies SOB. He has never had this type of pain before. He is pain free at present. Will discuss with dr Patty Sermons (DOD).

## 2011-06-17 NOTE — Telephone Encounter (Signed)
Discussed pt issues with scott weaver pac, appt made for pt to be seen tomorrow. He will go to the ER for anymore discomfort today. Pt voiced understanding.

## 2011-06-18 ENCOUNTER — Encounter: Payer: Self-pay | Admitting: Physician Assistant

## 2011-06-18 ENCOUNTER — Ambulatory Visit (INDEPENDENT_AMBULATORY_CARE_PROVIDER_SITE_OTHER): Payer: Medicare Other | Admitting: Physician Assistant

## 2011-06-18 ENCOUNTER — Ambulatory Visit (INDEPENDENT_AMBULATORY_CARE_PROVIDER_SITE_OTHER)
Admission: RE | Admit: 2011-06-18 | Discharge: 2011-06-18 | Disposition: A | Discharge status: Home / Self Care | Payer: Medicare Other | Source: Ambulatory Visit | Attending: Internal Medicine | Admitting: Internal Medicine

## 2011-06-18 VITALS — BP 109/53 | HR 60 | Ht 70.0 in | Wt 130.0 lb

## 2011-06-18 DIAGNOSIS — R079 Chest pain, unspecified: Secondary | ICD-10-CM

## 2011-06-18 DIAGNOSIS — I5022 Chronic systolic (congestive) heart failure: Secondary | ICD-10-CM

## 2011-06-18 DIAGNOSIS — I499 Cardiac arrhythmia, unspecified: Secondary | ICD-10-CM

## 2011-06-18 MED ORDER — IOHEXOL 300 MG/ML  SOLN
80.0000 mL | Freq: Once | INTRAMUSCULAR | Status: AC | PRN
  Administered 2011-06-18: 80 mL via INTRAVENOUS

## 2011-06-18 NOTE — Patient Instructions (Signed)
CHEST CT ANGIO W/CONTRAST FOR CHEST PAIN AND TO R/O PE THIS WILL BE DONE TODAY @ 3:30 PM BE HERE BY 3:15 PM   MAKE APPOINTMENT TO SEE DR. Raul Del IN June, 2013  NO MEDICATION CHANGES TODAY

## 2011-06-18 NOTE — Progress Notes (Signed)
[address] [place], [place]  [address] Phone: [phone] Fax:  [phone]  Date:  06/18/2011   Name:  [name]       DOB:  Aug 21, 1960 MRN:  [id number]  PCP:  Everlean Cherry, MD  Primary Cardiologist:  Dr. Lewayne Bunting  Primary Electrophysiologist:  Dr. Lewayne Bunting    History of Present Illness: [name] is a 51 y.o. male who presents for chest pain.  He has a history of nonischemic cardiomyopathy, systolic heart failure, ventricular tachycardia, status post CRT-D, hypertension and, diabetes, hyperlipidemia, hypothyroidism, end-stage renal disease on hemodialysis after failed renal transplant, anemia, GERD, status post right AKA.  He has undergone ventricular tachycardia ablation in the past.  His RV lead has had worsening pacing thresholds.  He was hospitalized at the end of last year with multiple ICD therapies and was begun on amiodarone at that time.  Cardiac cath was considered but the patient did not want to pursue.  In addition, access would be quite difficult with inability to use either arm or left leg and he is s/p right AKA.  LHC 01/2004: Mid to distal 20% LAD.  Last echo 03/2007 EF 45%, infero-septal akinesis, inferior akinesis, Doppler parameters consistent with restrictive physiology, mild aortic stenosis, mean gradient 13, moderate MAC, moderate LAE.  He awoke with dull left sided chest pain yesterday.  The pain has persisted until today.  [name] radiation, assoc diaphoresis or nausea or dyspnea.  [name] orthopnea, PND or edema.  He is fairly sedentary.  [name] syncope.  [name] ICD therapies.  [name] change in chest pain with changes in positioning.  [name] cough or hemoptysis.  He does note pleuritic quality to his CP.  He had similar symptoms last week.  He is getting to his EDW and goes to dialysis on MWF.    Past Medical History  Diagnosis Date  . Secondary hyperparathyroidism, renal     In setting of ESRD.  [name] Hypothyroidism   . Automatic implantable  cardiac defibrillator in situ     CRT-ICD   . Hypertension   . Nonischemic cardiomyopathy     2D-echocardiogram (08/2007) - LV EF 45%, akinesis of inferoseptal wall, inferior wall,   . ESRD on hemodialysis Started age 28yo    on MWF schedule. Previous failed renal transplant 1998 at [facility] (with rejection in 1998). 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 Resumed HD 10/2006.   [name] DM type 1 (diabetes mellitus, type 1) DX: age 38yo    previously well controlled with Aic 5 (12/2008)  . Intracranial hemorrhage     History of. On prophylactic dilantin.  . 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  . Hyperlipidemia   . Hemopericardium     History of in setting of failed radiogrequency ablation for Vtach  . Anemia of chronic disease     BL Hgb 11-13. In setting of ESRD.   [name] Peptic ulcer disease     Unknown history, [name] records per [employer].   . Cholelithiasis     Noted at least since 01/2006. Asymptomatic.  . Erectile dysfunction   . Peripheral autonomic neuropathy due to diabetes mellitus   . GERD (gastroesophageal reflux disease)   . Glaucoma   . Squamous cell skin cancer, finger     History of. Marginal resection in 05/2008 with recurrent infection/ lytic lesion involving distal phalanx  .  Toe amputation status     Ischemic fourth and fifith toe left (03/2009), history of previous 1,2,3 toe  amputations.   [name] MRSA (methicillin resistant staph aureus) culture positive     Verify type - Per medical history form dated 02/13/10.  [name] Poor circulation     per medical history form dated 02/13/10.  . Ventricular tachycardia     appropriate ATP and Shocks 12/12  . RIATA LEAD     EXTERNALIZATION 12/12 (sk) // high RV threshold  . S/P BKA (below knee amputation)     right    Current Outpatient Prescriptions  Medication Sig Dispense Refill  . acetaminophen (TYLENOL)  325 MG tablet Take 650 mg by mouth every 6 (six) hours as needed. For pain/fever       . albuterol (PROVENTIL HFA;VENTOLIN HFA) 108 (90 BASE) MCG/ACT inhaler Inhale 2 puffs into the lungs every 4 (four) hours as needed. For shortness of breath       . amiodarone (PACERONE) 200 MG tablet Take 1 tablet (200 mg total) by mouth daily.  30 tablet  6  . azaTHIOprine (IMURAN) 50 MG tablet Take 1.5 tablets (75 mg total) by mouth daily.  60 tablet  1  . B Complex-C-Folic Acid (B COMPLEX-VITAMIN C-FOLIC ACID) 1 MG tablet Take 1 tablet by mouth daily.       . bimatoprost (LUMIGAN) 0.03 % ophthalmic solution Apply 1 drop to eye at bedtime.        . Calcium Polycarbophil (EQUALACTIN PO) Take 2 tablets by mouth 2 (two) times daily.       . carvedilol (COREG) 3.125 MG tablet Take 1 tablet (3.125 mg total) by mouth 2 (two) times daily with a meal.  60 tablet  1  . cetirizine (ZYRTEC) 10 MG tablet Take 10 mg by mouth daily.        [name] docusate sodium (COLACE) 100 MG capsule Take 100 mg by mouth 2 (two) times daily.        [name] guaiFENesin (MUCINEX) 600 MG 12 hr tablet Take 600 mg by mouth 2 (two) times daily.       [name] lanthanum (FOSRENOL) 1000 MG chewable tablet Chew 1 tablet (1,000 mg total) by mouth 3 (three) times daily with meals.  90 tablet  2  . levothyroxine (SYNTHROID, LEVOTHROID) 75 MCG tablet Take 75 mcg by mouth 3 (three) times daily.        [name] loperamide (IMODIUM) 2 MG capsule Take 1 mg by mouth 2 (two) times daily. For loose stool       . Lutein 20 MG TABS Take 1 tablet by mouth daily.        . midodrine (PROAMATINE) 5 MG tablet Take 5 mg by mouth See admin instructions. Take one tablet before dialysis and if blood pressure is less than 100      . nitroGLYCERIN (NITROSTAT) 0.4 MG SL tablet Place 1 tablet (0.4 mg total) under the tongue every 5 (five) minutes as needed for chest pain.  30 tablet  1  . nizatidine (AXID) 150 MG capsule Take 150 mg by mouth at bedtime.        . Omega-3 Fatty Acids (SALMON  OIL-1000) 200 MG CAPS Take 200 mg by mouth 2 (two) times daily.        [name] oxyCODONE (OXY IR/ROXICODONE) 5 MG immediate release tablet As needed for pain      . phenytoin (DILANTIN) 100 MG ER capsule Take 100 mg by mouth 3 (three) times  daily.        . predniSONE (DELTASONE) 5 MG tablet Take 5 mg by mouth daily.       . rosuvastatin (CRESTOR) 5 MG tablet Take 5 mg by mouth every other day.       . tacrolimus (PROGRAF) 1 MG capsule Take 2 mg by mouth 2 (two) times daily.        . vitamin C (ASCORBIC ACID) 500 MG tablet Take 500 mg by mouth 2 (two) times daily.       [name] DISCONTD: amiodarone (PACERONE) 400 MG tablet Take 1 tablet (400 mg total) by mouth 2 (two) times daily.  30 tablet  1    Allergies: Allergies  Allergen Reactions  . Avelox (Moxifloxacin Hcl In Nacl)     Ask patient to clarify type of medication. Also need to know type/severity/reaction.  . Ceftriaxone Sodium Nausea And Vomiting  . Dilaudid (Hydromorphone Hcl)     sensitivity  . Protamine     Significant hypotension?  . Rocephin (Ceftriaxone Sodium In Dextrose)     Ask patient to clarify type of medication. Also need to know type/severity/reaction.  . Ciprofloxacin Rash  . Moxifloxacin Rash    History  Substance Use Topics  . Smoking status: Never Smoker   . Smokeless tobacco: Not on file  . Alcohol Use: [name]     previously drank heavily from 1980-1987     ROS:  Please see the history of present illness.   All other systems reviewed and negative.   PHYSICAL EXAM: VS:  BP 109/53  Pulse 60  Ht 5\' 10"  (1.778 m)  Wt 130 lb (58.968 kg)  BMI 18.65 kg/m2 Chronically ill appearing male in [name] acute distress HEENT: normal Neck: [name] JVD at 90 degrees Cardiac:  normal S1, S2; RRR; 2/6 systolic murmur RUSB Lungs:  Crackles noted in bases bilaterally, [name] wheezing, rhonchi  Abd: soft, nontender, [name] hepatomegaly Ext: [name] edema; right AKA noted  Skin: warm and dry Neuro:  CNs 2-12 intact, [name] focal abnormalities noted  EKG:   V paced, HR 65  Device interrogated:  RV lead previously turned off; device functioning appropriately  ASSESSMENT AND PLAN:  1. Chest pain, unspecified  I am most concerned about pulmonary embolism.  His O2 sat is 99 % on RA.  He had a V/Q last year that was indeterminate.  His next dialysis session is tomorrow.  Arrange chest CTA to rule out pulmonary embolism.  He is somewhat hesitant to proceed.  We discussed the dangers of not treating a pulmonary embolism (ultimately, death).  He understands.  I d/w Dr. Lewayne Bunting.  I did not feel pursuing an ischemic evaluation is necessary.  He has [name] good access for LHC.  Therefore, I do not think he would benefit from a nuclear study.  Dr. Lewayne Bunting agreed.  In addition, his current symptoms to not seem to represent ACS.   2. Chronic systolic heart failure  Volume management per dialysis.  Current volume status appears stable.     3. Ventricular arrhythmia  [name] recurrence noted on device interrogation.  He will follow up with Dr. Lewayne Bunting in 08/2011.       Luna Glasgow, PA-C  12:06 PM 06/18/2011

## 2011-06-20 ENCOUNTER — Encounter: Payer: Self-pay | Admitting: *Deleted

## 2011-06-20 NOTE — Progress Notes (Signed)
Remote icd check w/icm  

## 2011-06-28 ENCOUNTER — Telehealth: Payer: Self-pay

## 2011-06-28 DIAGNOSIS — R52 Pain, unspecified: Secondary | ICD-10-CM

## 2011-06-28 DIAGNOSIS — M79662 Pain in left lower leg: Secondary | ICD-10-CM

## 2011-06-28 NOTE — Telephone Encounter (Signed)
Pt. Called to report episode of left calf pain on 06/27/11.  States his left calf was so painful, he couldn't stand to have it touched or for clothing to rub against it.  Denies any redness or warmth.  Denies any swelling in left lower extremity/or foot.  Describes pain as a "burning" sensation.  Asking if nurse could explain what it could have been?  Pt. Stated he was evaluated at the [facility] ER last night.  Stated that "they couldn't explain it".  Reports having ultrasound done, and was told there is no blood clot.  Denies any other symptoms, ie: numbness/tingling or change in color/ temperature of left lower extremity.  Voiced concern about of there is reoccurance of pain.  Informed pt. Nurse cannot explan what caused calf pain.  Offered appt. To see Dr. Hermenia Bers w/ plan.  Will d/w Dr. Myra Gianotti re: need for vascular studies associated w/ MD appt.

## 2011-07-01 NOTE — Telephone Encounter (Signed)
Have pt see me with arterial duplex of leg ----- Message ----- From: Erenest Blank, RN Sent: 06/28/2011 10:09 AM To: Nada Libman, MD Subject: order for vascular study   Copied from telephone encounter/ please advise re: vascular labs:  Pt. called to report episode of left calf pain on 06/27/11. States his left calf was so painful, he couldn't stand to have it touched, or for clothing to rub against it. Denies any redness or warmth. Denies any swelling in left lower extremity/or foot. Describes pain as a "burning" sensation. Asking if nurse could explain what it could have been? Pt. stated he was evaluated at the [facility] ER last night. Stated that "they couldn't explain it". Reports having ultrasound done, and was told there is no blood clot. Denies any other symptoms, ie: numbness/tingling or change in color/ temperature of left lower extremity. Voiced concern about if there is reoccurance of pain. Informed pt. nurse cannot explan what caused calf pain. Offered appt. to see Dr. Hermenia Bers w/ plan. Will d/w Dr. Myra Gianotti re: need for vascular studies associated w/ MD appt.

## 2011-07-04 ENCOUNTER — Telehealth: Payer: Self-pay | Admitting: Internal Medicine

## 2011-07-04 ENCOUNTER — Encounter: Payer: Self-pay | Admitting: Surgery

## 2011-07-04 NOTE — Telephone Encounter (Signed)
Please return call to patient at [phone] after 12 noon, to discuss generic medication for :  amiodarone (PACERONE) 200 MG tablet  carvedilol (COREG) 3.125 MG tablet

## 2011-07-04 NOTE — Telephone Encounter (Signed)
Fu call Pt called back again about this issue

## 2011-07-04 NOTE — Telephone Encounter (Signed)
Okay to have generic for both medications

## 2011-07-08 ENCOUNTER — Ambulatory Visit (INDEPENDENT_AMBULATORY_CARE_PROVIDER_SITE_OTHER): Payer: Medicare Other | Admitting: Surgery

## 2011-07-08 ENCOUNTER — Encounter: Payer: Self-pay | Admitting: Surgery

## 2011-07-08 ENCOUNTER — Encounter (INDEPENDENT_AMBULATORY_CARE_PROVIDER_SITE_OTHER): Payer: Medicare Other | Admitting: *Deleted

## 2011-07-08 VITALS — BP 148/62 | HR 61 | Resp 16 | Ht 70.0 in | Wt 131.0 lb

## 2011-07-08 DIAGNOSIS — I739 Peripheral vascular disease, unspecified: Secondary | ICD-10-CM

## 2011-07-08 DIAGNOSIS — M79662 Pain in left lower leg: Secondary | ICD-10-CM

## 2011-07-08 DIAGNOSIS — R52 Pain, unspecified: Secondary | ICD-10-CM

## 2011-07-08 NOTE — Progress Notes (Signed)
[facility] [place]   Patient name: [name] MRN: [id number] DOB: [birth year] Sex: male     Chief Complaint  Patient presents with  . Follow-up    left foot small ulcer bottom of foot pad    HISTORY OF PRESENT ILLNESS: The patient comes in today with concerns over problems with his left leg. He is well known to me having undergone amputation of his right leg. He is also undergone toe amputations on the left. Last week he was seen in the emergency department for severe pain in his calf which didn't spontaneously resolve. He has noted a callous as well as an ulcer on the distal end of his left foot which has been there for approximately 3 weeks. He states that it is getting smaller. He continues to be medically managed for his diabetes. He does have a left thigh graft for dialysis.  Past Medical History  Diagnosis Date  . Secondary hyperparathyroidism, renal     In setting of ESRD.  [name] Hypothyroidism   . Automatic implantable cardiac defibrillator in situ     CRT-ICD   . Hypertension   . Nonischemic cardiomyopathy     2D-echocardiogram (08/2007) - LV EF 45%, akinesis of inferoseptal wall, inferior wall,   . ESRD on hemodialysis Started age 51yo    on MWF schedule. Previous failed renal transplant 1998 at [facility] (with rejection in 1998). 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 Resumed HD 10/2006.   [name] DM type 1 (diabetes mellitus, type 1) DX: age 51yo    previously well controlled with Aic 5 (12/2008)  . Intracranial hemorrhage     History of. On prophylactic dilantin.  . 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  . Hyperlipidemia   . Hemopericardium     History of in setting of failed radiogrequency ablation for Vtach  . Anemia of chronic disease     BL Hgb 11-13. In setting of ESRD.   [name] Peptic ulcer disease     Unknown history,  no records per [employer].   . Cholelithiasis     Noted at least since 01/2006. Asymptomatic.  . Erectile dysfunction   . Peripheral autonomic neuropathy due to diabetes mellitus   . GERD (gastroesophageal reflux disease)   . Glaucoma   . Squamous cell skin cancer, finger     History of. Marginal resection in 05/2008 with recurrent infection/ lytic lesion involving distal phalanx  . Toe amputation status     Ischemic fourth and fifith toe left (03/2009), history of previous 1,2,3 toe  amputations.   [name] MRSA (methicillin resistant staph aureus) culture positive     Verify type - Per medical history form dated 02/13/10.  [name] Poor circulation     per medical history form dated 02/13/10.  . Ventricular tachycardia     appropriate ATP and Shocks 12/12  . RIATA LEAD     EXTERNALIZATION 12/12 (sk) // high RV threshold  . S/P BKA (below knee amputation)     right    Past Surgical History  Procedure Date  . Cardiac defibrillator placement 11/05    St Jude  . Transplant pancreatic allograft 2002  . Toe amputation 03/2009    left 4th, 5th toes. Previous 1,2, 3 toe left.  . Kidney transplant 2002  . Above knee leg amputation 11/09/10    Right AKA    History  Social History  . Marital Status: Married    Spouse Name: N/A    Number of Children: 0  . Years of Education: 12th grade   Occupational History  . UNEMPLOYED    Social History Main Topics  . Smoking status: Never Smoker   . Smokeless tobacco: Not on file  . Alcohol Use: No     previously drank heavily from 1980-1987  . Drug Use: No  . Sexually Active: Not on file   Other Topics Concern  . Not on file   Social History Narrative   The patient lives in [place] with his wife and stepdaughter.  He denies any tobacco use.  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     Family History  Problem Relation Age of Onset  .  Hypertension Brother   . Hypertension Sister   . Coronary artery disease Mother     x 5 vessel graft in her 68s  . Hypertension Mother   . Prostate cancer Father   . Hypertension Father   . Hypertension Brother     Allergies as of 07/08/2011 - Review Complete 07/08/2011  Allergen Reaction Noted  . Avelox (moxifloxacin hcl in nacl)  08/21/2010  . Ceftriaxone sodium Nausea And Vomiting   . Dilaudid (hydromorphone hcl)  03/03/2011  . Protamine  08/12/2010  . Rocephin (ceftriaxone sodium in dextrose)  08/21/2010  . Ciprofloxacin Rash   . Moxifloxacin Rash     Current Outpatient Prescriptions on File Prior to Visit  Medication Sig Dispense Refill  . acetaminophen (TYLENOL) 325 MG tablet Take 650 mg by mouth every 6 (six) hours as needed. For pain/fever       . albuterol (PROVENTIL HFA;VENTOLIN HFA) 108 (90 BASE) MCG/ACT inhaler Inhale 2 puffs into the lungs every 4 (four) hours as needed. For shortness of breath       . amiodarone (PACERONE) 200 MG tablet Take 1 tablet (200 mg total) by mouth daily.  30 tablet  6  . azaTHIOprine (IMURAN) 50 MG tablet Take 1.5 tablets (75 mg total) by mouth daily.  60 tablet  1  . B Complex-C-Folic Acid (B COMPLEX-VITAMIN C-FOLIC ACID) 1 MG tablet Take 1 tablet by mouth daily.       . bimatoprost (LUMIGAN) 0.03 % ophthalmic solution Apply 1 drop to eye at bedtime.        . Calcium Polycarbophil (EQUALACTIN PO) Take 2 tablets by mouth 2 (two) times daily.       . carvedilol (COREG) 3.125 MG tablet Take 1 tablet (3.125 mg total) by mouth 2 (two) times daily with a meal.  60 tablet  1  . cetirizine (ZYRTEC) 10 MG tablet Take 10 mg by mouth daily.        [name] docusate sodium (COLACE) 100 MG capsule Take 100 mg by mouth 2 (two) times daily.        [name] guaiFENesin (MUCINEX) 600 MG 12 hr tablet Take 600 mg by mouth 2 (two) times daily.       [name] lanthanum (FOSRENOL) 1000 MG chewable tablet Chew 1 tablet (1,000 mg total) by mouth 3 (three) times daily with meals.  90  tablet  2  . levothyroxine (SYNTHROID, LEVOTHROID) 75 MCG tablet Take 75 mcg by mouth 3 (three) times daily.        [name] loperamide (IMODIUM) 2 MG capsule Take 1 mg by mouth  2 (two) times daily. For loose stool       . Lutein 20 MG TABS Take 1 tablet by mouth daily.        . midodrine (PROAMATINE) 5 MG tablet Take 5 mg by mouth See admin instructions. Take one tablet before dialysis and if blood pressure is less than 100      . nitroGLYCERIN (NITROSTAT) 0.4 MG SL tablet Place 1 tablet (0.4 mg total) under the tongue every 5 (five) minutes as needed for chest pain.  30 tablet  1  . nizatidine (AXID) 150 MG capsule Take 150 mg by mouth at bedtime.        . Omega-3 Fatty Acids (SALMON OIL-1000) 200 MG CAPS Take 200 mg by mouth 2 (two) times daily.        [name] oxyCODONE (OXY IR/ROXICODONE) 5 MG immediate release tablet As needed for pain      . phenytoin (DILANTIN) 100 MG ER capsule Take 100 mg by mouth 3 (three) times daily.        . predniSONE (DELTASONE) 5 MG tablet Take 5 mg by mouth daily.       . rosuvastatin (CRESTOR) 5 MG tablet Take 5 mg by mouth every other day.       . tacrolimus (PROGRAF) 1 MG capsule Take 2 mg by mouth 2 (two) times daily.        . vitamin C (ASCORBIC ACID) 500 MG tablet Take 500 mg by mouth 2 (two) times daily.          REVIEW OF SYSTEMS: Positive for pain in legs with walking and temporary loss of vision. Please see symptoms and history of present illness. Otherwise negative  PHYSICAL EXAMINATION:   Vital signs are BP 148/62  Pulse 61  Resp 16  Ht 5\' 10"  (1.778 m)  Wt 131 lb (59.421 kg)  BMI 18.80 kg/m2 General: The patient appears their stated age. HEENT:  No gross abnormalities Pulmonary:  Non labored breathing Musculoskeletal: Right leg is surgically absent Neurologic: No focal weakness or paresthesias are detected, Skin: Small 2 mm ulcer on the distal end of the left forefoot no evidence of infection Psychiatric: The patient has normal  affect. Cardiovascular: There is a regular rate and rhythm without significant murmur appreciated. Left pedal pulse nonpalpable   Diagnostic Studies Ultrasound was ordered and reviewed today this shows no areas of stenosis.  Assessment: New left foot ulcer Plan: This is certainly a concerning problem given the patient's history. However at this point in time I do not feel that surgical intervention or percutaneous intervention is warranted. His ultrasound shows no evidence of stenosis within his vascular bed. Of course he does have a left thigh graft which certainly is decreasing blood flow to his left leg. However, he does state that the wound has gotten smaller recently. I am going to refer him to the wound center for possible hyperbaric treatments to see if this can get his wound to heal without invasive interventions. Commence see him back in 6 weeks to see out is doing. I told him that if the wound was getting bigger we have her current pain to contact me and I would see him sooner.  [name], M.D. [facility] [place] Office: [phone] Pager:  [phone]

## 2011-07-18 ENCOUNTER — Encounter (HOSPITAL_BASED_OUTPATIENT_CLINIC_OR_DEPARTMENT_OTHER): Payer: Medicare Other | Attending: Internal Medicine

## 2011-07-18 DIAGNOSIS — Z940 Kidney transplant status: Secondary | ICD-10-CM | POA: Insufficient documentation

## 2011-07-18 DIAGNOSIS — Z9483 Pancreas transplant status: Secondary | ICD-10-CM | POA: Insufficient documentation

## 2011-07-18 DIAGNOSIS — Z8614 Personal history of Methicillin resistant Staphylococcus aureus infection: Secondary | ICD-10-CM | POA: Insufficient documentation

## 2011-07-18 DIAGNOSIS — S68118A Complete traumatic metacarpophalangeal amputation of other finger, initial encounter: Secondary | ICD-10-CM | POA: Insufficient documentation

## 2011-07-18 DIAGNOSIS — Z992 Dependence on renal dialysis: Secondary | ICD-10-CM | POA: Insufficient documentation

## 2011-07-18 DIAGNOSIS — S78119A Complete traumatic amputation at level between unspecified hip and knee, initial encounter: Secondary | ICD-10-CM | POA: Insufficient documentation

## 2011-07-18 DIAGNOSIS — N186 End stage renal disease: Secondary | ICD-10-CM | POA: Insufficient documentation

## 2011-07-18 DIAGNOSIS — D638 Anemia in other chronic diseases classified elsewhere: Secondary | ICD-10-CM | POA: Insufficient documentation

## 2011-07-18 DIAGNOSIS — L97509 Non-pressure chronic ulcer of other part of unspecified foot with unspecified severity: Secondary | ICD-10-CM | POA: Insufficient documentation

## 2011-07-18 DIAGNOSIS — H409 Unspecified glaucoma: Secondary | ICD-10-CM | POA: Insufficient documentation

## 2011-07-18 DIAGNOSIS — S98139A Complete traumatic amputation of one unspecified lesser toe, initial encounter: Secondary | ICD-10-CM | POA: Insufficient documentation

## 2011-07-18 DIAGNOSIS — K219 Gastro-esophageal reflux disease without esophagitis: Secondary | ICD-10-CM | POA: Insufficient documentation

## 2011-07-18 DIAGNOSIS — I120 Hypertensive chronic kidney disease with stage 5 chronic kidney disease or end stage renal disease: Secondary | ICD-10-CM | POA: Insufficient documentation

## 2011-07-18 DIAGNOSIS — L84 Corns and callosities: Secondary | ICD-10-CM | POA: Insufficient documentation

## 2011-07-18 DIAGNOSIS — E1069 Type 1 diabetes mellitus with other specified complication: Secondary | ICD-10-CM | POA: Insufficient documentation

## 2011-07-18 NOTE — Progress Notes (Signed)
[facility]  NAME:  [name], [name]:  [id number]  MEDICAL RECORD NO.:  [id number]      DATE OF BIRTH:  1960-03-26  PHYSICIAN:  Maxwell Caul, M.D. VISIT DATE:  07/18/2011                                  OFFICE VISIT   LOCATION:  [facility]  CHIEF COMPLAINT:  Review of his wound on his plantar left foot.  HISTORY:  [name] is a gentleman who comes to [place] courtesy of Dr. Myra Gianotti of vascular surgery.  He is a complicated patient who ended up with a right above-the-knee amputation, I believe, in September 2012. He has also had previous amputations of all of his toes on the left foot.  He has recently been noticed to have a callus as well as an ulcer on the distal end of his left foot, and he comes to see [place] today due to this.  He tells [place] that the area actually started as a blister a month ago, it is then opened and then ulcerated, and since then, he thinks it is actually getting better.  The patient is a type 1 diabetic with a history of kidney and pancreas transplant.  PAST MEDICAL HISTORY:  Extensive and includes: 1. End-stage renal failure, on dialysis and secondary     hyperparathyroidism, hypothyroidism, he has an implantable cardiac     defibrillator, hypertension,  nonischemic cardiomyopathy with an EF     of 45%. 2. End-stage renal disease, on dialysis with previous failed renal     transplant in 1998 at [facility], Kidney and pancreas transplant in 2002. 3. Type 1 diabetes. 4. History of an intracranial hemorrhage. 5. Left 3rd finger partial amputation due to squamous cell carcinoma. 6. Anemia of chronic disease. 7. Peptic ulcer disease. 8. Cholelithiasis. 9. History of peripheral and autonomic neuropathy. 10.Gastroesophageal reflux disease. 11.Glaucoma. 12.Multiple toe amputations on the left dating back to 2011. 13.MRSA infections. 14.PAD. 15.History of ventricular tachycardia.  PAST SURGICAL  HISTORY: 1. Cardiac defibrillator placement, St. Jude. 2. Transplant pancreas allograft. 3. Toe amputations of the 4th and 5th and then previously 1st, 2nd,     and 3rd. 4. Kidney transplant. 5. Above-knee amputation in September 2012.  EXAMINATION:  VITAL SIGNS: Temperature is 98.3, pulse 64, respirations 18, blood pressure 139/70.  RESPIRATORY: Clear air entry bilaterally. CARDIAC: Heart sounds are normal.  There is a 3/6 systolic ejection murmur that does not radiate.  WOUND EXAM:  There is a small ulcer on the base of his left plantar foot.  This I think is improved from his previous description of this. The base of this is epithelialized, there is no evidence of surrounding infection.  IMPRESSIONS: 1. Wegener's grade II, left foot ulcer.  The patient actually thinks     this is better, and I think by description of previous notes, it     probably is.  The fact that he has an ulcer with circumferential     callus means there is an adequate offloading, he comes in with     basic running shoe.  I had visions of actually off-loading this     further; however, he is traveling to [place] next week in any of my     options probably would  not be that easy to do while he is     traveling.  In any case, we dressed this with foam for offloading     with gauze and tape.  We gave him a prescription for consideration     of diabetic footwear for the remaining left foot.  We will see him     when he comes back from his trip probably in 2 weeks.  He really does not seem particularly concerned about the status of this wound, which was a bit strange given his history.  Particularly the offloading issue really did not seem to face him too much.  I do not think this wound is going to require hyperbaric oxygen, I have my doubts he would agree to the logistic issues surrounding initiation of hyperbaric oxygen even if he would qualify for it.  I will see him again in 2 weeks.           ______________________________ Maxwell Caul, M.D.     MGR/MEDQ  D:  07/18/2011  T:  07/18/2011  Job:  [id number]

## 2011-07-22 NOTE — Procedures (Unsigned)
VASCULAR LAB EXAM  INDICATION:  Evaluation of recent complaints of left calf pain  HISTORY: Diabetes:  No Cardiac: Hypertension:  Yes The patient recently had a duplex exam to rule out left lower extremity DVT which was negative.  EXAM:  Patent left lower extremity arterial system with biphasic waveforms noted throughout. Left thigh dialysis access site appears patent with a velocity of 759 cm/s noted at the anastomosis.  IMPRESSION: 1. Ankle brachial indices were not obtained due to right above knee     amputation and previously noted left noncompressible vessels. 2. Biphasic waveforms noted throughout the left lower extremity     arterial system.  ___________________________________________ V. Charlena Cross, MD  EM/MEDQ  D:  07/08/2011  T:  07/08/2011  Job:  [id number]

## 2011-08-08 ENCOUNTER — Encounter (HOSPITAL_BASED_OUTPATIENT_CLINIC_OR_DEPARTMENT_OTHER): Payer: Medicare Other | Attending: Internal Medicine

## 2011-08-08 DIAGNOSIS — S68118A Complete traumatic metacarpophalangeal amputation of other finger, initial encounter: Secondary | ICD-10-CM | POA: Insufficient documentation

## 2011-08-08 DIAGNOSIS — I120 Hypertensive chronic kidney disease with stage 5 chronic kidney disease or end stage renal disease: Secondary | ICD-10-CM | POA: Insufficient documentation

## 2011-08-08 DIAGNOSIS — Z940 Kidney transplant status: Secondary | ICD-10-CM | POA: Insufficient documentation

## 2011-08-08 DIAGNOSIS — L97509 Non-pressure chronic ulcer of other part of unspecified foot with unspecified severity: Secondary | ICD-10-CM | POA: Insufficient documentation

## 2011-08-08 DIAGNOSIS — Z992 Dependence on renal dialysis: Secondary | ICD-10-CM | POA: Insufficient documentation

## 2011-08-08 DIAGNOSIS — Z9483 Pancreas transplant status: Secondary | ICD-10-CM | POA: Insufficient documentation

## 2011-08-08 DIAGNOSIS — N186 End stage renal disease: Secondary | ICD-10-CM | POA: Insufficient documentation

## 2011-08-08 DIAGNOSIS — E785 Hyperlipidemia, unspecified: Secondary | ICD-10-CM | POA: Insufficient documentation

## 2011-08-08 DIAGNOSIS — I739 Peripheral vascular disease, unspecified: Secondary | ICD-10-CM | POA: Insufficient documentation

## 2011-08-08 DIAGNOSIS — S98139A Complete traumatic amputation of one unspecified lesser toe, initial encounter: Secondary | ICD-10-CM | POA: Insufficient documentation

## 2011-08-08 DIAGNOSIS — E1169 Type 2 diabetes mellitus with other specified complication: Secondary | ICD-10-CM | POA: Insufficient documentation

## 2011-08-09 ENCOUNTER — Encounter: Payer: Self-pay | Admitting: Surgery

## 2011-08-12 ENCOUNTER — Telehealth: Payer: Self-pay | Admitting: Surgery

## 2011-08-12 ENCOUNTER — Ambulatory Visit: Payer: Medicare Other | Admitting: Surgery

## 2011-08-12 NOTE — Telephone Encounter (Signed)
The patient called wondering why he needs to be seen by Dr. Myra Gianotti. I informed him that it was a 6 week f/u from his appointment in May. He stated' "I don't understand why I need to follow up with Dr. Myra Gianotti since the wound care is following me... Don't doctors trust each other?" He cancelled the appointment and said that if he has any issues, then he will call to reschedule.

## 2011-08-19 ENCOUNTER — Ambulatory Visit: Payer: Medicare Other | Admitting: Surgery

## 2011-08-22 ENCOUNTER — Encounter (HOSPITAL_BASED_OUTPATIENT_CLINIC_OR_DEPARTMENT_OTHER): Payer: Medicare Other

## 2011-09-12 ENCOUNTER — Encounter (HOSPITAL_BASED_OUTPATIENT_CLINIC_OR_DEPARTMENT_OTHER): Payer: Medicare Other | Attending: Internal Medicine

## 2011-09-12 DIAGNOSIS — I739 Peripheral vascular disease, unspecified: Secondary | ICD-10-CM | POA: Insufficient documentation

## 2011-09-12 DIAGNOSIS — L97509 Non-pressure chronic ulcer of other part of unspecified foot with unspecified severity: Secondary | ICD-10-CM | POA: Insufficient documentation

## 2011-09-12 DIAGNOSIS — E1169 Type 2 diabetes mellitus with other specified complication: Secondary | ICD-10-CM | POA: Insufficient documentation

## 2011-10-01 ENCOUNTER — Telehealth: Payer: Self-pay | Admitting: Internal Medicine

## 2011-10-01 NOTE — Telephone Encounter (Signed)
Pt calling to say that the pharmacy called him to say there is an interaction between tacrolimus and the amiodarone he's taking, can the amiodarone be changed or should he get the tacrolimus changed?  pls call [phone]

## 2011-10-01 NOTE — Telephone Encounter (Signed)
Dr Ladona Ridgel said Amiodarone can not be changed that the Dr prescribing the other drug should be contacted.  Spoke to pt and he said it raises the drug level in the blood work but he had it checked on the 24th and that level was normal and he was told it would be fine.

## 2011-10-03 ENCOUNTER — Encounter (HOSPITAL_BASED_OUTPATIENT_CLINIC_OR_DEPARTMENT_OTHER): Payer: Medicare Other | Attending: Internal Medicine

## 2011-10-03 DIAGNOSIS — L97509 Non-pressure chronic ulcer of other part of unspecified foot with unspecified severity: Secondary | ICD-10-CM | POA: Insufficient documentation

## 2011-10-03 DIAGNOSIS — E1169 Type 2 diabetes mellitus with other specified complication: Secondary | ICD-10-CM | POA: Insufficient documentation

## 2011-10-10 ENCOUNTER — Encounter (HOSPITAL_BASED_OUTPATIENT_CLINIC_OR_DEPARTMENT_OTHER): Payer: Medicare Other

## 2011-10-22 ENCOUNTER — Ambulatory Visit (INDEPENDENT_AMBULATORY_CARE_PROVIDER_SITE_OTHER): Payer: Medicare Other | Admitting: Internal Medicine

## 2011-10-22 ENCOUNTER — Encounter: Payer: Self-pay | Admitting: Internal Medicine

## 2011-10-22 VITALS — BP 112/64 | HR 67 | Ht 70.5 in | Wt 136.8 lb

## 2011-10-22 DIAGNOSIS — Z95810 Presence of automatic (implantable) cardiac defibrillator: Secondary | ICD-10-CM

## 2011-10-22 DIAGNOSIS — I499 Cardiac arrhythmia, unspecified: Secondary | ICD-10-CM

## 2011-10-22 DIAGNOSIS — I428 Other cardiomyopathies: Secondary | ICD-10-CM

## 2011-10-22 DIAGNOSIS — I5022 Chronic systolic (congestive) heart failure: Secondary | ICD-10-CM

## 2011-10-22 LAB — ICD DEVICE OBSERVATION
AL AMPLITUDE: 3.6 mv
AL IMPEDENCE ICD: 462.5 Ohm
DEVICE MODEL ICD: 614310
FVT: 0
HV IMPEDENCE: 41 Ohm
LV LEAD IMPEDENCE ICD: 812.5 Ohm
LV LEAD THRESHOLD: 0.75 V
RV LEAD AMPLITUDE: 2.7 mv
RV LEAD IMPEDENCE ICD: 225 Ohm
TOT-0007: 1
TOT-0008: 0
TOT-0009: 0
TZAT-0004FASTVT: 8
TZAT-0004SLOWVT: 8
TZAT-0012SLOWVT: 200 ms
TZAT-0013FASTVT: 1
TZAT-0013SLOWVT: 4
TZAT-0018FASTVT: NEGATIVE
TZAT-0019FASTVT: 7.5 V
TZON-0003FASTVT: 280 ms
TZON-0003SLOWVT: 340 ms
TZON-0004FASTVT: 12
TZON-0010FASTVT: 40 ms
TZST-0001FASTVT: 2
TZST-0001FASTVT: 3
TZST-0001FASTVT: 4
TZST-0001SLOWVT: 2
TZST-0001SLOWVT: 3
TZST-0001SLOWVT: 5
TZST-0003FASTVT: 40 J
TZST-0003FASTVT: 40 J
TZST-0003SLOWVT: 30 J
VENTRICULAR PACING ICD: 98 pct
VF: 0

## 2011-10-22 NOTE — Assessment & Plan Note (Signed)
His St. Jude defibrillator is working normally. We'll plan to recheck in several months. 

## 2011-10-22 NOTE — Assessment & Plan Note (Signed)
His congestive heart failure is stable. He will continue his current medical therapy, and maintain a low-sodium diet.

## 2011-10-22 NOTE — Assessment & Plan Note (Signed)
He has had no recurrent ventricular tachycardia or fibrillation. He will continue his amiodarone therapy.

## 2011-10-22 NOTE — Patient Instructions (Signed)
Your physician wants you to follow-up in: 6 months with Dr Court Joy will receive a reminder letter in the mail two months in advance. If you don't receive a letter, please call our office to schedule the follow-up appointment.  Remote monitoring is used to monitor your Pacemaker of ICD from home. This monitoring reduces the number of office visits required to check your device to one time per year. It allows [place] to keep an eye on the functioning of your device to ensure it is working properly. You are scheduled for a device check from home on 01/27/12. You may send your transmission at any time that day. If you have a wireless device, the transmission will be sent automatically. After your physician reviews your transmission, you will receive a postcard with your next transmission date.

## 2011-10-22 NOTE — Progress Notes (Signed)
HPI [name] returns today for followup. He is a very pleasant 51 year old man with multiple medical problems including end-stage renal disease on hemodialysis, nonischemic cardiomyopathy, chronic systolic heart failure, ventricular tachycardia, status post biventricular ICD implantation. To control his ventricular tachycardia, he is been maintained on amiodarone. His main complaint today has been anorexia. This is been present for 3 weeks. He denies any weight loss. He denies worsening shortness of breath. No recent ICD shocks. Allergies  Allergen Reactions  . Avelox (Moxifloxacin Hcl In Nacl)     Ask patient to clarify type of medication. Also need to know type/severity/reaction.  . Ceftriaxone Sodium Nausea And Vomiting  . Dilaudid (Hydromorphone Hcl)     sensitivity  . Protamine     Significant hypotension?  . Rocephin (Ceftriaxone Sodium In Dextrose)     Ask patient to clarify type of medication. Also need to know type/severity/reaction.  . Ciprofloxacin Rash  . Moxifloxacin Rash     Current Outpatient Prescriptions  Medication Sig Dispense Refill  . acetaminophen (TYLENOL) 325 MG tablet Take 650 mg by mouth every 6 (six) hours as needed. For pain/fever       . albuterol (PROVENTIL HFA;VENTOLIN HFA) 108 (90 BASE) MCG/ACT inhaler Inhale 2 puffs into the lungs every 4 (four) hours as needed. For shortness of breath       . amiodarone (PACERONE) 200 MG tablet Take 1 tablet (200 mg total) by mouth daily.  30 tablet  6  . amoxicillin-clavulanate (AUGMENTIN) 500-125 MG per tablet Take 1 tablet by mouth 2 (two) times daily.       [name] azaTHIOprine (IMURAN) 50 MG tablet Take 1.5 tablets (75 mg total) by mouth daily.  60 tablet  1  . B Complex-C-Folic Acid (B COMPLEX-VITAMIN C-FOLIC ACID) 1 MG tablet Take 1 tablet by mouth daily.       . bimatoprost (LUMIGAN) 0.03 % ophthalmic solution Apply 1 drop to eye at bedtime.        . Calcium Polycarbophil (EQUALACTIN PO) Take 2 tablets by mouth 2 (two)  times daily.       . carvedilol (COREG) 3.125 MG tablet Take 1 tablet (3.125 mg total) by mouth 2 (two) times daily with a meal.  60 tablet  1  . cetirizine (ZYRTEC) 10 MG tablet Take 10 mg by mouth daily.        [name] docusate sodium (COLACE) 100 MG capsule Take 100 mg by mouth 2 (two) times daily.        [name] guaiFENesin (MUCINEX) 600 MG 12 hr tablet Take 600 mg by mouth 2 (two) times daily as needed.       [name] lanthanum (FOSRENOL) 1000 MG chewable tablet Chew 1 tablet (1,000 mg total) by mouth 3 (three) times daily with meals.  90 tablet  2  . loperamide (IMODIUM) 2 MG capsule Take 1 mg by mouth 2 (two) times daily. For loose stool       . Lutein 20 MG TABS Take 1 tablet by mouth daily.        . midodrine (PROAMATINE) 5 MG tablet Take 5 mg by mouth See admin instructions. Take one tablet before dialysis and if blood pressure is less than 100      . nitroGLYCERIN (NITROSTAT) 0.4 MG SL tablet Place 1 tablet (0.4 mg total) under the tongue every 5 (five) minutes as needed for chest pain.  30 tablet  1  . nizatidine (AXID) 150 MG capsule Take 150 mg by mouth at bedtime.        [name]  Omega-3 Fatty Acids (SALMON OIL-1000) 200 MG CAPS Take 200 mg by mouth 2 (two) times daily.        . phenytoin (DILANTIN) 100 MG ER capsule Take 100 mg by mouth 3 (three) times daily.        . predniSONE (DELTASONE) 5 MG tablet Take 5 mg by mouth daily.       . rosuvastatin (CRESTOR) 5 MG tablet Take 5 mg by mouth every other day.       . tacrolimus (PROGRAF) 1 MG capsule Take 2 mg by mouth 2 (two) times daily.        . vitamin C (ASCORBIC ACID) 500 MG tablet Take 500 mg by mouth 2 (two) times daily.          Past Medical History  Diagnosis Date  . Secondary hyperparathyroidism, renal     In setting of ESRD.  [name] Hypothyroidism   . Automatic implantable cardiac defibrillator in situ     CRT-ICD   . Hypertension   . Nonischemic cardiomyopathy     2D-echocardiogram (08/2007) - LV EF 45%, akinesis of inferoseptal wall,  inferior wall,   . ESRD on hemodialysis Started age 30yo    on MWF schedule. Previous failed renal transplant 1998 at [facility] (with rejection in 1998). 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 Resumed HD 10/2006.   [name] DM type 1 (diabetes mellitus, type 1) DX: age 25yo    previously well controlled with Aic 5 (12/2008)  . Intracranial hemorrhage     History of. On prophylactic dilantin.  . 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  . Hyperlipidemia   . Hemopericardium     History of in setting of failed radiogrequency ablation for Vtach  . Anemia of chronic disease     BL Hgb 11-13. In setting of ESRD.   [name] Peptic ulcer disease     Unknown history, no records per [employer].   . Cholelithiasis     Noted at least since 01/2006. Asymptomatic.  . Erectile dysfunction   . Peripheral autonomic neuropathy due to diabetes mellitus   . GERD (gastroesophageal reflux disease)   . Glaucoma   . Squamous cell skin cancer, finger     History of. Marginal resection in 05/2008 with recurrent infection/ lytic lesion involving distal phalanx  . Toe amputation status     Ischemic fourth and fifith toe left (03/2009), history of previous 1,2,3 toe  amputations.   [name] MRSA (methicillin resistant staph aureus) culture positive     Verify type - Per medical history form dated 02/13/10.  [name] Poor circulation     per medical history form dated 02/13/10.  . Ventricular tachycardia     appropriate ATP and Shocks 12/12  . RIATA LEAD     EXTERNALIZATION 12/12 (sk) // high RV threshold  . S/P BKA (below knee amputation)     right    ROS:   All systems reviewed and negative except as noted in the HPI.   Past Surgical History  Procedure Date  . Cardiac defibrillator placement 11/05    St Jude  . Transplant pancreatic allograft 2002  . Toe amputation 03/2009    left 4th, 5th toes.  Previous 1,2, 3 toe left.  . Kidney transplant 2002  . Above knee leg amputation 11/09/10    Right AKA     Family History  Problem Relation Age  of Onset  . Hypertension Brother   . Hypertension Sister   . Coronary artery disease Mother     x 5 vessel graft in her 13s  . Hypertension Mother   . Prostate cancer Father   . Hypertension Father   . Hypertension Brother      History   Social History  . Marital Status: Married    Spouse Name: N/A    Number of Children: 0  . Years of Education: 12th grade   Occupational History  . UNEMPLOYED    Social History Main Topics  . Smoking status: Never Smoker   . Smokeless tobacco: Not on file  . Alcohol Use: No     previously drank heavily from 1980-1987  . Drug Use: No  . Sexually Active: Not on file   Other Topics Concern  . Not on file   Social History Narrative   The patient lives in [place] with his wife and stepdaughter.  He denies any tobacco use.  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      BP 112/64  Pulse 67  Ht 5' 10.5" (1.791 m)  Wt 136 lb 12.8 oz (62.052 kg)  BMI 19.35 kg/m2  Physical Exam:  Chronically ill appearing middle-aged man, NAD HEENT: Unremarkable Neck:  No JVD, no thyromegally Lungs:  Clear with no wheezes, rales, or rhonchi. HEART:  Regular rate rhythm, no murmurs, no rubs, no clicks Abd:  soft, positive bowel sounds, no organomegally, no rebound, no guarding Ext:  2 plus pulses, no edema, no cyanosis, no clubbing Skin:  No rashes no nodules Neuro:  CN II through XII intact, motor grossly intact  DEVICE  Normal device function.  See PaceArt for details.   Assess/Plan

## 2011-11-07 ENCOUNTER — Other Ambulatory Visit: Payer: Self-pay | Admitting: Dermatology

## 2011-11-14 ENCOUNTER — Telehealth: Payer: Self-pay | Admitting: Internal Medicine

## 2011-11-14 NOTE — Telephone Encounter (Signed)
Pt describes highs and lows with his BP on dialysis days , pt wanted to know how to adjust coreg and amiodarone. Pt takes those meds 12p and 12a, post dialysis on M/W/F his bp drops around 2pm (pt took meds at noon) and then prior to dialysis on some days it as high as 180/101 but other times its normal. Advised pt to take afternoon meds a little later after dialysis and after he eats and drinks. Pt told to watch diet on the days BP is high and see if he can trace it back to high sodium intake that day, elevated bp is reduced after dialysis but spike may be diet induced.  Pt agreed to plan as described, told to call with further questions or concerns.

## 2011-11-14 NOTE — Telephone Encounter (Signed)
plz return pt call regarding med instructions, [phone]

## 2011-12-24 ENCOUNTER — Telehealth: Payer: Self-pay | Admitting: Internal Medicine

## 2011-12-24 NOTE — Telephone Encounter (Signed)
Patient called stated he forgot to take amiodarone last night,normally takes at 12:00 mid night every night,so he took at 6:00 am today.Advised ok to take at 12:00 midnight tonight like he normally does.

## 2011-12-24 NOTE — Telephone Encounter (Signed)
New problem:  Did not take amiodarone last night, took around 6 am this am. Please advise on what time he should take the next appt.

## 2012-01-08 ENCOUNTER — Telehealth: Payer: Self-pay | Admitting: Internal Medicine

## 2012-01-08 NOTE — Telephone Encounter (Signed)
Spoke with patient and answered his questions in regards to his Carvedilol

## 2012-01-08 NOTE — Telephone Encounter (Signed)
Please return call to pt [phone] regarding Coreg medication dosage clarification

## 2012-01-27 ENCOUNTER — Ambulatory Visit (INDEPENDENT_AMBULATORY_CARE_PROVIDER_SITE_OTHER): Payer: Federal, State, Local not specified - PPO | Admitting: *Deleted

## 2012-01-27 ENCOUNTER — Encounter: Payer: Self-pay | Admitting: Internal Medicine

## 2012-01-27 DIAGNOSIS — I428 Other cardiomyopathies: Secondary | ICD-10-CM

## 2012-01-27 DIAGNOSIS — Z95810 Presence of automatic (implantable) cardiac defibrillator: Secondary | ICD-10-CM

## 2012-01-27 DIAGNOSIS — I5022 Chronic systolic (congestive) heart failure: Secondary | ICD-10-CM

## 2012-01-28 LAB — REMOTE ICD DEVICE
AL IMPEDENCE ICD: 530 Ohm
BAMS-0003: 70 {beats}/min
HV IMPEDENCE: 41 Ohm
RV LEAD AMPLITUDE: 7.1 mv
TZAT-0001FASTVT: 1
TZAT-0001SLOWVT: 1
TZAT-0004SLOWVT: 8
TZAT-0012FASTVT: 200 ms
TZAT-0018SLOWVT: NEGATIVE
TZAT-0019FASTVT: 7.5 V
TZAT-0019SLOWVT: 7.5 V
TZAT-0020FASTVT: 1 ms
TZAT-0020SLOWVT: 1 ms
TZON-0003SLOWVT: 340 ms
TZON-0004FASTVT: 12
TZON-0004SLOWVT: 20
TZON-0005FASTVT: 6
TZON-0005SLOWVT: 6
TZON-0010FASTVT: 40 ms
TZON-0010SLOWVT: 40 ms
TZST-0001FASTVT: 2
TZST-0001FASTVT: 4
TZST-0001FASTVT: 5
TZST-0001SLOWVT: 3
TZST-0001SLOWVT: 4
TZST-0003FASTVT: 30 J
TZST-0003FASTVT: 40 J
TZST-0003SLOWVT: 20 J
TZST-0003SLOWVT: 40 J

## 2012-02-12 ENCOUNTER — Encounter: Payer: Self-pay | Admitting: *Deleted

## 2012-02-14 ENCOUNTER — Telehealth: Payer: Self-pay | Admitting: Internal Medicine

## 2012-02-14 NOTE — Telephone Encounter (Signed)
plz return call to pt [phone] to discuss medication

## 2012-02-17 NOTE — Telephone Encounter (Signed)
Spoke with patient.  He is concerned because his BP has been dropping while in dialysis.  It returns to normal after they give him 100cc of Saline.  I have tried to reassure him that this is ok and that if they felt it was a problem they would call Dr Ladona Ridgel and ask that he be seen.  I have encouraged him to keep his regular follow up and if they feel at any time he needs to see the doctor we will work him in

## 2012-02-24 ENCOUNTER — Telehealth: Payer: Self-pay | Admitting: Internal Medicine

## 2012-02-24 NOTE — Telephone Encounter (Signed)
New Problem:    Patient called in wanting to know if taking carvedilol (COREG) 3.125 MG tablet would have an effect on his dialysis.  Patient reports that on his dialysis days his BP dorps low and they have to stop for about an hour before it rises to a point where they can begin again.  Please call back.

## 2012-02-24 NOTE — Telephone Encounter (Signed)
He is going to hold Carvedilol on Tues/Thurs/Sun to help with his BP control during dialysis  He will let me know if this helps or if he needs to take anything else to help with his HR

## 2012-03-11 ENCOUNTER — Ambulatory Visit (INDEPENDENT_AMBULATORY_CARE_PROVIDER_SITE_OTHER): Payer: Medicare Other | Admitting: Surgery

## 2012-03-11 ENCOUNTER — Encounter (INDEPENDENT_AMBULATORY_CARE_PROVIDER_SITE_OTHER): Payer: Self-pay | Admitting: Surgery

## 2012-03-11 VITALS — BP 142/70 | HR 76 | Temp 97.6°F | Resp 18 | Ht 70.52 in | Wt 135.6 lb

## 2012-03-11 DIAGNOSIS — K529 Noninfective gastroenteritis and colitis, unspecified: Secondary | ICD-10-CM | POA: Insufficient documentation

## 2012-03-11 DIAGNOSIS — K8010 Calculus of gallbladder with chronic cholecystitis without obstruction: Secondary | ICD-10-CM | POA: Insufficient documentation

## 2012-03-11 DIAGNOSIS — Z89619 Acquired absence of unspecified leg above knee: Secondary | ICD-10-CM | POA: Insufficient documentation

## 2012-03-11 DIAGNOSIS — R197 Diarrhea, unspecified: Secondary | ICD-10-CM

## 2012-03-11 DIAGNOSIS — C23 Malignant neoplasm of gallbladder: Secondary | ICD-10-CM

## 2012-03-11 DIAGNOSIS — Z9483 Pancreas transplant status: Secondary | ICD-10-CM

## 2012-03-11 HISTORY — DX: Calculus of gallbladder with chronic cholecystitis without obstruction: K80.10

## 2012-03-11 NOTE — Progress Notes (Signed)
Subjective:     Patient ID: [name], male   DOB: 07/19/1960, 52 y.o.   MRN: [id number]  HPI  [name]  [birth year] [id number]  Patient Care Team: Lorin Picket. Martha Clan as PCP - General (Family Medicine) Garnetta Buddy, MD (Nephrology) Marinus Maw, MD as Consulting Physician (Cardiology) Webb Silversmith as Consulting Physician (Gastroenterology)  This patient is a 52 y.o.male who presents today for surgical evaluation at the request of Dr. Webb Silversmith [facility].   Reason for evaluation: Gallbladder mass.  Concern of cancer.  Pleasant male with numerous health issues.  Renal failure.  Gets hemodialysis Monday Wednesday Friday throughout left thigh AV shunt.  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  Pancreas transplant still works.  Kidney transplant failed.  Had a prior right nephrectomy.  Chronic heart failure followed by Dr. Lewayne Bunting.  Has pacemaker.  Diabetes.  Has had had amputations of numerous digits.  Has had a right above-the-knee amputation as well.  In a wheelchair  Has had episodes of postprandial nausea and bloating.  Some reflux-like belching as well.  Associated with most foods.  This happened a few months ago in September.  Gradually improve.  Has some chronic diarrhea usually controlled with Imodium.  That has been stable.  His abdominal complaints decreased.  Underwent a CAT scan.  Found to have a mass in his gallbladder.  Ultrasound was concerning for a polyp-like mass.  Not c/w gallstone.  Malignancy could not be ruled out.  No evidence of liver disease or metastases.  His had a CT scan in 2010 and that mentioned a large gallstone.  Based on concerns he was sent to gastroenterology who after discussing with the patient's nephrologist recommended surgical valuation.  Patient is eating better now.  Weight has been stable.  No personal nor family history of GI/colon cancer, inflammatory bowel disease, irritable bowel  syndrome, allergy such as Celiac Sprue, dietary/dairy problems, colitis, ulcers nor gastritis.  No recent sick contacts/gastroenteritis.  No travel outside the country.  No changes in diet.    Patient Active Problem List  Diagnosis  . HYPOTHYROIDISM  . HYPERTENSION, UNSPECIFIED  . Chronic systolic heart failure  . End stage renal disease  . TACHYCARDIA  . CHEST PAIN-PAINFUL RESPIRATIONS  . HYPERPARATHYROIDISM, HX OF  . ICD-CRT  St Judes  . Diabetes mellitus  . Wears glasses/contacts  . Glaucoma/retinopathy  . MRSA (methicillin resistant staph aureus) culture positive  . Kidney disease/stone  . Difficulty urinating  . Atherosclerosis of native arteries of the extremities with ulceration  . Ventricular arrhythmia  . Nonischemic cardiomyopathy  . RIATA LEAD  . Circulation problem  . S/P Right AKA (above knee amputation)  . H/O pancreas/kidney transplant 2002 [facility]  . 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 Secondary hyperparathyroidism, renal     In setting of ESRD.  [name] Hypothyroidism   . Automatic implantable cardiac defibrillator in situ     CRT-ICD   . Hypertension   . Nonischemic cardiomyopathy     2D-echocardiogram (08/2007) - LV EF 45%, akinesis of inferoseptal wall, inferior wall,   . ESRD on hemodialysis Started age 52yo    on MWF schedule. Previous failed renal transplant 1998 at [facility] (with rejection in 1998). 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 Resumed HD 10/2006.   [name] DM type 1 (diabetes mellitus, type 1) DX: age  52yo    previously well controlled with Aic 5 (12/2008)  . Intracranial hemorrhage     History of. On prophylactic dilantin.  . 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  . Hyperlipidemia   . Hemopericardium     History of in setting of failed radiogrequency ablation  for Vtach  . Anemia of chronic disease     BL Hgb 11-13. In setting of ESRD.   [name] Peptic ulcer disease     Unknown history, no records per [employer].   . Cholelithiasis     Noted at least since 01/2006. Asymptomatic.  . Erectile dysfunction   . Peripheral autonomic neuropathy due to diabetes mellitus   . GERD (gastroesophageal reflux disease)   . Glaucoma(365)   . Squamous cell skin cancer, finger     History of. Marginal resection in 05/2008 with recurrent infection/ lytic lesion involving distal phalanx  . Toe amputation status     Ischemic fourth and fifith toe left (03/2009), history of previous 1,2,3 toe  amputations.   [name] MRSA (methicillin resistant staph aureus) culture positive     Verify type - Per medical history form dated 02/13/10.  [name] Poor circulation     per medical history form dated 02/13/10.  . Ventricular tachycardia     appropriate ATP and Shocks 12/12  . RIATA LEAD     EXTERNALIZATION 12/12 (sk) // high RV threshold  . S/P BKA (below knee amputation)     right  . Arthritis   . Blood transfusion without reported diagnosis   . Heart murmur     Past Surgical History  Procedure Date  . Cardiac defibrillator placement 11/05    St Jude  . Transplant pancreatic allograft 2002  . Toe amputation 03/2009    left 4th, 5th toes. Previous 1,2, 3 toe left.  . Kidney transplant 2002  . Above knee leg amputation 11/09/10    Right AKA    History   Social History  . Marital Status: Married    Spouse Name: N/A    Number of Children: 0  . Years of Education: 12th grade   Occupational History  . UNEMPLOYED    Social History Main Topics  . Smoking status: Never Smoker   . Smokeless tobacco: Never Used  . 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 Drug Use: No  . Sexually Active: Not on file   Other Topics Concern  . Not on file   Social History Narrative   The patient lives in [place] with his wife and stepdaughter.  He denies any  tobacco use.  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     Family History  Problem Relation Age of Onset  . Hypertension Brother   . Hypertension Sister   . Coronary artery disease Mother     x 5 vessel graft in her 5s  . Hypertension Mother   . Prostate cancer Father   . Hypertension Father   . Cancer Father     Prostate  . Hypertension Brother     Current Outpatient Prescriptions  Medication Sig Dispense Refill  . acetaminophen (TYLENOL) 325 MG tablet Take 650 mg by mouth every 6 (six) hours as needed. For pain/fever       . albuterol (  PROVENTIL HFA;VENTOLIN HFA) 108 (90 BASE) MCG/ACT inhaler Inhale 2 puffs into the lungs every 4 (four) hours as needed. For shortness of breath       . amiodarone (PACERONE) 200 MG tablet Take 1 tablet (200 mg total) by mouth daily.  30 tablet  6  . azaTHIOprine (IMURAN) 50 MG tablet Take 50 mg by mouth daily. 1 1/2 tablet      . bimatoprost (LUMIGAN) 0.03 % ophthalmic solution Apply 1 drop to eye at bedtime.        . Calcium Polycarbophil (EQUALACTIN PO) Take 2 tablets by mouth 2 (two) times daily.       . cetirizine (ZYRTEC) 10 MG tablet Take 10 mg by mouth daily.        [name] docusate sodium (COLACE) 100 MG capsule Take 100 mg by mouth 2 (two) times daily.        [name] levothyroxine (SYNTHROID, LEVOTHROID) 75 MCG tablet Take 225 mcg by mouth daily.       [name] loperamide (IMODIUM) 2 MG capsule Take 1 mg by mouth 2 (two) times daily. For loose stool       . Lutein 20 MG TABS Take 1 tablet by mouth daily.        . nizatidine (AXID) 150 MG capsule Take 150 mg by mouth at bedtime.        . Omega-3 Fatty Acids (SALMON OIL-1000) 200 MG CAPS Take 2,000 mg by mouth daily.       . phenytoin (DILANTIN) 100 MG ER capsule Take 100 mg by mouth 3 (three) times daily.        . predniSONE (DELTASONE) 5 MG tablet Take 5 mg by mouth daily.       . rosuvastatin  (CRESTOR) 5 MG tablet Take 5 mg by mouth every other day.       . tacrolimus (PROGRAF) 1 MG capsule Take 2 mg by mouth 2 (two) times daily.        . vitamin C (ASCORBIC ACID) 500 MG tablet Take 500 mg by mouth 2 (two) times daily.       [name] amoxicillin-clavulanate (AUGMENTIN) 500-125 MG per tablet Take 1 tablet by mouth 2 (two) times daily.       . B Complex-C-Folic Acid (B COMPLEX-VITAMIN C-FOLIC ACID) 1 MG tablet Take 1 tablet by mouth daily.       . carvedilol (COREG) 3.125 MG tablet Take 1 tablet (3.125 mg total) by mouth 2 (two) times daily with a meal.  60 tablet  1  . guaiFENesin (MUCINEX) 600 MG 12 hr tablet Take 600 mg by mouth 2 (two) times daily as needed.       . midodrine (PROAMATINE) 5 MG tablet Take 5 mg by mouth See admin instructions. Take one tablet before dialysis and if blood pressure is less than 100      . nitroGLYCERIN (NITROSTAT) 0.4 MG SL tablet Place 1 tablet (0.4 mg total) under the tongue every 5 (five) minutes as needed for chest pain.  30 tablet  1     Allergies  Allergen Reactions  . Avelox (Moxifloxacin Hcl In Nacl)     Ask patient to clarify type of medication. Also need to know type/severity/reaction.  . Ceftriaxone Sodium Nausea And Vomiting  . Dilaudid (Hydromorphone Hcl)     sensitivity  . Protamine     Significant hypotension?  . Rocephin (Ceftriaxone Sodium In Dextrose)     Ask patient to clarify type of medication. Also need to  know type/severity/reaction.  . Ciprofloxacin Rash  . Moxifloxacin Rash    BP 142/70  Pulse 76  Temp 97.6 F (36.4 C) (Temporal)  Resp 18  Ht 5' 10.52" (1.791 m)  Wt 135 lb 9.6 oz (61.508 kg)  BMI 19.17 kg/m2  No results found.   Review of Systems  Constitutional: Negative for fever, chills and diaphoresis.  HENT: Negative for nosebleeds, sore throat, facial swelling, mouth sores, trouble swallowing and ear discharge.   Eyes: Positive for visual disturbance. Negative for photophobia and discharge.  Respiratory:  Negative for choking, chest tightness, shortness of breath and stridor.   Cardiovascular: Negative for chest pain and palpitations.  Gastrointestinal: Positive for diarrhea. Negative for nausea, vomiting, abdominal pain, constipation, blood in stool, abdominal distention, anal bleeding and rectal pain.  Genitourinary: Negative for dysuria, urgency, difficulty urinating and testicular pain.  Musculoskeletal: Positive for gait problem. Negative for myalgias, back pain and arthralgias.  Skin: Negative for color change, pallor, rash and wound.  Neurological: Negative for dizziness, speech difficulty, weakness, numbness and headaches.  Hematological: Negative for adenopathy. Does not bruise/bleed easily.  Psychiatric/Behavioral: Negative for hallucinations, confusion and agitation.       Objective:   Physical Exam  Constitutional: He is oriented to person, place, and time. He appears well-developed and well-nourished. No distress.  HENT:  Head: Normocephalic.  Mouth/Throat: Oropharynx is clear and moist. No oropharyngeal exudate.  Eyes: Conjunctivae normal and EOM are normal. Pupils are equal, round, and reactive to light. No scleral icterus.  Neck: Normal range of motion. Neck supple. No tracheal deviation present.  Cardiovascular: Normal rate, regular rhythm and intact distal pulses.   Pulmonary/Chest: Effort normal and breath sounds normal. No respiratory distress.  Abdominal: Soft. Bowel sounds are normal. He exhibits no distension, no fluid wave and no ascites. There is no tenderness. There is no rigidity, no guarding, no CVA tenderness, no tenderness at McBurney's point and negative Murphy's sign. No hernia. Hernia confirmed negative in the right inguinal area and confirmed negative in the left inguinal area.    Musculoskeletal: Normal range of motion. He exhibits no tenderness.       AV shunt in left thigh.  R AKA.  Partially amputated left finger  Lymphadenopathy:    He has no cervical  adenopathy.       Right: No inguinal adenopathy present.       Left: No inguinal adenopathy present.  Neurological: He is alert and oriented to person, place, and time. No cranial nerve deficit. He exhibits normal muscle tone. Coordination normal.  Skin: Skin is warm and dry. No rash noted. He is not diaphoretic. No erythema. No pallor.  Psychiatric: He has a normal mood and affect. His behavior is normal. Judgment and thought content normal.       Assessment:     Intraluminal gallbladder mass suspicious for polyp.  Stable in size for many years makes cancer less likely although not definitely ruled out.  No evidence of metastatic disease.    Plan:     I spent over an hour seeing the patient, reviewing the chart/studies, and trying to sort him out.  Complicated medical history.  I reviewed the films with radiology and my partner, Dr. Almond Lint, whom is a hepatobiliary pancreatic surgeon.  I suspect this is not a cancer & just a large polyp Since there has been a mass in the gallbladder since 2010 and is rather stable over the past three years.  There is no obvious invasion into  the liver on CT scan or ultrasound.  Getting an MRI of the abdomen be helpful.  However, his pacemaker prevents this.  Given the fact he has not intraluminal gallbladder mass and has had some symptoms suspicious for biliary colic, I think he would benefit from cholecystectomy.  Would start out laparoscopically.  Numerous prior abdominal surgeries make single site not an option.  We do classic for port.  I did caution him that there is a possibility he would need a partial wedge resection if there is evidence of invasion into the liver.  I think a formal hepatectomy would be too stressful in this patient.  The patient would like to avoid a major resection but does agree does not make sense to keep the mass in his gallbladder.  The anatomy & physiology of hepatobiliary & pancreatic function was discussed.  The  pathophysiology of gallbladder dysfunction was discussed.  Natural history risks without surgery was discussed.   I feel the risks of no intervention will lead to serious problems that outweigh the operative risks; therefore, I recommended cholecystectomy to remove the pathology.  I explained laparoscopic techniques with possible need for an open approach.  Probable cholangiogram to evaluate the bilary tract was explained as well.Possibility of converting to open was discussed.  Possibility of needing a placed a wedge resection of the liver or more complex mild dissection to safely remove the mass en bloc suspicion of cancer is higher.  We will try to avoid a major hepatic resection as the morbidity of that is much higher.    Risks such as bleeding, infection, abscess, leak, injury to other organs, need for further treatment, heart attack, death, and other risks were discussed.  I noted a good likelihood this will help address the problem.  Possibility that this will not correct all abdominal symptoms was explained.  Goals of post-operative recovery were discussed as well.  We will work to minimize complications.  An educational handout further explaining the pathology and treatment options was given as well.  Questions were answered.  The patient expresses understanding & wishes to proceed with surgery.   We will order a CA 19-9.  That is a very abnormal, we will need to regroup and see if major hepatic resection is needed.  If that is normal, that is more reassuring for a more benign process.  I think he needs cardiac clearance.  His operative risks are obviously much higher than average.  I am concerned about the health of the patient and the ability to tolerate the operation.  Therefore, we will request clearance by cardiology to better assess operative risk & see if a reevaluation, further workup, adjustment to medications, etc is needed.

## 2012-03-11 NOTE — Patient Instructions (Signed)
See the Handout(s) we gave you.  Consider surgery.  Please call our office at [phone] if you wish to schedule surgery or if you have further questions / concerns.   You have a mass inside her gallbladder.  Probable polyp vs. Early cancer.  You need at least to have your gallbladder removed.  Possibly part of your liver removed as well.  We will start out laparoscopically.  Possible conversion to open larger resection.  To be safe, would like to get clearance from your cardiologist.

## 2012-03-12 ENCOUNTER — Telehealth (INDEPENDENT_AMBULATORY_CARE_PROVIDER_SITE_OTHER): Payer: Self-pay | Admitting: General Surgery

## 2012-03-12 NOTE — Telephone Encounter (Signed)
Pt called re message left on machine/ I reviewed message with him regarding tumor marker and proceeding with surgery. He seemed to understand message and I said I would notify Elease Hashimoto that I spoke with him in case she had any further instructions/gy

## 2012-03-12 NOTE — Telephone Encounter (Signed)
Message copied by Liliana Cline on Thu Mar 12, 2012 11:13 AM ------      Message from: Ardeth Sportsman      Created: Thu Mar 12, 2012  7:30 AM       Tell pt the good news!  Cancer marker WNL = less likely to be a gallbladder cancer.  Continue plan of lap chole with hope of not needing liver resection as well

## 2012-03-12 NOTE — Telephone Encounter (Signed)
Left message on machine for patient to call back and ask for me or Alisha. To make him aware that the cancer marker is low showing that this is less likely a gallbladder cancer but we will proceed with surgery. Awaiting call back.

## 2012-03-17 ENCOUNTER — Telehealth (INDEPENDENT_AMBULATORY_CARE_PROVIDER_SITE_OTHER): Payer: Self-pay

## 2012-03-17 ENCOUNTER — Telehealth: Payer: Self-pay | Admitting: Internal Medicine

## 2012-03-17 NOTE — Telephone Encounter (Signed)
New Problem:    Called in wanting to know the status of a surgical clearance that they sent through [facility] on 03/11/12.  Please call back.

## 2012-03-17 NOTE — Telephone Encounter (Signed)
LM for Dr Lubertha Basque nurse to call me so I can check on the status of cardiac clearance.

## 2012-03-17 NOTE — Telephone Encounter (Signed)
Spoke with Britta Mccreedy at dr Gordy Savers office, aware dr Ladona Ridgel still needs to review. Procedure is not scheduled yet.

## 2012-03-20 ENCOUNTER — Emergency Department (HOSPITAL_COMMUNITY): Payer: Medicare Other

## 2012-03-20 ENCOUNTER — Telehealth (INDEPENDENT_AMBULATORY_CARE_PROVIDER_SITE_OTHER): Payer: Self-pay

## 2012-03-20 ENCOUNTER — Encounter (HOSPITAL_COMMUNITY): Payer: Self-pay | Admitting: Family Medicine

## 2012-03-20 ENCOUNTER — Emergency Department (HOSPITAL_COMMUNITY)
Admission: EM | Admit: 2012-03-20 | Discharge: 2012-03-20 | Disposition: A | Discharge status: Home / Self Care | Payer: Medicare Other | Attending: Emergency Medicine | Admitting: Emergency Medicine

## 2012-03-20 DIAGNOSIS — E785 Hyperlipidemia, unspecified: Secondary | ICD-10-CM | POA: Insufficient documentation

## 2012-03-20 DIAGNOSIS — E1149 Type 2 diabetes mellitus with other diabetic neurological complication: Secondary | ICD-10-CM | POA: Insufficient documentation

## 2012-03-20 DIAGNOSIS — Z8614 Personal history of Methicillin resistant Staphylococcus aureus infection: Secondary | ICD-10-CM | POA: Insufficient documentation

## 2012-03-20 DIAGNOSIS — H409 Unspecified glaucoma: Secondary | ICD-10-CM | POA: Insufficient documentation

## 2012-03-20 DIAGNOSIS — IMO0002 Reserved for concepts with insufficient information to code with codable children: Secondary | ICD-10-CM | POA: Insufficient documentation

## 2012-03-20 DIAGNOSIS — K8020 Calculus of gallbladder without cholecystitis without obstruction: Secondary | ICD-10-CM | POA: Insufficient documentation

## 2012-03-20 DIAGNOSIS — Z95810 Presence of automatic (implantable) cardiac defibrillator: Secondary | ICD-10-CM | POA: Insufficient documentation

## 2012-03-20 DIAGNOSIS — E038 Other specified hypothyroidism: Secondary | ICD-10-CM | POA: Insufficient documentation

## 2012-03-20 DIAGNOSIS — K8050 Calculus of bile duct without cholangitis or cholecystitis without obstruction: Secondary | ICD-10-CM

## 2012-03-20 DIAGNOSIS — Z87448 Personal history of other diseases of urinary system: Secondary | ICD-10-CM | POA: Insufficient documentation

## 2012-03-20 DIAGNOSIS — G909 Disorder of the autonomic nervous system, unspecified: Secondary | ICD-10-CM | POA: Insufficient documentation

## 2012-03-20 DIAGNOSIS — K219 Gastro-esophageal reflux disease without esophagitis: Secondary | ICD-10-CM | POA: Insufficient documentation

## 2012-03-20 DIAGNOSIS — S88119A Complete traumatic amputation at level between knee and ankle, unspecified lower leg, initial encounter: Secondary | ICD-10-CM | POA: Insufficient documentation

## 2012-03-20 DIAGNOSIS — Z85828 Personal history of other malignant neoplasm of skin: Secondary | ICD-10-CM | POA: Insufficient documentation

## 2012-03-20 DIAGNOSIS — I120 Hypertensive chronic kidney disease with stage 5 chronic kidney disease or end stage renal disease: Secondary | ICD-10-CM | POA: Insufficient documentation

## 2012-03-20 DIAGNOSIS — Z862 Personal history of diseases of the blood and blood-forming organs and certain disorders involving the immune mechanism: Secondary | ICD-10-CM | POA: Insufficient documentation

## 2012-03-20 DIAGNOSIS — K279 Peptic ulcer, site unspecified, unspecified as acute or chronic, without hemorrhage or perforation: Secondary | ICD-10-CM | POA: Insufficient documentation

## 2012-03-20 DIAGNOSIS — Z992 Dependence on renal dialysis: Secondary | ICD-10-CM | POA: Insufficient documentation

## 2012-03-20 DIAGNOSIS — Z8679 Personal history of other diseases of the circulatory system: Secondary | ICD-10-CM | POA: Insufficient documentation

## 2012-03-20 DIAGNOSIS — N186 End stage renal disease: Secondary | ICD-10-CM | POA: Insufficient documentation

## 2012-03-20 DIAGNOSIS — Z8719 Personal history of other diseases of the digestive system: Secondary | ICD-10-CM | POA: Insufficient documentation

## 2012-03-20 DIAGNOSIS — Z79899 Other long term (current) drug therapy: Secondary | ICD-10-CM | POA: Insufficient documentation

## 2012-03-20 DIAGNOSIS — R011 Cardiac murmur, unspecified: Secondary | ICD-10-CM | POA: Insufficient documentation

## 2012-03-20 LAB — CBC WITH DIFFERENTIAL/PLATELET
Basophils Absolute: 0 10*3/uL (ref 0.0–0.1)
Basophils Relative: 0 % (ref 0–1)
HCT: 39.2 % (ref 39.0–52.0)
Hemoglobin: 13.3 g/dL (ref 13.0–17.0)
Lymphocytes Relative: 12 % (ref 12–46)
MCHC: 33.9 g/dL (ref 30.0–36.0)
Monocytes Absolute: 0.5 10*3/uL (ref 0.1–1.0)
Neutro Abs: 4.7 10*3/uL (ref 1.7–7.7)
Neutrophils Relative %: 74 % (ref 43–77)
RDW: 15.4 % (ref 11.5–15.5)
WBC: 6.3 10*3/uL (ref 4.0–10.5)

## 2012-03-20 LAB — COMPREHENSIVE METABOLIC PANEL
ALT: 16 U/L (ref 0–53)
AST: 33 U/L (ref 0–37)
Albumin: 3.2 g/dL — ABNORMAL LOW (ref 3.5–5.2)
Alkaline Phosphatase: 146 U/L — ABNORMAL HIGH (ref 39–117)
CO2: 32 mEq/L (ref 19–32)
Chloride: 99 mEq/L (ref 96–112)
Creatinine, Ser: 4.28 mg/dL — ABNORMAL HIGH (ref 0.50–1.35)
GFR calc non Af Amer: 14 mL/min — ABNORMAL LOW (ref >90)
Potassium: 3.8 mEq/L (ref 3.5–5.1)
Total Bilirubin: 0.3 mg/dL (ref 0.3–1.2)

## 2012-03-20 NOTE — ED Provider Notes (Signed)
Medical screening examination/treatment/procedure(s) were conducted as a shared visit with non-physician practitioner(s) and myself.  I personally evaluated the patient during the encounter  Patient's symptoms seem consistent with biliary colic.  In 2009 he had cholelithiasis.  I suspect he has ongoing cholelithiasis and that this is biliary colic.  I agree with general surgery followup and plans for cholecystectomy once medical clearance given.  Lyanne Co, MD 03/20/12 [phone]

## 2012-03-20 NOTE — ED Provider Notes (Signed)
History     CSN: [id number]  Arrival date & time 03/20/12  1111   First MD Initiated Contact with Patient 03/20/12 1119      Chief Complaint  Patient presents with  . Abdominal Pain    (Consider location/radiation/quality/duration/timing/severity/associated sxs/prior treatment) HPI  52 year old male with history of end-stage renal failure currently on Monday Wednesday Friday dialysis, history of diabetes, and history of cholelithiasis presents complaining of abdominal pain. Patient reports he was in the middle of his dialysis treatment today when he developed pain to his right upper quadrant abdomen. Pain is sharp, acute on onset, lasting for 1 hr, non radiating, 8/10 with associated nausea.  Pain has since subsided without specific treatment.  Currently denies fever, but does endorse chills, non productive cough, runny nose, nasal congestion and sore throat.  Sts he has had flu-like sxs for nearly a week and is getting better.  Pt mentioned several years ago he developed nausea and no appetite and had a CT and subsequent US done which shows "polyps" in gallbladder without mentioned of gallstone.  Pt did not have any pain at that time. Pt reports he was scheduled to have his gallbladder removed by [facility] sometimes in the future "just to be safe".  Sts he has to be cleared by his cardiologist before having this procedure.  However, developed pain today, prompting him to come to ER.    Past Medical History  Diagnosis Date  . Secondary hyperparathyroidism, renal     In setting of ESRD.  [name] Hypothyroidism   . Automatic implantable cardiac defibrillator in situ     CRT-ICD   . Hypertension   . Nonischemic cardiomyopathy     2D-echocardiogram (08/2007) - LV EF 45%, akinesis of inferoseptal wall, inferior wall,   . ESRD on hemodialysis Started age 21yo    on MWF schedule. Previous failed renal transplant 1998 at [facility] (with rejection in 1998). 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 Resumed HD 10/2006.   [name] DM type 1 (diabetes mellitus, type 1) DX: age 23yo    previously well controlled with Aic 5 (12/2008)  . Intracranial hemorrhage     History of. On prophylactic dilantin.  . 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  . Hyperlipidemia   . Hemopericardium     History of in setting of failed radiogrequency ablation for Vtach  . Anemia of chronic disease     BL Hgb 11-13. In setting of ESRD.   [name] Peptic ulcer disease     Unknown history, no records per [employer].   . Cholelithiasis     Noted at least since 01/2006. Asymptomatic.  . Erectile dysfunction   . Peripheral autonomic neuropathy due to diabetes mellitus   . GERD (gastroesophageal reflux disease)   . Glaucoma(365)   . Squamous cell skin cancer, finger     History of. Marginal resection in 05/2008 with recurrent infection/ lytic lesion involving distal phalanx  . Toe amputation status     Ischemic fourth and fifith toe left (03/2009), history of previous 1,2,3 toe  amputations.   [name] MRSA (methicillin resistant staph aureus) culture positive     Verify type - Per medical history form dated 02/13/10.  [name] Poor circulation     per medical history form dated 02/13/10.  . Ventricular tachycardia     appropriate ATP and Shocks 12/12  . RIATA LEAD  EXTERNALIZATION 12/12 (sk) // high RV threshold  . S/P BKA (below knee amputation)     right  . Arthritis   . Blood transfusion without reported diagnosis   . Heart murmur     Past Surgical History  Procedure Date  . Cardiac defibrillator placement 11/05    St Jude  . Transplant pancreatic allograft 2002  . Toe amputation 03/2009    left 4th, 5th toes. Previous 1,2, 3 toe left.  . Kidney transplant 2002  . Above knee leg amputation 11/09/10    Right AKA    Family History  Problem Relation Age of Onset  . Hypertension Brother   .  Hypertension Sister   . Coronary artery disease Mother     x 5 vessel graft in her 4s  . Hypertension Mother   . Prostate cancer Father   . Hypertension Father   . Cancer Father     Prostate  . Hypertension Brother     History  Substance Use Topics  . Smoking status: Never Smoker   . Smokeless tobacco: Never Used  . Alcohol Use: No     Comment: previously drank heavily from 1980-1987      Review of Systems  Constitutional:       A complete 10 system review of systems was obtained and all systems are negative except as noted in the HPI and PMH.    Allergies  Avelox; Ceftriaxone sodium; Dilaudid; Protamine; Rocephin; Ciprofloxacin; and Moxifloxacin  Home Medications   Current Outpatient Rx  Name  Route  Sig  Dispense  Refill  . ACETAMINOPHEN 325 MG PO TABS   Oral   Take 650 mg by mouth every 6 (six) hours as needed. For pain/fever          . ALBUTEROL SULFATE HFA 108 (90 BASE) MCG/ACT IN AERS   Inhalation   Inhale 2 puffs into the lungs every 4 (four) hours as needed. For shortness of breath          . AMIODARONE HCL 200 MG PO TABS   Oral   Take 1 tablet (200 mg total) by mouth daily.   30 tablet   6   . AMOXICILLIN-POT CLAVULANATE 500-125 MG PO TABS   Oral   Take 1 tablet by mouth 2 (two) times daily.          . AZATHIOPRINE 50 MG PO TABS   Oral   Take 50 mg by mouth daily. 1 1/2 tablet         . NEPHRO-VITE RX 1 MG PO TABS   Oral   Take 1 tablet by mouth daily.          [name] BIMATOPROST 0.03 % OP SOLN   Ophthalmic   Apply 1 drop to eye at bedtime.           [name] EQUALACTIN PO   Oral   Take 2 tablets by mouth 2 (two) times daily.          [name] CARVEDILOL 3.125 MG PO TABS   Oral   Take 1 tablet (3.125 mg total) by mouth 2 (two) times daily with a meal.   60 tablet   1   . CETIRIZINE HCL 10 MG PO TABS   Oral   Take 10 mg by mouth daily.           [name] DOCUSATE SODIUM 100 MG PO CAPS   Oral   Take 100 mg by mouth 2 (two) times daily.            [name]  GUAIFENESIN ER 600 MG PO TB12   Oral   Take 600 mg by mouth 2 (two) times daily as needed.          [name] LEVOTHYROXINE SODIUM 75 MCG PO TABS   Oral   Take 225 mcg by mouth daily.          [name] LOPERAMIDE HCL 2 MG PO CAPS   Oral   Take 1 mg by mouth 2 (two) times daily. For loose stool          . LUTEIN 20 MG PO TABS   Oral   Take 1 tablet by mouth daily.           [name] MIDODRINE HCL 5 MG PO TABS   Oral   Take 5 mg by mouth See admin instructions. Take one tablet before dialysis and if blood pressure is less than 100         . NITROGLYCERIN 0.4 MG SL SUBL   Sublingual   Place 1 tablet (0.4 mg total) under the tongue every 5 (five) minutes as needed for chest pain.   30 tablet   1   . NIZATIDINE 150 MG PO CAPS   Oral   Take 150 mg by mouth at bedtime.           [name] SALMON OIL-1000 200 MG PO CAPS   Oral   Take 2,000 mg by mouth daily.          [name] PHENYTOIN SODIUM EXTENDED 100 MG PO CAPS   Oral   Take 100 mg by mouth 3 (three) times daily.           [name] PREDNISONE 5 MG PO TABS   Oral   Take 5 mg by mouth daily.          [name] ROSUVASTATIN CALCIUM 5 MG PO TABS   Oral   Take 5 mg by mouth every other day.          [name] TACROLIMUS 1 MG PO CAPS   Oral   Take 2 mg by mouth 2 (two) times daily.           [name] VITAMIN C 500 MG PO TABS   Oral   Take 500 mg by mouth 2 (two) times daily.            BP 120/60  Pulse 95  Temp 98.8 F (37.1 C) (Oral)  Resp 25  SpO2 95%  Physical Exam  Nursing note and vitals reviewed. Constitutional: He is oriented to person, place, and time. He appears well-developed and well-nourished. No distress.  HENT:  Head: Normocephalic and atraumatic.  Right Ear: External ear normal.  Left Ear: External ear normal.  Nose: Nose normal.       Lips are dry. Throat with mild post oropharyngeal erythema.  No evidence of deep tissue infection.   Eyes: Conjunctivae normal and EOM are normal. Pupils are equal, round, and reactive to  light. No scleral icterus.  Neck: Normal range of motion. Neck supple.  Cardiovascular: Normal rate and regular rhythm.   Murmur heard. Pulmonary/Chest: Effort normal.  Abdominal: Soft. Bowel sounds are normal. He exhibits no mass. There is no tenderness. There is no rebound and no guarding.       Well healing midline abdominal surgical scar.  Negative Murphy sign, or McBurney's point.  No hernia noted  Musculoskeletal:       R AKA  Lymphadenopathy:    He has no cervical adenopathy.  Neurological: He  is alert and oriented to person, place, and time.  Skin: Skin is warm. No rash noted.  Psychiatric: He has a normal mood and affect.    ED Course  Procedures (including critical care time)  Results for orders placed during the hospital encounter of 03/20/12  CBC WITH DIFFERENTIAL      Component Value Range   WBC 6.3  4.0 - 10.5 K/uL   RBC 3.90 (*) 4.22 - 5.81 MIL/uL   Hemoglobin 13.3  13.0 - 17.0 g/dL   HCT 16.1  09.6 - 04.5 %   MCV 100.5 (*) 78.0 - 100.0 fL   MCH 34.1 (*) 26.0 - 34.0 pg   MCHC 33.9  30.0 - 36.0 g/dL   RDW 40.9  81.1 - 91.4 %   Platelets 132 (*) 150 - 400 K/uL   Neutrophils Relative 74  43 - 77 %   Neutro Abs 4.7  1.7 - 7.7 K/uL   Lymphocytes Relative 12  12 - 46 %   Lymphs Abs 0.8  0.7 - 4.0 K/uL   Monocytes Relative 7  3 - 12 %   Monocytes Absolute 0.5  0.1 - 1.0 K/uL   Eosinophils Relative 6 (*) 0 - 5 %   Eosinophils Absolute 0.4  0.0 - 0.7 K/uL   Basophils Relative 0  0 - 1 %   Basophils Absolute 0.0  0.0 - 0.1 K/uL  COMPREHENSIVE METABOLIC PANEL      Component Value Range   Sodium 142  135 - 145 mEq/L   Potassium 3.8  3.5 - 5.1 mEq/L   Chloride 99  96 - 112 mEq/L   CO2 32  19 - 32 mEq/L   Glucose, Bld 79  70 - 99 mg/dL   BUN 22  6 - 23 mg/dL   Creatinine, Ser 7.82 (*) 0.50 - 1.35 mg/dL   Calcium 9.1  8.4 - 95.6 mg/dL   Total Protein 7.1  6.0 - 8.3 g/dL   Albumin 3.2 (*) 3.5 - 5.2 g/dL   AST 33  0 - 37 U/L   ALT 16  0 - 53 U/L   Alkaline  Phosphatase 146 (*) 39 - 117 U/L   Total Bilirubin 0.3  0.3 - 1.2 mg/dL   GFR calc non Af Amer 14 (*) >90 mL/min   GFR calc Af Amer 17 (*) >90 mL/min  LIPASE, BLOOD      Component Value Range   Lipase 36  11 - 59 U/L   Dg Chest 2 View  03/20/2012  *RADIOLOGY REPORT*  Clinical Data: Cough, abdominal pain  CHEST - 2 VIEW  Comparison: 10/21/2011  Findings: Right subclavian transvenous AICD leads project over right atrium, right ventricle and coronary sinus. A single wire loops over the anterior upper left chest. Borderline enlargement of cardiac silhouette post median sternotomy. Mediastinal contours and pulmonary vascularity normal. Emphysematous and bronchitic changes without pulmonary infiltrate, pleural effusion or pneumothorax. Bones demineralized with note of multiple old left rib fractures.  IMPRESSION: Borderline enlargement of cardiac silhouette. COPD. No acute abnormalities.   Original Report Authenticated By: Ulyses Southward, M.D.      1. Abdominal pain, biliary colic  MDM  Pt presents with RUQ abd pain today.  Is scheduled to have gallbadder removed per pt, but waiting for clearance from heart specialist.  Denies cp/sob but does endorse URI sxs.  Abdomen nontender on palpation.  Will check belly labs, obtain CXR and will continue to monitor.    12:49 PM CXR shows  no evidence of pna.  Has normal liver enzyme and normal lipase.  Is currently pain free.  Sxs likely biliary colic.  Pt will benefit from further management by his PCP and also to f/u with [facility] and cardiologist as previously scheduled.  Care discussed with attending.     BP 120/60  Pulse 95  Temp 98.8 F (37.1 C) (Oral)  Resp 25  SpO2 95%  I have reviewed nursing notes and vital signs. I personally reviewed the imaging tests through PACS system  I reviewed available ER/hospitalization records thought the EMR    Fayrene Helper, PA-C 03/20/12 1256

## 2012-03-20 NOTE — ED Notes (Signed)
Per EMS, pt was in the middle of his dialysis treatment and started having RUQ pain. sts hx of gall bladder issues and waiting on clearance  to have surgery.pt also has a dry cough for a few days.

## 2012-03-20 NOTE — ED Notes (Signed)
Return from xray

## 2012-03-20 NOTE — Telephone Encounter (Signed)
Called pt to notify him of the appt for 03/24/12 arrive at 10:30. The pt understands.

## 2012-03-20 NOTE — ED Notes (Signed)
Patient transported to X-ray 

## 2012-03-20 NOTE — Telephone Encounter (Signed)
Called to check on the status of cardiac clearance with Dr Ladona Ridgel. They told me the clearance is still on the desktop of Dr Ladona Ridgel. I advised the office that the pt is needing to be cleared ASAP b/c he was in the ER today with gallbladder problems. The pt can't get scheduled till he is cleared. Dr Ladona Ridgel will be out of the office all next week so they scheduled the pt to see a P.A. Annice Needy on 1/21 arrive at 10:30. I will notify the pt.

## 2012-03-20 NOTE — Telephone Encounter (Signed)
Pt calling in to check on his clearance for surgery. I notified the pt that I did call Dr Lubertha Basque office on Tuesday following up with the clearance but have not heard back from their office. I advised pt that I would call Dr Lubertha Basque office.

## 2012-03-23 ENCOUNTER — Ambulatory Visit: Payer: Medicare Other | Admitting: Nurse Practitioner

## 2012-03-24 ENCOUNTER — Ambulatory Visit (INDEPENDENT_AMBULATORY_CARE_PROVIDER_SITE_OTHER): Payer: Medicare Other | Admitting: Nurse Practitioner

## 2012-03-24 ENCOUNTER — Encounter: Payer: Self-pay | Admitting: Nurse Practitioner

## 2012-03-24 VITALS — BP 110/60 | HR 72 | Ht 70.0 in | Wt 135.6 lb

## 2012-03-24 DIAGNOSIS — Z01818 Encounter for other preprocedural examination: Secondary | ICD-10-CM

## 2012-03-24 NOTE — Progress Notes (Addendum)
[name] Date of Birth: 09-14-1960 Medical Record #[id number]  History of Present Illness: [name] is seen today for a pre op clearance visit. He is seen for Dr. Ladona Ridgel. Last seen in this office last August. Felt to be stable. Has multiple medical issues which include ESRD on hemodialysis with failed renal transplant in 1998, working pancrease transplant, nonischemic CM, VT on chronic amiodarone with failed ablation and subsequent hemopericardium, BiVICD in place, DM, remote intracranial hemorrhage, multiple digit amputations, HLD, ED, AS, neuropathy, GERD, and right AKdA.   He comes in today. He is here with his wife. He now has a gallbladder mass, concerning for cancer/polyp. Seeing Dr. Michaell Cowing. Requesting surgery ASAP.   From a cardiac standpoint, he seems to be doing ok. No active chest pain. Not short of breath. Pretty sedentary and spends the day watching TV. Does go to rehab for his AKA two times a week and able to walk just a couple of hundred feet with his prosthesis. No ICD shocks. No passing out. Has been back to the ER with gallbladder attack this past Friday. Last echo looks to be in 2009. EF is 45% with mild AS. Rarely having to use Midodrine during dialysis.   Current Outpatient Prescriptions on File Prior to Visit  Medication Sig Dispense Refill  . acetaminophen (TYLENOL) 325 MG tablet Take 650 mg by mouth every 6 (six) hours as needed. For pain/fever       . albuterol (PROVENTIL HFA;VENTOLIN HFA) 108 (90 BASE) MCG/ACT inhaler Inhale 2 puffs into the lungs every 4 (four) hours as needed. For shortness of breath       . amiodarone (PACERONE) 200 MG tablet Take 1 tablet (200 mg total) by mouth daily.  30 tablet  6  . azaTHIOprine (IMURAN) 50 MG tablet Take 50 mg by mouth daily. 1 1/2 tablet      . B Complex-C-Folic Acid (B COMPLEX-VITAMIN C-FOLIC ACID) 1 MG tablet Take 1 tablet by mouth daily.       . bimatoprost (LUMIGAN) 0.03 % ophthalmic solution Apply 1 drop to eye at  bedtime.        . Calcium Polycarbophil (EQUALACTIN PO) Take 2 tablets by mouth 2 (two) times daily.       . carvedilol (COREG) 3.125 MG tablet Take 3.125 mg by mouth daily. On dialysis days and saturday      . cetirizine (ZYRTEC) 10 MG tablet Take 10 mg by mouth daily.        [name] docusate sodium (COLACE) 100 MG capsule Take 100 mg by mouth as needed.       [name] guaiFENesin (MUCINEX) 600 MG 12 hr tablet Take 600 mg by mouth 2 (two) times daily as needed.       [name] levothyroxine (SYNTHROID, LEVOTHROID) 75 MCG tablet Take 225 mcg by mouth daily.       [name] loperamide (IMODIUM) 2 MG capsule Take 1 mg by mouth 2 (two) times daily. For loose stool      . Lutein 20 MG TABS Take 1 tablet by mouth daily.        . midodrine (PROAMATINE) 5 MG tablet Take 5 mg by mouth See admin instructions. Take one tablet before dialysis and if blood pressure is less than 100      . nitroGLYCERIN (NITROSTAT) 0.4 MG SL tablet Place 1 tablet (0.4 mg total) under the tongue every 5 (five) minutes as needed for chest pain.  30 tablet  1  . nizatidine (AXID)  150 MG capsule Take 150 mg by mouth at bedtime.        . Omega-3 Fatty Acids (SALMON OIL-1000) 200 MG CAPS Take 2,000 mg by mouth daily.       . phenytoin (DILANTIN) 100 MG ER capsule Take 100 mg by mouth 3 (three) times daily.        . predniSONE (DELTASONE) 5 MG tablet Take 5 mg by mouth daily.       . rosuvastatin (CRESTOR) 5 MG tablet Take 5 mg by mouth every other day.       . tacrolimus (PROGRAF) 1 MG capsule Take 2 mg by mouth 2 (two) times daily.        . vitamin C (ASCORBIC ACID) 500 MG tablet Take 500 mg by mouth 2 (two) times daily.         Allergies  Allergen Reactions  . Avelox (Moxifloxacin Hcl In Nacl)     Ask patient to clarify type of medication. Also need to know type/severity/reaction.  . Ceftriaxone Sodium Nausea And Vomiting  . Dilaudid (Hydromorphone Hcl)     sensitivity  . Protamine     Significant hypotension?  . Rocephin (Ceftriaxone Sodium In  Dextrose)     Ask patient to clarify type of medication. Also need to know type/severity/reaction.  . Ciprofloxacin Rash  . Moxifloxacin Rash    Past Medical History  Diagnosis Date  . Secondary hyperparathyroidism, renal     In setting of ESRD.  [name] Hypothyroidism   . Automatic implantable cardiac defibrillator in situ     CRT-ICD   . Hypertension   . Nonischemic cardiomyopathy     2D-echocardiogram (08/2007) - LV EF 45%, akinesis of inferoseptal wall, inferior wall,   . ESRD on hemodialysis Started age 73yo    on MWF schedule. Previous failed renal transplant 1998 at [facility] (with rejection in 1998). 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 Resumed HD 10/2006.   [name] DM type 1 (diabetes mellitus, type 1) DX: age 51yo    previously well controlled with Aic 5 (12/2008)  . Intracranial hemorrhage     History of. On prophylactic dilantin.  . 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  . Hyperlipidemia   . Hemopericardium     History of in setting of failed radiogrequency ablation for Vtach  . Anemia of chronic disease     BL Hgb 11-13. In setting of ESRD.   [name] Peptic ulcer disease     Unknown history, no records per [employer].   . Cholelithiasis     Noted at least since 01/2006. Asymptomatic.  . Erectile dysfunction   . Peripheral autonomic neuropathy due to diabetes mellitus   . GERD (gastroesophageal reflux disease)   . Glaucoma(365)   . Squamous cell skin cancer, finger     History of. Marginal resection in 05/2008 with recurrent infection/ lytic lesion involving distal phalanx  . Toe amputation status     Ischemic fourth and fifith toe left (03/2009), history of previous 1,2,3 toe  amputations.   [name] MRSA (methicillin resistant staph aureus) culture positive     Verify type - Per medical history form dated 02/13/10.  [name] Poor circulation     per medical  history form dated 02/13/10.  . Ventricular tachycardia     appropriate ATP and Shocks 12/12  . RIATA LEAD     EXTERNALIZATION 12/12 (sk) // high  RV threshold  . S/P BKA (below knee amputation)     right  . Arthritis   . Blood transfusion without reported diagnosis   . Heart murmur     Past Surgical History  Procedure Date  . Cardiac defibrillator placement 11/05    St Jude  . Transplant pancreatic allograft 2002  . Toe amputation 03/2009    left 4th, 5th toes. Previous 1,2, 3 toe left.  . Kidney transplant 2002  . Above knee leg amputation 11/09/10    Right AKA    History  Smoking status  . Never Smoker   Smokeless tobacco  . Never Used    History  Alcohol Use No    Comment: previously drank heavily from [phone]    Family History  Problem Relation Age of Onset  . Hypertension Brother   . Hypertension Sister   . Coronary artery disease Mother     x 5 vessel graft in her 10s  . Hypertension Mother   . Prostate cancer Father   . Hypertension Father   . Cancer Father     Prostate  . Hypertension Brother     Review of Systems: The review of systems is per the HPI.  All other systems were reviewed and are negative.  Physical Exam: BP 110/60  Pulse 72  Ht 5\' 10"  (1.778 m)  Wt 135 lb 9.3 oz (61.5 kg)  BMI 19.45 kg/m2 Patient is pleasant and in no acute distress. Skin is warm and dry. Color is normal.  HEENT is unremarkable. Normocephalic/atraumatic. PERRL. Sclera are nonicteric. Neck is supple. No masses. No JVD. Lungs are clear. Cardiac exam shows a regular rate and rhythm. Soft outflow murmur noted.  Abdomen is soft. Extremities are without edema. Right AKA noted. Gait is not tested. ROM appears intact. Multiple digits missing. R BKA noted. No gross neurologic deficits noted.  LABORATORY DATA: EKG today is paced.   Lab Results  Component Value Date   WBC 6.3 03/20/2012   HGB 13.3 03/20/2012   HCT 39.2 03/20/2012   PLT 132* 03/20/2012   GLUCOSE 79  03/20/2012   CHOL  Value: 125        ATP III CLASSIFICATION:  <200     mg/dL   Desirable  474-259  mg/dL   Borderline High  >=563    mg/dL   High        87/07/6431   TRIG 92 12/04/2008   HDL 59 12/04/2008   LDLCALC  Value: 48        Total Cholesterol/HDL:CHD Risk Coronary Heart Disease Risk Table                     Men   Women  1/2 Average Risk   3.4   3.3  Average Risk       5.0   4.4  2 X Average Risk   9.6   7.1  3 X Average Risk  23.4   11.0        Use the calculated Patient Ratio above and the CHD Risk Table to determine the patient's CHD Risk.        ATP III CLASSIFICATION (LDL):  <100     mg/dL   Optimal  295-188  mg/dL   Near or Above                    Optimal  130-159  mg/dL   Borderline  416-606  mg/dL   High  >  190     mg/dL   Very High 01/0/2725   ALT 16 03/20/2012   AST 33 03/20/2012   NA 142 03/20/2012   K 3.8 03/20/2012   CL 99 03/20/2012   CREATININE 4.28* 03/20/2012   BUN 22 03/20/2012   CO2 32 03/20/2012   TSH 1.020 03/03/2011   INR 1.19 03/04/2011   HGBA1C 4.9 11/10/2010   Echo Summary from 2009  - Overall left ventricular systolic function was mildly decreased. Left ventricular ejection fraction was estimated to be 45 %. There was akinesis of the inferoseptal wall. There was akinesis of the inferior wall. Doppler parameters were consistent with restrictive physiology, indicative of decreased left ventricular diastolic compliance and/or increased left atrial pressure. Doppler parameters were consistent with high left ventricular filling pressure. - Aortic valve thickness was moderately increased. Findings were consistent with mild aortic valve stenosis. The mean transaortic valve gradient was 13 mmHg. Estimated aortic valve area (by VTI) was 1.41 cm^2. Estimated aortic valve area (by Vmax) was 1.22 cm^2. - There was moderate mitral annular calcification. - The left atrium was moderately dilated. The interatrial septum bows from left to right, consistent with increased  left atrial pressure. - There was the appearance of a catheter or pacing wire in the right ventricle.    Assessment / Plan: 1. Concern for gallbladder mass/cancer/polyp - with need for preoperative clearance.   2. Nonischemic CM with BiV/ICD in place. Last EF was 45% back in 2009.   3. VT with failed ablation and maintained on chronic amiodarone therapy. Has his ICD in place. No shocks reported.   4. ESRD - on dialysis with failed renal transplant  5. DM - with working pancrease transplant  In light of the patient's numerous problems/issues, he is felt to be at moderate to high risk for any type of surgery. However, given the concern for possible gallbladder cancer/mass as well as his recurrent symptoms of gallbladder disease, surgical intervention will need to be undertaken. He seems to be fairly stable at this time from our standpoint. Would continue with the patient's current medicines. Will need his ICD turned off during the surgery (has a ST Jude device). We can certainly follow him in the post op phase if needed. I do not think further cardiac testing at this time is warranted. I have discussed with Dr. Excell Seltzer (DOD) in Dr. Lubertha Basque absence who is in agreement with this plan. His risk is felt to be moderate to high with nothing that we can do to change that degree of risk.   Patient is agreeable to this plan and will call if any problems develop in the interim.    I did try to arrange for an updated echo today in the office. The patient did not wish to wait and proceed with this study today.

## 2012-03-24 NOTE — Patient Instructions (Addendum)
You may schedule your surgery with Dr. Michaell Cowing. I will send him my note.  Stay on your current medicines.   Call the [facility] office at [phone] if you have any questions, problems or concerns.

## 2012-03-31 ENCOUNTER — Telehealth (INDEPENDENT_AMBULATORY_CARE_PROVIDER_SITE_OTHER): Payer: Self-pay

## 2012-03-31 NOTE — Telephone Encounter (Signed)
Emailed Dawn the [employer] for her to do a pre op marking on the pt before his surgery 04/16/12 at [facility].

## 2012-04-09 ENCOUNTER — Encounter (INDEPENDENT_AMBULATORY_CARE_PROVIDER_SITE_OTHER): Payer: Self-pay

## 2012-04-10 ENCOUNTER — Encounter (INDEPENDENT_AMBULATORY_CARE_PROVIDER_SITE_OTHER): Payer: Self-pay

## 2012-04-10 ENCOUNTER — Telehealth (INDEPENDENT_AMBULATORY_CARE_PROVIDER_SITE_OTHER): Payer: Self-pay | Admitting: General Surgery

## 2012-04-10 NOTE — Telephone Encounter (Signed)
Patient called asking if he will have to stay in the hospital after the surgery. He wanted to know if he will have to stay overnight or longer. Advised the patient the stay will not be known until the procedure has been completed and how he recovers. Advised him to make sure there is someone to drive him home. Patient agreed.

## 2012-04-14 ENCOUNTER — Encounter (HOSPITAL_COMMUNITY)
Admission: RE | Admit: 2012-04-14 | Discharge: 2012-04-14 | Disposition: A | Discharge status: Home / Self Care | Payer: Medicare Other | Source: Ambulatory Visit | Attending: Surgery | Admitting: Surgery

## 2012-04-14 ENCOUNTER — Encounter (HOSPITAL_COMMUNITY): Payer: Self-pay

## 2012-04-14 ENCOUNTER — Encounter (HOSPITAL_COMMUNITY): Payer: Self-pay | Admitting: Pharmacy Technician

## 2012-04-14 HISTORY — DX: Presence of automatic (implantable) cardiac defibrillator: Z95.810

## 2012-04-14 HISTORY — DX: Presence of other vascular implants and grafts: Z95.828

## 2012-04-14 HISTORY — DX: Legal blindness, as defined in USA: H54.8

## 2012-04-14 LAB — CBC
HCT: 38.9 % — ABNORMAL LOW (ref 39.0–52.0)
MCH: 34.1 pg — ABNORMAL HIGH (ref 26.0–34.0)
MCHC: 33.4 g/dL (ref 30.0–36.0)
MCV: 102.1 fL — ABNORMAL HIGH (ref 78.0–100.0)
Platelets: 187 10*3/uL (ref 150–400)
RDW: 15.5 % (ref 11.5–15.5)

## 2012-04-14 LAB — COMPREHENSIVE METABOLIC PANEL
AST: 19 U/L (ref 0–37)
Albumin: 3.2 g/dL — ABNORMAL LOW (ref 3.5–5.2)
BUN: 42 mg/dL — ABNORMAL HIGH (ref 6–23)
Calcium: 9.8 mg/dL (ref 8.4–10.5)
Chloride: 95 mEq/L — ABNORMAL LOW (ref 96–112)
Creatinine, Ser: 6.31 mg/dL — ABNORMAL HIGH (ref 0.50–1.35)
Total Bilirubin: 0.2 mg/dL — ABNORMAL LOW (ref 0.3–1.2)

## 2012-04-14 LAB — SURGICAL PCR SCREEN: Staphylococcus aureus: POSITIVE — AB

## 2012-04-14 LAB — TYPE AND SCREEN
ABO/RH(D): A POS
Antibody Screen: NEGATIVE

## 2012-04-14 NOTE — Progress Notes (Addendum)
Primary:Dr. Arlan Organ with [facility] in [place].  Ostomy Nurse came in to mark pt. for possible colostomy if needed during surgery.  Pt. Stated he didn't know anything about this. I encourged pt. To called Dr. Michaell Cowing' office for more clarification on the possible colostomy and to answer any further questions the pt. may have.   Faxed to Dr. Samuel Germany office the OSA stop bang tool for sleep apena. Pt. Scored 4 on the screening during pre-op appointment.  Pt. Goes to dialysis M-W-F at [facility].

## 2012-04-14 NOTE — Pre-Procedure Instructions (Signed)
[name]  04/14/2012   Your procedure is scheduled on:  Thursday, April 16, 2012  Report to [facility] at 11:45 AM.  Call this number if you have problems the morning of surgery: [phone]   Remember:   Do not eat food or drink liquids after midnight.   Take these medicines the morning of surgery with A SIP OF WATER: albuterol, amiodarone, imuran, carvedilol, zyrtec, levothyroxine, midodrine, dilantin, prednisone, prograft. Discontinue aspirin,coumadin, plavix, effient, and herbal medicines.   Do not wear jewelry, make-up or nail polish.  Do not wear lotions, powders, or perfumes. You may wear deodorant.  Do not shave 48 hours prior to surgery. Men may shave face and neck.  Do not bring valuables to the hospital.  Contacts, dentures or bridgework may not be worn into surgery.  Leave suitcase in the car. After surgery it may be brought to your room.  For patients admitted to the hospital, checkout time is 11:00 AM the day of  discharge.   Patients discharged the day of surgery will not be allowed to drive  home.  Name and phone number of your driver:   Special Instructions: Shower using CHG 2 nights before surgery and the night before surgery.  If you shower the day of surgery use CHG.  Use special wash - you have one bottle of CHG for all showers.  You should use approximately 1/3 of the bottle for each shower.   Please read over the following fact sheets that you were given: Pain Booklet, Coughing and Deep Breathing, Blood Transfusion Information, MRSA Information and Surgical Site Infection Prevention

## 2012-04-14 NOTE — Consult Note (Signed)
WOC consult note requested to mark pt during pre-surgical appointment for possible colostomy by Dr Michaell Cowing on 2/13.  Pt is unaware of what a colostomy is or why he might need one after surgery.  Briefly explained ostomy function and showed pt pouch.  He is wheelchair bound and unable to stand or lay down during marking.  Mark placed to left upper abd because lower abd has deep skin fold.  Asked if site is within the line of vision and pt states, "I can't see it wherever it is, I'm legally blind."  Area with marking is 5 cm to left of umbilicus and even with that level, in area free from folds within the rectus muscles.  Pen given to family member at bedside to reinforce the mark if it begins to wear off.  Will plan to follow pt post-op if he obtains a colostomy during surgery.  Cammie Mcgee, RN, MSN, [employer]  [phone]

## 2012-04-15 MED ORDER — CLINDAMYCIN PHOSPHATE 900 MG/50ML IV SOLN
900.0000 mg | INTRAVENOUS | Status: AC
  Administered 2012-04-16: 900 mg via INTRAVENOUS
  Filled 2012-04-15: qty 50

## 2012-04-15 NOTE — Consult Note (Signed)
Anesthesia chart review: Patient is a 52 year old male scheduled for laparoscopic removal of gallbladder, possible partial liver resection by Dr. Michaell Cowing on 04/16/2012.  An ostomy nurse also saw patient at [facility] because she was told there was also the possibility of a need for a colostomy--patient had not been told this and was going to discuss this further with Dr. Michaell Cowing.  He has a complex medical history including end-stage renal disease on hemodialysis MWF ([place]) with failed renal transplant in 1998, DM1 with working pancreatic transplant, nonischemic cardiomyopathy, St. Jude ICD, ventricular tachycardia on chronic amiodarone with failed ablation and subsequent hemopericardium, mild AS by 2009 echo, remote intracranial hemorrhage, hyperlipidemia, GERD, PVD with right AKA and multiple digit amputations, ED, anemia, hypothyroidism, glaucoma, legally blind, secondary hyperparathyroidism, non-smoker.  OSA screening score was a 4.  PCP is Dr. Arlan Organ in [place].    Cardiologist is Dr. Ladona Ridgel.  Patient was seen by Norma Fredrickson, NP for preoperative cardiac clearance on 03/24/2012.  EKG then showed NSR, right BBB, anterolateral infarct (age undetermined).  Her assessment includes, "In light of the patient's numerous problems/issues, he is felt to be at moderate to high risk for any type of surgery. However, given the concern for possible gallbladder cancer/mass as well as his recurrent symptoms of gallbladder disease, surgical intervention will need to be undertaken. He seems to be fairly stable at this time from our standpoint. Would continue with the patient's current medicines. Will need his ICD turned off during the surgery (has a ST Jude device). We can certainly follow him in the post op phase if needed. I do not think further cardiac testing at this time is warranted. I have discussed with Dr. Excell Seltzer (DOD) in Dr. Lubertha Basque absence who is in agreement with this plan. His risk is felt to be moderate to  high with nothing that we can do to change that degree of risk."   Echo on 03/18/07 showed: - Overall left ventricular systolic function was mildly decreased. Left ventricular ejection fraction was estimated to be 45 %. There was akinesis of the inferoseptal wall. There was akinesis of the inferior wall. Doppler parameters were consistent with restrictive physiology, indicative of decreased left ventricular diastolic compliance and/or increased left atrial pressure. Doppler parameters were consistent with high left ventricular filling pressure. - Aortic valve thickness was moderately increased. Findings were consistent with mild aortic valve stenosis. The mean transaortic valve gradient was 13 mmHg. Estimated aortic valve area (by VTI) was 1.41 cm^2. Estimated aortic valve area (by Vmax) was 1.22 cm^2. - There was moderate mitral annular calcification. - The left atrium was moderately dilated. The interatrial septum bows from left to right, consistent with increased left atrial pressure. - There was the appearance of a catheter or pacing wire in the right ventricle.  The last cardiac cath I could locate was from 01/10/04 and showed no significant CAD.  CXR on 03/20/12 showed: Borderline enlargement of cardiac silhouette. COPD. No acute abnormalities.  Preoperative labs noted.  He will get an ISTAT on arrival.  He has been cleared by cardiology with moderate to high risk.  He will be evaluated by his anesthesiologist on the day of surgery. If no significant changes and follow-up labs are reasonable and would anticipate he can proceed as planned.  Shonna Chock, PA-C 04/15/12 1124

## 2012-04-15 NOTE — Progress Notes (Signed)
Notified Helmut Muster, rep. With st. Jude of ICD needing to be turned off prior to surgery. Gave her date and time of surgery, stated pt. would be in holding 1 hour prior to surgery.

## 2012-04-16 ENCOUNTER — Ambulatory Visit (HOSPITAL_COMMUNITY)
Admission: RE | Admit: 2012-04-16 | Discharge: 2012-04-16 | Disposition: A | Discharge status: Home / Self Care | Payer: Medicare Other | Source: Ambulatory Visit | Attending: Surgery | Admitting: Surgery

## 2012-04-16 ENCOUNTER — Encounter (HOSPITAL_COMMUNITY): Payer: Self-pay | Admitting: Vascular Surgery

## 2012-04-16 ENCOUNTER — Encounter (HOSPITAL_COMMUNITY): Payer: Self-pay | Admitting: *Deleted

## 2012-04-16 ENCOUNTER — Encounter (HOSPITAL_COMMUNITY)
Admission: RE | Disposition: A | Discharge status: Home / Self Care | Payer: Self-pay | Source: Ambulatory Visit | Attending: Surgery

## 2012-04-16 ENCOUNTER — Ambulatory Visit (HOSPITAL_COMMUNITY): Payer: Medicare Other | Admitting: Vascular Surgery

## 2012-04-16 ENCOUNTER — Ambulatory Visit (HOSPITAL_COMMUNITY): Payer: Medicare Other

## 2012-04-16 DIAGNOSIS — E109 Type 1 diabetes mellitus without complications: Secondary | ICD-10-CM | POA: Insufficient documentation

## 2012-04-16 DIAGNOSIS — N186 End stage renal disease: Secondary | ICD-10-CM | POA: Insufficient documentation

## 2012-04-16 DIAGNOSIS — K660 Peritoneal adhesions (postprocedural) (postinfection): Secondary | ICD-10-CM | POA: Insufficient documentation

## 2012-04-16 DIAGNOSIS — Z940 Kidney transplant status: Secondary | ICD-10-CM | POA: Insufficient documentation

## 2012-04-16 DIAGNOSIS — Z01812 Encounter for preprocedural laboratory examination: Secondary | ICD-10-CM | POA: Insufficient documentation

## 2012-04-16 DIAGNOSIS — K8010 Calculus of gallbladder with chronic cholecystitis without obstruction: Secondary | ICD-10-CM

## 2012-04-16 DIAGNOSIS — I120 Hypertensive chronic kidney disease with stage 5 chronic kidney disease or end stage renal disease: Secondary | ICD-10-CM | POA: Insufficient documentation

## 2012-04-16 DIAGNOSIS — Z992 Dependence on renal dialysis: Secondary | ICD-10-CM | POA: Insufficient documentation

## 2012-04-16 HISTORY — PX: CHOLECYSTECTOMY: SHX55

## 2012-04-16 LAB — POCT I-STAT 4, (NA,K, GLUC, HGB,HCT)
Glucose, Bld: 78 mg/dL (ref 70–99)
HCT: 40 % (ref 39.0–52.0)
Hemoglobin: 13.6 g/dL (ref 13.0–17.0)
Potassium: 4 mEq/L (ref 3.5–5.1)
Sodium: 139 mEq/L (ref 135–145)

## 2012-04-16 LAB — GLUCOSE, CAPILLARY
Glucose-Capillary: 67 mg/dL — ABNORMAL LOW (ref 70–99)
Glucose-Capillary: 151 mg/dL — ABNORMAL HIGH (ref 70–99)

## 2012-04-16 SURGERY — LAPAROSCOPIC CHOLECYSTECTOMY WITH INTRAOPERATIVE CHOLANGIOGRAM
Anesthesia: General | Wound class: Contaminated

## 2012-04-16 MED ORDER — LIDOCAINE HCL (CARDIAC) 20 MG/ML IV SOLN
INTRAVENOUS | Status: DC | PRN
  Administered 2012-04-16: 40 mg via INTRAVENOUS

## 2012-04-16 MED ORDER — DEXTROSE 50 % IV SOLN
INTRAVENOUS | Status: AC
  Filled 2012-04-16: qty 50

## 2012-04-16 MED ORDER — FENTANYL CITRATE 0.05 MG/ML IJ SOLN
INTRAMUSCULAR | Status: DC | PRN
  Administered 2012-04-16 (×3): 50 ug via INTRAVENOUS

## 2012-04-16 MED ORDER — SODIUM CHLORIDE 0.9 % IV SOLN
300.0000 mg | INTRAVENOUS | Status: DC
  Filled 2012-04-16: qty 7.5

## 2012-04-16 MED ORDER — ONDANSETRON HCL 4 MG/2ML IJ SOLN
INTRAMUSCULAR | Status: DC | PRN
  Administered 2012-04-16: 4 mg via INTRAVENOUS

## 2012-04-16 MED ORDER — FENTANYL CITRATE 0.05 MG/ML IJ SOLN: 25.0000 ug | INTRAMUSCULAR | Status: DC | PRN

## 2012-04-16 MED ORDER — GLYCOPYRROLATE 0.2 MG/ML IJ SOLN
INTRAMUSCULAR | Status: DC | PRN
  Administered 2012-04-16: .6 mg via INTRAVENOUS

## 2012-04-16 MED ORDER — OXYCODONE HCL 5 MG PO TABS: 5.0000 mg | ORAL_TABLET | ORAL | Status: DC | PRN

## 2012-04-16 MED ORDER — MIDAZOLAM HCL 5 MG/5ML IJ SOLN
INTRAMUSCULAR | Status: DC | PRN
  Administered 2012-04-16: 2 mg via INTRAVENOUS

## 2012-04-16 MED ORDER — PROPOFOL 10 MG/ML IV BOLUS
INTRAVENOUS | Status: DC | PRN
  Administered 2012-04-16: 130 mg via INTRAVENOUS

## 2012-04-16 MED ORDER — CHLORHEXIDINE GLUCONATE 4 % EX LIQD: 1.0000 "application " | Freq: Once | CUTANEOUS | Status: DC

## 2012-04-16 MED ORDER — NEOSTIGMINE METHYLSULFATE 1 MG/ML IJ SOLN
INTRAMUSCULAR | Status: DC | PRN
  Administered 2012-04-16: 3 mg via INTRAVENOUS

## 2012-04-16 MED ORDER — 0.9 % SODIUM CHLORIDE (POUR BTL) OPTIME
TOPICAL | Status: DC | PRN
  Administered 2012-04-16: 2000 mL

## 2012-04-16 MED ORDER — SODIUM CHLORIDE 0.9 % IJ SOLN: 3.0000 mL | Freq: Two times a day (BID) | INTRAMUSCULAR | Status: DC

## 2012-04-16 MED ORDER — FENTANYL CITRATE 0.05 MG/ML IJ SOLN
25.0000 ug | INTRAMUSCULAR | Status: DC | PRN
  Administered 2012-04-16 (×4): 50 ug via INTRAVENOUS

## 2012-04-16 MED ORDER — BUPIVACAINE-EPINEPHRINE 0.25% -1:200000 IJ SOLN
INTRAMUSCULAR | Status: DC | PRN
  Administered 2012-04-16: 50 mL

## 2012-04-16 MED ORDER — SODIUM CHLORIDE 0.9 % IV SOLN
INTRAVENOUS | Status: DC | PRN
  Administered 2012-04-16: 16:00:00

## 2012-04-16 MED ORDER — PHENYLEPHRINE HCL 10 MG/ML IJ SOLN
INTRAMUSCULAR | Status: DC | PRN
  Administered 2012-04-16: 40 ug via INTRAVENOUS
  Administered 2012-04-16 (×2): 80 ug via INTRAVENOUS
  Administered 2012-04-16: 50 ug via INTRAVENOUS
  Administered 2012-04-16: 40 ug via INTRAVENOUS

## 2012-04-16 MED ORDER — FENTANYL CITRATE 0.05 MG/ML IJ SOLN
INTRAMUSCULAR | Status: AC
  Administered 2012-04-16: 50 ug via INTRAVENOUS
  Filled 2012-04-16: qty 2

## 2012-04-16 MED ORDER — ONDANSETRON HCL 4 MG/2ML IJ SOLN
4.0000 mg | Freq: Four times a day (QID) | INTRAMUSCULAR | Status: DC | PRN
  Administered 2012-04-16: 4 mg via INTRAVENOUS

## 2012-04-16 MED ORDER — SODIUM CHLORIDE 0.9 % IV SOLN: 250.0000 mL | INTRAVENOUS | Status: DC | PRN

## 2012-04-16 MED ORDER — SODIUM CHLORIDE 0.9 % IR SOLN
Status: DC | PRN
  Administered 2012-04-16: 2000 mL

## 2012-04-16 MED ORDER — SODIUM CHLORIDE 0.9 % IV SOLN
INTRAVENOUS | Status: DC
  Administered 2012-04-16 (×2): via INTRAVENOUS

## 2012-04-16 MED ORDER — DEXTROSE 50 % IV SOLN
50.0000 mL | Freq: Once | INTRAVENOUS | Status: AC
  Administered 2012-04-16: 14:00:00 via INTRAVENOUS

## 2012-04-16 MED ORDER — ACETAMINOPHEN 325 MG PO TABS: 650.0000 mg | ORAL_TABLET | ORAL | Status: DC | PRN

## 2012-04-16 MED ORDER — BUPIVACAINE-EPINEPHRINE 0.25% -1:200000 IJ SOLN
INTRAMUSCULAR | Status: AC
  Filled 2012-04-16: qty 1

## 2012-04-16 MED ORDER — ONDANSETRON HCL 4 MG/2ML IJ SOLN
INTRAMUSCULAR | Status: AC
  Administered 2012-04-16: 4 mg via INTRAVENOUS
  Filled 2012-04-16: qty 2

## 2012-04-16 MED ORDER — OXYCODONE HCL 5 MG PO TABS
5.0000 mg | ORAL_TABLET | ORAL | Status: DC | PRN
  Administered 2012-04-16: 5 mg via ORAL

## 2012-04-16 MED ORDER — ROCURONIUM BROMIDE 100 MG/10ML IV SOLN
INTRAVENOUS | Status: DC | PRN
  Administered 2012-04-16: 10 mg via INTRAVENOUS
  Administered 2012-04-16: 20 mg via INTRAVENOUS
  Administered 2012-04-16: 10 mg via INTRAVENOUS
  Administered 2012-04-16: 40 mg via INTRAVENOUS

## 2012-04-16 MED ORDER — FENTANYL CITRATE 0.05 MG/ML IJ SOLN
INTRAMUSCULAR | Status: AC
Start: 2012-04-16 — End: 2012-04-16
  Administered 2012-04-16: 50 ug via INTRAVENOUS
  Filled 2012-04-16: qty 2

## 2012-04-16 MED ORDER — OXYCODONE HCL 5 MG PO TABS
ORAL_TABLET | ORAL | Status: AC
  Administered 2012-04-16: 5 mg via ORAL
  Filled 2012-04-16: qty 1

## 2012-04-16 MED ORDER — SODIUM CHLORIDE 0.9 % IJ SOLN: 3.0000 mL | INTRAMUSCULAR | Status: DC | PRN

## 2012-04-16 MED ORDER — ACETAMINOPHEN 650 MG RE SUPP: 650.0000 mg | RECTAL | Status: DC | PRN

## 2012-04-16 MED ORDER — GENTAMICIN SULFATE 40 MG/ML IJ SOLN
5.0000 mg/kg | INTRAVENOUS | Status: DC
  Administered 2012-04-16: 300 mg via INTRAVENOUS

## 2012-04-16 SURGICAL SUPPLY — 44 items
APPLIER CLIP ROT 10 11.4 M/L (STAPLE) ×2
APR CLP MED LRG 11.4X10 (STAPLE) ×1
BAG SPEC RTRVL LRG 6X4 10 (ENDOMECHANICALS) ×1
BLADE SURG ROTATE 9660 (MISCELLANEOUS) ×1 IMPLANT
CANISTER SUCTION 2500CC (MISCELLANEOUS) ×2 IMPLANT
CLIP APPLIE ROT 10 11.4 M/L (STAPLE) ×1 IMPLANT
CLOTH BEACON ORANGE TIMEOUT ST (SAFETY) ×2 IMPLANT
COVER MAYO STAND STRL (DRAPES) ×2 IMPLANT
COVER SURGICAL LIGHT HANDLE (MISCELLANEOUS) ×2 IMPLANT
DECANTER SPIKE VIAL GLASS SM (MISCELLANEOUS) ×2 IMPLANT
DRAPE C-ARM 42X72 X-RAY (DRAPES) ×2 IMPLANT
DRAPE WARM FLUID 44X44 (DRAPE) ×2 IMPLANT
DRSG TEGADERM 4X4.75 (GAUZE/BANDAGES/DRESSINGS) ×2 IMPLANT
ELECT REM PT RETURN 9FT ADLT (ELECTROSURGICAL) ×2
ELECTRODE REM PT RTRN 9FT ADLT (ELECTROSURGICAL) ×1 IMPLANT
ENDOLOOP SUT PDS II  0 18 (SUTURE) ×1
ENDOLOOP SUT PDS II 0 18 (SUTURE) IMPLANT
GAUZE SPONGE 2X2 8PLY STRL LF (GAUZE/BANDAGES/DRESSINGS) IMPLANT
GLOVE BIOGEL PI IND STRL 8 (GLOVE) ×1 IMPLANT
GLOVE BIOGEL PI INDICATOR 8 (GLOVE) ×1
GLOVE ECLIPSE 8.0 STRL XLNG CF (GLOVE) ×2 IMPLANT
GOWN PREVENTION PLUS XLARGE (GOWN DISPOSABLE) ×2 IMPLANT
GOWN STRL NON-REIN LRG LVL3 (GOWN DISPOSABLE) ×4 IMPLANT
KIT BASIN OR (CUSTOM PROCEDURE TRAY) ×2 IMPLANT
KIT ROOM TURNOVER OR (KITS) ×2 IMPLANT
NEEDLE 22X1 1/2 (OR ONLY) (NEEDLE) ×2 IMPLANT
NS IRRIG 1000ML POUR BTL (IV SOLUTION) ×2 IMPLANT
PAD ARMBOARD 7.5X6 YLW CONV (MISCELLANEOUS) ×4 IMPLANT
POUCH SPECIMEN RETRIEVAL 10MM (ENDOMECHANICALS) ×1 IMPLANT
SCISSORS LAP 5X35 DISP (ENDOMECHANICALS) ×2 IMPLANT
SET CHOLANGIOGRAPH 5 50 .035 (SET/KITS/TRAYS/PACK) ×2 IMPLANT
SET IRRIG TUBING LAPAROSCOPIC (IRRIGATION / IRRIGATOR) ×2 IMPLANT
SLEEVE Z-THREAD 5X100MM (TROCAR) ×2 IMPLANT
SPECIMEN JAR SMALL (MISCELLANEOUS) ×2 IMPLANT
SPONGE GAUZE 2X2 STER 10/PKG (GAUZE/BANDAGES/DRESSINGS) ×1
SUT MNCRL AB 4-0 PS2 18 (SUTURE) ×2 IMPLANT
SUT VICRYL 0 TIES 12 18 (SUTURE) IMPLANT
TOWEL OR 17X24 6PK STRL BLUE (TOWEL DISPOSABLE) ×2 IMPLANT
TOWEL OR 17X26 10 PK STRL BLUE (TOWEL DISPOSABLE) ×2 IMPLANT
TRAY LAPAROSCOPIC (CUSTOM PROCEDURE TRAY) ×2 IMPLANT
TROCAR XCEL NON-BLD 5MMX100MML (ENDOMECHANICALS) ×2 IMPLANT
TROCAR Z-THREAD FIOS 11X100 BL (TROCAR) ×2 IMPLANT
TROCAR Z-THREAD FIOS 5X100MM (TROCAR) ×2 IMPLANT
WATER STERILE IRR 1000ML POUR (IV SOLUTION) IMPLANT

## 2012-04-16 NOTE — Progress Notes (Signed)
Pt. With AICD.  Orders from Dr. Ladona Ridgel state that no need for post op interrogation.

## 2012-04-16 NOTE — Anesthesia Procedure Notes (Signed)
Procedure Name: Intubation Date/Time: 04/16/2012 2:25 PM Performed by: Angelica Pou Pre-anesthesia Checklist: Patient identified, Timeout performed, Emergency Drugs available, Suction available and Patient being monitored Patient Re-evaluated:Patient Re-evaluated prior to inductionOxygen Delivery Method: Circle system utilized Preoxygenation: Pre-oxygenation with 100% oxygen Intubation Type: IV induction Ventilation: Two handed mask ventilation required and Oral airway inserted - appropriate to patient size Laryngoscope Size: Mac and 3 Grade View: Grade II Tube type: Oral Tube size: 8.0 mm Number of attempts: 1 Airway Equipment and Method: Stylet and Oral airway Placement Confirmation: ETT inserted through vocal cords under direct vision,  breath sounds checked- equal and bilateral and positive ETCO2 Secured at: 22 cm Tube secured with: Tape Dental Injury: Teeth and Oropharynx as per pre-operative assessment  Comments: Difficult mask d/t beard.

## 2012-04-16 NOTE — Anesthesia Postprocedure Evaluation (Signed)
Anesthesia Post Note  Patient: [name]  Procedure(s) Performed: Procedure(s) (LRB): LAPAROSCOPIC CHOLECYSTECTOMY WITH INTRAOPERATIVE CHOLANGIOGRAM (N/A)  Anesthesia type: General  Patient location: PACU  Post pain: Pain level controlled and Adequate analgesia  Post assessment: Post-op Vital signs reviewed, Patient's Cardiovascular Status Stable, Respiratory Function Stable, Patent Airway and Pain level controlled  Last Vitals:  Filed Vitals:   04/16/12 1615  BP: 123/51  Pulse: 60  Temp:   Resp: 12    Post vital signs: Reviewed and stable  Level of consciousness: awake, alert  and oriented  Complications: No apparent anesthesia complications

## 2012-04-16 NOTE — H&P (Signed)
[name]  04-27-1960 [id number]   This patient is a 52 y.o.male who presents today for surgical evaluation at the request of Dr. Webb Silversmith [facility].  Reason for evaluation: Gallbladder mass. Concern of cancer.   Pleasant male with numerous health issues. Renal failure. Gets hemodialysis Monday Wednesday Friday throughout left thigh AV shunt. 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 Pancreas transplant still works. Kidney transplant failed. Had a prior right nephrectomy. Chronic heart failure followed by Dr. Lewayne Bunting. Has pacemaker. Diabetes. Has had had amputations of numerous digits. Has had a right above-the-knee amputation as well. In a wheelchair  Has had episodes of postprandial nausea and bloating. Some reflux-like belching as well. Associated with most foods. This happened a few months ago in September. Gradually improve. Has some chronic diarrhea usually controlled with Imodium. That has been stable. His abdominal complaints decreased. Underwent a CAT scan. Found to have a mass in his gallbladder. Ultrasound was concerning for a polyp-like mass. Not c/w gallstone. Malignancy could not be ruled out. No evidence of liver disease or metastases. His had a CT scan in 2010 and that mentioned a large gallstone. Based on concerns he was sent to gastroenterology who after discussing with the patient's nephrologist recommended surgical valuation. Patient is eating better now. Weight has been stable.  No personal nor family history of GI/colon cancer, inflammatory bowel disease, irritable bowel syndrome, allergy such as Celiac Sprue, dietary/dairy problems, colitis, ulcers nor gastritis. No recent sick contacts/gastroenteritis. No travel outside the country. No changes in diet.   Cleared for surgery.  No new events/pain    Past Medical History  Diagnosis Date  . Secondary hyperparathyroidism, renal     In setting of ESRD.  [name] Hypothyroidism    . Automatic implantable cardiac defibrillator in situ     CRT-ICD   . Hypertension   . Nonischemic cardiomyopathy     2D-echocardiogram (08/2007) - LV EF 45%, akinesis of inferoseptal wall, inferior wall,   . ESRD on hemodialysis Started age 48yo    on MWF schedule. Previous failed renal transplant 1998 at [facility] (with rejection in 1998). 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 Resumed HD 10/2006.   [name] DM type 1 (diabetes mellitus, type 1) DX: age 42yo    previously well controlled with Aic 5 (12/2008)  . Intracranial hemorrhage     History of. On prophylactic dilantin.  . 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  . Hyperlipidemia   . Hemopericardium     History of in setting of failed radiogrequency ablation for Vtach  . Anemia of chronic disease     BL Hgb 11-13. In setting of ESRD.   [name] Peptic ulcer disease     Unknown history, no records per [employer].   . Cholelithiasis     Noted at least since 01/2006. Asymptomatic.  . Erectile dysfunction   . Peripheral autonomic neuropathy due to diabetes mellitus   . GERD (gastroesophageal reflux disease)   . Glaucoma(365)   . Squamous cell skin cancer, finger     History of. Marginal resection in 05/2008 with recurrent infection/ lytic lesion involving distal phalanx  . Toe amputation status     Ischemic fourth and fifith toe left (03/2009), history of previous 1,2,3 toe  amputations.   [name] MRSA (methicillin resistant staph aureus) culture positive     Verify type -  Per medical history form dated 02/13/10.  [name] Poor circulation     per medical history form dated 02/13/10.  . Ventricular tachycardia     appropriate ATP and Shocks 12/12  . RIATA LEAD     EXTERNALIZATION 12/12 (sk) // high RV threshold  . S/P BKA (below knee amputation)     right  . Arthritis   . Blood transfusion without reported diagnosis   .  Heart murmur   . Coronary artery disease   . ICD (implantable cardiac defibrillator) in place   . Legally blind   . Arteriovenous graft for hemodialysis in place, primary 2009    left leg, had a previous graft in right arm that occulded    Past Surgical History  Procedure Laterality Date  . Cardiac defibrillator placement  11/05    St Jude  . Transplant pancreatic allograft  2002  . Toe amputation  03/2009    left 4th, 5th toes. Previous 1,2, 3 toe left.  . Kidney transplant  2002  . Above knee leg amputation  11/09/10    Right AKA  . Ablation      2006    History   Social History  . Marital Status: Married    Spouse Name: N/A    Number of Children: 0  . Years of Education: 12th grade   Occupational History  . UNEMPLOYED    Social History Main Topics  . Smoking status: Never Smoker   . Smokeless tobacco: Never Used  . 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 Drug Use: No  . Sexually Active: Not on file   Other Topics Concern  . Not on file   Social History Narrative   The patient lives in [place] with his wife and stepdaughter.  He denies any tobacco use.  Does have a history of  previous heavy alcohol use, but has been clean since 1987 and denies any illicit drug use.   He is a retired [employer].     Family History  Problem Relation Age of Onset  . Hypertension Brother   . Hypertension Sister   . Coronary artery disease Mother     x 5 vessel graft in her 59s  . Hypertension Mother   . Prostate cancer Father   . Hypertension Father   . Cancer Father     Prostate  . Hypertension Brother     Current Facility-Administered Medications  Medication Dose Route Frequency Provider Last Rate Last Dose  . 0.9 %  sodium chloride infusion   Intravenous Continuous Ardeth Sportsman, MD 20 mL/hr at 04/16/12 1251    . chlorhexidine (HIBICLENS) 4 % liquid 1 application  1 application Topical Once Ardeth Sportsman, MD      . Melene Muller  ON 04/17/2012] chlorhexidine (HIBICLENS) 4 % liquid 1 application  1 application Topical Once Ardeth Sportsman, MD      . clindamycin (CLEOCIN) IVPB 900 mg  900 mg Intravenous On Call to OR Ardeth Sportsman, MD         Allergies  Allergen Reactions  . Avelox (Moxifloxacin Hcl In Nacl)     Ask patient to clarify type of medication. Also need to know type/severity/reaction.  . Ceftriaxone Sodium Nausea And Vomiting  . Dilaudid (Hydromorphone Hcl)     sensitivity  . Protamine     Significant hypotension?  . Rocephin (Ceftriaxone Sodium In Dextrose)     Ask patient to clarify type  of medication. Also need to know type/severity/reaction.  . Ciprofloxacin Rash  . Moxifloxacin Rash     BP 135/73  Pulse 74  Temp(Src) 98.2 F (36.8 C) (Oral)  Resp 20  SpO2 93%     Results:   Labs: Results for orders placed during the hospital encounter of 04/16/12 (from the past 48 hour(s))  POCT I-STAT 4, (NA,K, GLUC, HGB,HCT)     Status: None   Collection Time    04/16/12 11:29 AM      Result Value Range   Sodium 139  135 - 145 mEq/L   Potassium 4.0  3.5 - 5.1 mEq/L   Glucose, Bld 78  70 - 99 mg/dL   HCT 16.1  09.6 - 04.5 %   Hemoglobin 13.6  13.0 - 17.0 g/dL    Imaging / Studies: Dg Chest 2 View  03/20/2012  *RADIOLOGY REPORT*  Clinical Data: Cough, abdominal pain  CHEST - 2 VIEW  Comparison: 10/21/2011  Findings: Right subclavian transvenous AICD leads project over right atrium, right ventricle and coronary sinus. A single wire loops over the anterior upper left chest. Borderline enlargement of cardiac silhouette post median sternotomy. Mediastinal contours and pulmonary vascularity normal. Emphysematous and bronchitic changes without pulmonary infiltrate, pleural effusion or pneumothorax. Bones demineralized with note of multiple old left rib fractures.  IMPRESSION: Borderline enlargement of cardiac silhouette. COPD. No acute abnormalities.   Original Report Authenticated By: Ulyses Southward, M.D.      Medications / Allergies: per chart  Antibiotics: Anti-infectives   Start     Dose/Rate Route Frequency Ordered Stop   04/16/12 0600  clindamycin (CLEOCIN) IVPB 900 mg     900 mg 100 mL/hr over 30 Minutes Intravenous On call to O.R. 04/15/12 1424 04/17/12 0559      Assessment  Wayna Chalet  52 y.o. male  Day of Surgery  Procedure(s): Review of Systems  Constitutional: Negative for fever, chills and diaphoresis.  HENT: Negative for nosebleeds, sore throat, facial swelling, mouth sores, trouble swallowing and ear discharge.  Eyes: Positive for visual disturbance. Negative for photophobia and discharge.  Respiratory: Negative for choking, chest tightness, shortness of breath and stridor.  Cardiovascular: Negative for chest pain and palpitations.  Gastrointestinal: Positive for diarrhea. Negative for nausea, vomiting, abdominal pain, constipation, blood in stool, abdominal distention, anal bleeding and rectal pain.  Genitourinary: Negative for dysuria, urgency, difficulty urinating and testicular pain.  Musculoskeletal: Positive for gait problem. Negative for myalgias, back pain and arthralgias.  Skin: Negative for color change, pallor, rash and wound.  Neurological: Negative for dizziness, speech difficulty, weakness, numbness and headaches.  Hematological: Negative for adenopathy. Does not bruise/bleed easily.  Psychiatric/Behavioral: Negative for hallucinations, confusion and agitation.  Objective:   Physical Exam  Constitutional: He is oriented to person, place, and time. He appears well-developed and well-nourished. No distress.  HENT:  Head: Normocephalic.  Mouth/Throat: Oropharynx is clear and moist. No oropharyngeal exudate.  Eyes: Conjunctivae normal and EOM are normal. Pupils are equal, round, and reactive to light. No scleral icterus.  Neck: Normal range of motion. Neck supple. No tracheal deviation present.  Cardiovascular: Normal rate, regular rhythm and intact  distal pulses.  Pulmonary/Chest: Effort normal and breath sounds normal. No respiratory distress.  Abdominal: Soft. Bowel sounds are normal. He exhibits no distension, no fluid wave and no ascites. There is no tenderness. There is no rigidity, no guarding, no CVA tenderness, no tenderness at McBurney's point and negative Murphy's sign. No hernia. Hernia confirmed negative in  the right inguinal area and confirmed negative in the left inguinal area.    Musculoskeletal: Normal range of motion. He exhibits no tenderness.  AV shunt in left thigh. R AKA. Partially amputated left finger  Lymphadenopathy:  He has no cervical adenopathy.  Right: No inguinal adenopathy present.  Left: No inguinal adenopathy present.  Neurological: He is alert and oriented to person, place, and time. No cranial nerve deficit. He exhibits normal muscle tone. Coordination normal.  Skin: Skin is warm and dry. No rash noted. He is not diaphoretic. No erythema. No pallor.  Psychiatric: He has a normal mood and affect. His behavior is normal. Judgment and thought content normal.  Assessment:   Intraluminal gallbladder mass suspicious for polyp. Stable in size for many years makes cancer less likely although not definitely ruled out. No evidence of metastatic disease.   Plan:   I spent over an hour seeing the patient, reviewing the chart/studies, and trying to sort him out. Complicated medical history.  I reviewed the films with radiology and my partner, Dr. Almond Lint, whom is a hepatobiliary pancreatic surgeon. I suspect this is not a cancer & just a large polyp Since there has been a mass in the gallbladder since 2010 and is rather stable over the past three years. There is no obvious invasion into the liver on CT scan or ultrasound. Getting an MRI of the abdomen be helpful. However, his pacemaker prevents this.   Given the fact he has not intraluminal gallbladder mass and has had some symptoms suspicious for biliary colic,  I think he would benefit from cholecystectomy. Would start out laparoscopically. Numerous prior abdominal surgeries make single site not an option. We do classic for port. I did caution him that there is a possibility he would need a partial wedge resection if there is evidence of invasion into the liver. I think a formal hepatectomy would be too stressful in this patient. The patient would like to avoid a major resection but does agree does not make sense to keep the mass in his gallbladder.   The anatomy & physiology of hepatobiliary & pancreatic function was discussed. The pathophysiology of gallbladder dysfunction was discussed. Natural history risks without surgery was discussed. I feel the risks of no intervention will lead to serious problems that outweigh the operative risks; therefore, I recommended cholecystectomy to remove the pathology. I explained laparoscopic techniques with possible need for an open approach. Probable cholangiogram to evaluate the bilary tract was explained as well.Possibility of converting to open was discussed. Possibility of needing a placed a wedge resection of the liver or more complex mild dissection to safely remove the mass en bloc suspicion of cancer is higher. We will try to avoid a major hepatic resection as the morbidity of that is much higher.  Risks such as bleeding, infection, abscess, leak, injury to other organs, need for further treatment, heart attack, death, and other risks were discussed. I noted a good likelihood this will help address the problem. Possibility that this will not correct all abdominal symptoms was explained. Goals of post-operative recovery were discussed as well. We will work to minimize complications. An educational handout further explaining the pathology and treatment options was given as well. Questions were answered. The patient expresses understanding & wishes to proceed with surgery.    CA 19-9 only 19 = more reassuring for a more  benign process.   Cardiac clearance done. His operative risks are obviously much higher than average.  Ardeth Sportsman, M.D., F.A.C.S. Gastrointestinal and Minimally Invasive Surgery [facility] [address] [place], [place] [address] [phone] Main / Paging [phone] Voice Mail   04/16/2012  CARE TEAM:  PCP: Everlean Cherry  Outpatient Care Team: Patient Care Team: Lorin Picket. Martha Clan as PCP - General (Family Medicine) Garnetta Buddy, MD (Nephrology) Marinus Maw, MD as Consulting Physician (Cardiology) Webb Silversmith as Consulting Physician (Gastroenterology) Zada Girt, MD as Consulting Physician (Nephrology)  Inpatient Treatment Team: Treatment Team: Attending Provider: Ardeth Sportsman, MD

## 2012-04-16 NOTE — Anesthesia Preprocedure Evaluation (Addendum)
Anesthesia Evaluation  Patient identified by MRN, date of birth, ID band Patient awake, Patient confused and Patient unresponsive    Reviewed: Allergy & Precautions, H&P , NPO status , Patient's Chart, lab work & pertinent test results, reviewed documented beta blocker date and time   History of Anesthesia Complications Negative for: history of anesthetic complications  Airway Mallampati: I TM Distance: >3 FB Neck ROM: Full    Dental  (+) Teeth Intact and Dental Advisory Given   Pulmonary asthma ,  Mild, uses albuterol once every few mos.   + decreased breath sounds      Cardiovascular hypertension, Pt. on medications and Pt. on home beta blockers + CAD and + Peripheral Vascular Disease + dysrhythmias + Cardiac Defibrillator + Valvular Problems/Murmurs AS Rhythm:Regular Rate:Normal  CHF - ICD placed 2005 EF 45% Mild AS, R BBB    Neuro/Psych    GI/Hepatic PUD, GERD-  Medicated and Controlled,  Endo/Other  diabetes, Well ControlledHypothyroidism Pancreas txplant 2002, no DM meds currently required.   Renal/GU CRF and DialysisRenal diseaseFailed kidney txplant x 2 HD yesterday - K= 4.0     Musculoskeletal   Abdominal   Peds  Hematology   Anesthesia Other Findings   Reproductive/Obstetrics                         Anesthesia Physical Anesthesia Plan  ASA: IV  Anesthesia Plan: General   Post-op Pain Management:    Induction: Intravenous  Airway Management Planned: Oral ETT  Additional Equipment:   Intra-op Plan:   Post-operative Plan: Possible Post-op intubation/ventilation  Informed Consent: I have reviewed the patients History and Physical, chart, labs and discussed the procedure including the risks, benefits and alternatives for the proposed anesthesia with the patient or authorized representative who has indicated his/her understanding and acceptance.   Dental advisory  given  Plan Discussed with: CRNA, Anesthesiologist and Surgeon  Anesthesia Plan Comments:         Anesthesia Quick Evaluation

## 2012-04-16 NOTE — Progress Notes (Signed)
Notified [name] of pt's arrival to short stay. Stated she will have a rep. In holding at 1230.

## 2012-04-16 NOTE — Transfer of Care (Signed)
Immediate Anesthesia Transfer of Care Note  Patient: [name]  Procedure(s) Performed: Procedure(s): LAPAROSCOPIC CHOLECYSTECTOMY WITH INTRAOPERATIVE CHOLANGIOGRAM (N/A)  Patient Location: PACU  Anesthesia Type:General  Level of Consciousness: awake, alert , patient cooperative and lethargic  Airway & Oxygen Therapy: Patient Spontanous Breathing and Patient connected to nasal cannula oxygen  Post-op Assessment: Report given to PACU RN, Post -op Vital signs reviewed and stable and Patient moving all extremities  Post vital signs: Reviewed and stable  Complications: No apparent anesthesia complications

## 2012-04-16 NOTE — Preoperative (Signed)
Beta Blockers   Reason not to administer Beta Blockers:Not Applicable, took carvedilol w/in 24h

## 2012-04-16 NOTE — Op Note (Signed)
04/16/2012  3:51 PM  PATIENT:  [name]  52 y.o. male  Patient Care Team: Lorin Picket. Martha Clan as PCP - General (Family Medicine) Garnetta Buddy, MD (Nephrology) Marinus Maw, MD as Consulting Physician (Cardiology) Webb Silversmith as Consulting Physician (Gastroenterology) Zada Girt, MD as Consulting Physician (Nephrology)  PRE-OPERATIVE DIAGNOSIS:  Gallbladder thickening, possible cancer   POST-OPERATIVE DIAGNOSIS:  Severe chronic cholecystitis with gallstones  PROCEDURE:   LAPAROSCOPIC LYSIS OF ADHESIONS LAPAROSCOPIC CHOLECYSTECTOMY WITH INTRAOPERATIVE CHOLANGIOGRAM  SURGEON:  Surgeon(s): Ardeth Sportsman, MD Almond Lint, MD - Assiting  ANESTHESIA:   local and general  EBL:  Total I/O In: 800 [I.V.:800] Out: -   Delay start of Pharmacological VTE agent (>24hrs) due to surgical blood loss or risk of bleeding:  no  DRAINS: none   SPECIMEN:  Source of Specimen:  Gallbladder  DISPOSITION OF SPECIMEN:  PATHOLOGY  COUNTS:  YES  PLAN OF CARE: Discharge to home after PACU  PATIENT DISPOSITION:  PACU - hemodynamically stable.  INDICATION: Pt with gallbladder wall thickening on CT scan concerning for cancer but no LN/liver mass & CA 19-9 WNL.  Recommendation was made for a diagnostic laparoscopy with cholecystectomy and cholangiogram.  Possibility of wedge resection of gallbladder fossa of the liver partial hepatectomy was discussed if this truly was a cancer.  I had my partner in surgical oncology and hepatobiliopancreatic surgery, Dr. Donell Beers, assisting during the case.  The anatomy & physiology of hepatobiliary & pancreatic function was discussed.  The pathophysiology of gallbladder dysfunction was discussed.  Natural history risks without surgery was discussed.   I feel the risks of no intervention will lead to serious problems that outweigh the operative risks; therefore, I recommended cholecystectomy to remove the pathology.  I explained laparoscopic techniques with  possible need for an open approach.  Probable cholangiogram to evaluate the bilary tract was explained as well.    Risks such as bleeding, infection, abscess, leak, injury to other organs, need for further treatment, heart attack, death, and other risks were discussed.  I noted a good likelihood this will help address the problem.  Possibility that this will not correct all abdominal symptoms was explained.  Goals of post-operative recovery were discussed as well.  We will work to minimize complications.  An educational handout further explaining the pathology and treatment options was given as well.  Questions were answered.  The patient expresses understanding & wishes to proceed with surgery.  OR FINDINGS: Disability lesions.  Shrunken a white leathery gallbladder containing bile and sandy granular sludge/stones.  No evidence of lymphadenopathy.  No evidence of liver mass.  No rocky firm area suspicious for cancer.  DESCRIPTION:   The patient was identified & brought into the operating room. The patient was positioned supine with arms tucked. SCDs were active during the entire case. The patient underwent general anesthesia without any difficulty.  The abdomen was prepped and draped in a sterile fashion. A Surgical Timeout confirmed our plan.  We positioned the patient in reverse [place] & right side up.  I placed a 5mm laparoscopic port through the abdominal wall using optical entry technique in the right upper quadrant.  Entry was clean.  We induced carbon dioxide insufflation. Camera inspection revealed no injury. There were no adhesions to the Left upper quadrant abdominal wall supraumbilically.  He did have some adhesions of small bowel and in the suprapubic region but we left those alone.  I proceeded to continue with laparoscopic technique. I placed a #  5 port in umbilical region, another 5mm port in the right flank near the anterior axillary line, and a 10mm port in the left subxiphoid region  obliquely within the falciform ligament.  I turned attention to the right upper quadrant.  Could barely see the dome of the gallbladder covered with omentum.  I elevated the the omentum off.  I used cautery and ultrasonic harmonic dissection to free the dense greater omental adhesions off the gallbladder.  In doing this I had a small breach in the gallbladder with spillage of bile and stones.  This was controlled.  With the gallbladder decompressed we were able to finally grasp the thickwalled gallbladder wall.  The gallbladder fundus was elevated cephalad. I used hook cautery to free the peritoneal coverings between the gallbladder and the liver on the posteriolateral and anteriomedial walls.     There were dense adhesions.  Decided convert to dome down technique and procedure use ultrasonic dissection to free the dome the gallbladder off the gallbladder fossa.  We did at take a 5-10 mm margin of the gallbladder fossa liver with it.  Eventually came down to taper to a narrow infundibulum.  I used careful blunt ultrasonic dissection to help get a good critical view of the cystic artery and cystic duct. I did further dissection to free a few centimeters of the  gallbladder off the liver bed to get a good critical view of the infundibulum and cystic duct. I mobilized the cystic artery; and, after getting a good 360 view, ligated the cystic artery using Harmonic scalpel. I skeletonized the cystic duct.  I placed a clip on the infundibulum. I did a partial cystic duct-otomy and ensured patency. I placed a 5 [place] cholangiocatheter through a puncture site at the right subcostal ridge of the abdominal wall and directed it into the cystic duct.  We ran a cholangiogram with dilute radio-opaque contrast and continuous fluoroscopy. Contrast flowed from a side branch consistent with cystic duct cannulization. Contrast flowed up the common hepatic duct into the right and left intrahepatic chains out to secondary  radicals. Contrast flowed down the common bile duct easily across the normal ampulla into the duodenum.  This was consistent with a normal cholangiogram.There is no evidence of any ulceration or variation anatomy.  No kinking.  Flow went into the duodenum across a normal-appearing ampulla.  I removed the cholangiocatheter. I placed clips on the cystic duct x3.  I completed cystic duct transection. Because the cystic duct was rather thickwalled And the clips can only get about 90% across the bile duct, I ligated the stump with a 0 PDS Endoloop.  I removed the gallbladder Inside the Endo Catch bag.   We did reinspection.  Hemostasis was good in the gallbladder fossa.  We did a spatula-tip cautery to help ensure good hemostasis.  We did copious urination of several liters of crystalloid solution.  Hemostasis was excellent.  Clips intact on the cystic duct.  We evacuated carbon dioxide through the ports & removed the ports.   I closed the subxiphoid fascia transversely using 0 Vicryl interrupted stitches.  We closed the skin using 4-0 monocryl stitch.  Sterile dressings were applied. The patient was extubated & arrived in the PACU in stable condition..  I had discussed postoperative care with the patient in the holding area.   I am about to locate the patient's family and discuss operative findings and postoperative goals / instructions.  Instructions are written in the chart as well.The patient really  wishes to get home.  We will see if that is feasible.

## 2012-04-20 ENCOUNTER — Telehealth (INDEPENDENT_AMBULATORY_CARE_PROVIDER_SITE_OTHER): Payer: Self-pay

## 2012-04-20 ENCOUNTER — Encounter (HOSPITAL_COMMUNITY): Payer: Self-pay | Admitting: Surgery

## 2012-04-20 ENCOUNTER — Telehealth (INDEPENDENT_AMBULATORY_CARE_PROVIDER_SITE_OTHER): Payer: Self-pay | Admitting: General Surgery

## 2012-04-20 NOTE — Telephone Encounter (Signed)
Called pt's wife to notify them of the good news about the path to be benign per Dr Michaell Cowing.

## 2012-04-20 NOTE — Telephone Encounter (Signed)
Pt's wife called to ask about pt's poor appetite and lack of energy.  He had lap chole on 04/16/12.  Currently on no pain meds; denies fever or discomfort.  Pt is on hemodialysis.  Reassured wife that his appetite will return slowly.  Meanwhile offer small amounts frequently, especially starches which will not upset his stomach.  He is having regular bowel movements or some diarrhea, which he was prone to have before surgery.  Wife inquired about using Ensure supplements; gave okay to try these as well.  Scheduled his post op appt on 05/12/12, as this will not interfere with his dialysis on MWF.

## 2012-04-23 ENCOUNTER — Encounter: Payer: Self-pay | Admitting: Internal Medicine

## 2012-04-23 ENCOUNTER — Ambulatory Visit (INDEPENDENT_AMBULATORY_CARE_PROVIDER_SITE_OTHER): Payer: Medicare Other | Admitting: Internal Medicine

## 2012-04-23 VITALS — BP 108/55 | HR 75 | Ht 70.5 in | Wt 135.0 lb

## 2012-04-23 DIAGNOSIS — I5022 Chronic systolic (congestive) heart failure: Secondary | ICD-10-CM

## 2012-04-23 DIAGNOSIS — Z95810 Presence of automatic (implantable) cardiac defibrillator: Secondary | ICD-10-CM

## 2012-04-23 DIAGNOSIS — I428 Other cardiomyopathies: Secondary | ICD-10-CM

## 2012-04-23 LAB — ICD DEVICE OBSERVATION
AL AMPLITUDE: 2.9 mv
AL IMPEDENCE ICD: 525 Ohm
DEVICE MODEL ICD: 614310
FVT: 0
HV IMPEDENCE: 40 Ohm
LV LEAD THRESHOLD: 0.75 V
MODE SWITCH EPISODES: 3
RV LEAD AMPLITUDE: 2.4 mv
TOT-0007: 1
TOT-0008: 0
TOT-0010: 11
TZAT-0012SLOWVT: 200 ms
TZAT-0013FASTVT: 1
TZAT-0013SLOWVT: 4
TZAT-0018FASTVT: NEGATIVE
TZAT-0018SLOWVT: NEGATIVE
TZAT-0019SLOWVT: 7.5 V
TZAT-0020FASTVT: 1 ms
TZAT-0020SLOWVT: 1 ms
TZON-0003FASTVT: 280 ms
TZON-0003SLOWVT: 340 ms
TZON-0004SLOWVT: 20
TZON-0005FASTVT: 6
TZON-0005SLOWVT: 6
TZON-0010FASTVT: 40 ms
TZST-0001FASTVT: 2
TZST-0001FASTVT: 4
TZST-0001SLOWVT: 4
TZST-0001SLOWVT: 5
TZST-0003FASTVT: 40 J
TZST-0003FASTVT: 40 J
TZST-0003SLOWVT: 30 J
VF: 0

## 2012-04-23 NOTE — Patient Instructions (Addendum)
Your physician wants you to follow-up in: 12 months with Dr Court Joy will receive a reminder letter in the mail two months in advance. If you don't receive a letter, please call our office to schedule the follow-up appointment.   Remote monitoring is used to monitor your Pacemaker of ICD from home. This monitoring reduces the number of office visits required to check your device to one time per year. It allows [place] to keep an eye on the functioning of your device to ensure it is working properly. You are scheduled for a device check from home on 07/20/12 You may send your transmission at any time that day. If you have a wireless device, the transmission will be sent automatically. After your physician reviews your transmission, you will receive a postcard with your next transmission date.

## 2012-04-26 ENCOUNTER — Encounter: Payer: Self-pay | Admitting: Internal Medicine

## 2012-04-26 NOTE — Assessment & Plan Note (Signed)
His dual-chamber, biventricular, ICD is working satisfactorily. We'll plan to recheck in several months. We discussed the possibility of revising his right ventricular lead. 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 At this point I would hope not to do any additional surgery until his device has reached elective replacement.

## 2012-04-26 NOTE — Progress Notes (Signed)
HPI [name] returns today for followup. He is a very pleasant 52 year old man with multiple medical problems including end-stage renal disease on hemodialysis, ventricular tachycardia,chronic systolic heart failure, a nonischemic cardiomyopathy, complete heart block, status post biventricular ICD implantation. The patient recently underwent cholecystectomy. He denies any recent ICD shocks. Because of dysfunction in the right ventricular pacing of his defibrillator lead, he is now pacing only in the left ventricle. His battery longevity improved. His heart failure symptoms did not worsen when we discontinued his right ventricular pacing. Allergies  Allergen Reactions  . Avelox (Moxifloxacin Hcl In Nacl)     Ask patient to clarify type of medication. Also need to know type/severity/reaction.  . Ceftriaxone Sodium Nausea And Vomiting  . Dilaudid (Hydromorphone Hcl)     sensitivity  . Protamine     Significant hypotension?  . Rocephin (Ceftriaxone Sodium In Dextrose)     Ask patient to clarify type of medication. Also need to know type/severity/reaction.  . Ciprofloxacin Rash  . Moxifloxacin Rash     Current Outpatient Prescriptions  Medication Sig Dispense Refill  . acetaminophen (TYLENOL) 325 MG tablet Take 650 mg by mouth every 6 (six) hours as needed. For pain/fever       . albuterol (PROVENTIL HFA;VENTOLIN HFA) 108 (90 BASE) MCG/ACT inhaler Inhale 2 puffs into the lungs every 4 (four) hours as needed. For shortness of breath       . amiodarone (PACERONE) 200 MG tablet Take 1 tablet (200 mg total) by mouth daily.  30 tablet  6  . azaTHIOprine (IMURAN) 50 MG tablet Take 75 mg by mouth daily.       . bimatoprost (LUMIGAN) 0.03 % ophthalmic solution Apply 1 drop to eye at bedtime.        . Calcium Polycarbophil (EQUALACTIN PO) Take 2 tablets by mouth 2 (two) times daily.       . carvedilol (COREG) 3.125 MG tablet Take 3.125 mg by mouth 4 (four) times a week. On dialysis days and saturday       . cetirizine (ZYRTEC) 10 MG tablet Take 10 mg by mouth daily.        [name] docusate sodium (COLACE) 100 MG capsule Take 100 mg by mouth as needed. constipation      . guaiFENesin (MUCINEX) 600 MG 12 hr tablet Take 600 mg by mouth 2 (two) times daily as needed. cough      . lanthanum (FOSRENOL) 1000 MG chewable tablet Chew 1,000 mg by mouth 3 (three) times daily with meals. And with snacks      . levothyroxine (SYNTHROID, LEVOTHROID) 75 MCG tablet Take 225 mcg by mouth daily.       [name] loperamide (IMODIUM) 2 MG capsule Take 1 mg by mouth 2 (two) times daily. For loose stool      . Lutein 20 MG TABS Take 1 tablet by mouth daily.        . midodrine (PROAMATINE) 5 MG tablet Take 5 mg by mouth See admin instructions. Take one tablet before dialysis and if blood pressure is less than 100      . multivitamin (RENA-VIT) TABS tablet Take 1 tablet by mouth daily.      . nitroGLYCERIN (NITROSTAT) 0.4 MG SL tablet Place 1 tablet (0.4 mg total) under the tongue every 5 (five) minutes as needed for chest pain.  30 tablet  1  . nizatidine (AXID) 150 MG capsule Take 150 mg by mouth at bedtime.        [name]  Omega-3 Fatty Acids (SALMON OIL-1000) 200 MG CAPS Take 2,000 mg by mouth daily.       [name] oxyCODONE (OXY IR/ROXICODONE) 5 MG immediate release tablet Take 1-2 tablets (5-10 mg total) by mouth every 4 (four) hours as needed for pain.  40 tablet  0  . phenytoin (DILANTIN) 100 MG ER capsule Take 100 mg by mouth 3 (three) times daily.       . predniSONE (DELTASONE) 5 MG tablet Take 5 mg by mouth daily.       . rosuvastatin (CRESTOR) 5 MG tablet Take 5 mg by mouth every other day.       . tacrolimus (PROGRAF) 1 MG capsule Take 2 mg by mouth 2 (two) times daily.        . vitamin C (ASCORBIC ACID) 500 MG tablet Take 500 mg by mouth 2 (two) times daily.        No current facility-administered medications for this visit.     Past Medical History  Diagnosis Date  . Secondary hyperparathyroidism, renal     In setting of  ESRD.  [name] Hypothyroidism   . Automatic implantable cardiac defibrillator in situ     CRT-ICD   . Hypertension   . Nonischemic cardiomyopathy     2D-echocardiogram (08/2007) - LV EF 45%, akinesis of inferoseptal wall, inferior wall,   . ESRD on hemodialysis Started age 22yo    on MWF schedule. Previous failed renal transplant 1998 at [facility] (with rejection in 1998). 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 Resumed HD 10/2006.   [name] DM type 1 (diabetes mellitus, type 1) DX: age 37yo    previously well controlled with Aic 5 (12/2008)  . Intracranial hemorrhage     History of. On prophylactic dilantin.  . 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  . Hyperlipidemia   . Hemopericardium     History of in setting of failed radiogrequency ablation for Vtach  . Anemia of chronic disease     BL Hgb 11-13. In setting of ESRD.   [name] Peptic ulcer disease     Unknown history, no records per [employer].   . Cholelithiasis     Noted at least since 01/2006. Asymptomatic.  . Erectile dysfunction   . Peripheral autonomic neuropathy due to diabetes mellitus   . GERD (gastroesophageal reflux disease)   . Glaucoma(365)   . Squamous cell skin cancer, finger     History of. Marginal resection in 05/2008 with recurrent infection/ lytic lesion involving distal phalanx  . Toe amputation status     Ischemic fourth and fifith toe left (03/2009), history of previous 1,2,3 toe  amputations.   [name] MRSA (methicillin resistant staph aureus) culture positive     Verify type - Per medical history form dated 02/13/10.  [name] Poor circulation     per medical history form dated 02/13/10.  . Ventricular tachycardia     appropriate ATP and Shocks 12/12  . RIATA LEAD     EXTERNALIZATION 12/12 (sk) // high RV threshold  . S/P BKA (below knee amputation)     right  . Arthritis   . Blood transfusion without  reported diagnosis   . Heart murmur   . Coronary artery disease   . ICD (implantable cardiac defibrillator) in place   . Legally blind   . Arteriovenous graft for hemodialysis in place, primary 2009    left  leg, had a previous graft in right arm that occulded    ROS:   All systems reviewed and negative except as noted in the HPI.   Past Surgical History  Procedure Laterality Date  . Cardiac defibrillator placement  11/05    St Jude  . Transplant pancreatic allograft  2002  . Toe amputation  03/2009    left 4th, 5th toes. Previous 1,2, 3 toe left.  . Kidney transplant  2002  . Above knee leg amputation  11/09/10    Right AKA  . Ablation      2006  . Cholecystectomy N/A 04/16/2012    Procedure: LAPAROSCOPIC CHOLECYSTECTOMY WITH INTRAOPERATIVE CHOLANGIOGRAM;  Surgeon: Ardeth Sportsman, MD;  Location: [facility] OR;  Service: General;  Laterality: N/A;     Family History  Problem Relation Age of Onset  . Hypertension Brother   . Hypertension Sister   . Coronary artery disease Mother     x 5 vessel graft in her 58s  . Hypertension Mother   . Prostate cancer Father   . Hypertension Father   . Cancer Father     Prostate  . Hypertension Brother      History   Social History  . Marital Status: Married    Spouse Name: N/A    Number of Children: 0  . Years of Education: 12th grade   Occupational History  . UNEMPLOYED    Social History Main Topics  . Smoking status: Never Smoker   . Smokeless tobacco: Never Used  . 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 Drug Use: No  . Sexually Active: Not on file   Other Topics Concern  . Not on file   Social History Narrative   The patient lives in [place] with his wife and stepdaughter.  He denies any tobacco use.  Does have a history of  previous heavy alcohol use, but has been clean since 1987 and denies any illicit drug use.   He is a retired [employer].      BP 108/55  Pulse 75  Ht  5' 10.5" (1.791 m)  Wt 135 lb (61.236 kg)  BMI 19.09 kg/m2  Physical Exam:  Chronically ill appearing, middle-age man, looking older than his stated age, NAD HEENT: Unremarkable Neck:  7 cm JVD, no thyromegally Lungs:  Clear with no wheezes, rales, or rhonchi. HEART:  Regular rate rhythm, no murmurs, no rubs, no clicks Abd:  soft, positive bowel sounds, no organomegally, no rebound, no guarding Ext:  2 plus pulses, no edema, no cyanosis, no clubbing, left above-the-knee amputation Skin:  No rashes no nodules Neuro:  CN II through XII intact, motor grossly intact  DEVICE  Normal device function.  See PaceArt for details.   Assess/Plan:

## 2012-04-26 NOTE — Assessment & Plan Note (Signed)
His chronic systolic heart failure symptoms are currently class IIb. He will continue his current medical therapy, maintain a low-sodium diet.

## 2012-05-12 ENCOUNTER — Encounter (INDEPENDENT_AMBULATORY_CARE_PROVIDER_SITE_OTHER): Payer: Self-pay | Admitting: Surgery

## 2012-05-12 ENCOUNTER — Ambulatory Visit (INDEPENDENT_AMBULATORY_CARE_PROVIDER_SITE_OTHER): Payer: Medicare Other | Admitting: Surgery

## 2012-05-12 VITALS — BP 110/62 | HR 68 | Temp 97.4°F | Resp 20 | Ht 70.5 in | Wt 130.0 lb

## 2012-05-12 DIAGNOSIS — K219 Gastro-esophageal reflux disease without esophagitis: Secondary | ICD-10-CM | POA: Insufficient documentation

## 2012-05-12 DIAGNOSIS — K529 Noninfective gastroenteritis and colitis, unspecified: Secondary | ICD-10-CM

## 2012-05-12 DIAGNOSIS — K8010 Calculus of gallbladder with chronic cholecystitis without obstruction: Secondary | ICD-10-CM

## 2012-05-12 DIAGNOSIS — R197 Diarrhea, unspecified: Secondary | ICD-10-CM

## 2012-05-12 NOTE — Patient Instructions (Signed)

## 2012-05-12 NOTE — Progress Notes (Signed)
Subjective:     Patient ID: [name], male   DOB: 12/03/1960, 52 y.o.   MRN: [id number]  HPI  [name]  06/17/1960 [id number]  Patient Care Team: Lorin Picket. Martha Clan as PCP - General (Family Medicine) Garnetta Buddy, MD (Nephrology) Marinus Maw, MD as Consulting Physician (Cardiology) Webb Silversmith as Consulting Physician (Gastroenterology) Zada Girt, MD as Consulting Physician (Nephrology)  This patient is a 52 y.o.male who presents today for surgical evaluation   POST-OPERATIVE DIAGNOSIS: Severe chronic cholecystitis with gallstones   PROCEDURE:  LAPAROSCOPIC LYSIS OF ADHESIONS  LAPAROSCOPIC CHOLECYSTECTOMY WITH INTRAOPERATIVE CHOLANGIOGRAM   Diagnosis Gallbladder - CHRONIC CHOLECYSTITIS SEE COMMENT. - CHOLELITHIASIS. - NEGATIVE FOR DYSPLASIA OR MALIGNANCY. - INCIDENTAL BENIGN LIVER. Microscopic Comment The entire gallbladder was submitted for review. On review, slide sections demonstrate chronic active cholecystitis with dense fibrous adhesions and adherent benign liver. The epithelial lining of the gallbladder is largely denuded. There are no atypical or malignant epithelial findings identified. There are foci of nodular collections of lymphocytes, histiocytes, and foreign body giant cells associated with microscopic fragments of gallstone. Transmural hemorrhage associated with acute and chronic inflammation and hemosiderin deposition is identified throughout the gallbladder. Finally, there are large vessel dystrophic calcifications present. (CRR:eps 04/20/12)  The patient comes in today with his wife.  He is feeling better.  No more nausea or vomiting.  Some occasional burping but not severe.  Reflux stable.  Some loose stools still but that is stable.  Certainly not worse.  Energy level is better.  He is in good spirits.  [facility] won so he is feeling good.  Patient Active Problem List  Diagnosis  . HYPOTHYROIDISM  . HYPERTENSION, UNSPECIFIED  . Chronic systolic  heart failure  . End stage renal disease  . TACHYCARDIA  . CHEST PAIN-PAINFUL RESPIRATIONS  . HYPERPARATHYROIDISM, HX OF  . ICD-CRT  St Judes  . Diabetes mellitus  . Wears glasses/contacts  . Glaucoma/retinopathy  . MRSA (methicillin resistant staph aureus) culture positive  . Kidney disease/stone  . Difficulty urinating  . Atherosclerosis of native arteries of the extremities with ulceration  . Ventricular arrhythmia  . Nonischemic cardiomyopathy  . RIATA LEAD  . Circulation problem  . S/P Right AKA (above knee amputation)  . H/O pancreas/kidney transplant 2002 [facility]  . Intraluminal mass of gallbladder, probable cancer  . Chronic diarrhea    Past Medical History  Diagnosis Date  . Secondary hyperparathyroidism, renal     In setting of ESRD.  [name] Hypothyroidism   . Automatic implantable cardiac defibrillator in situ     CRT-ICD   . Hypertension   . Nonischemic cardiomyopathy     2D-echocardiogram (08/2007) - LV EF 45%, akinesis of inferoseptal wall, inferior wall,   . ESRD on hemodialysis Started age 73yo    on MWF schedule. Previous failed renal transplant 1998 at [facility] (with rejection in 1998). 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 Resumed HD 10/2006.   [name] DM type 1 (diabetes mellitus, type 1) DX: age 31yo    previously well controlled with Aic 5 (12/2008)  . Intracranial hemorrhage     History of. On prophylactic dilantin.  . 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  . Hyperlipidemia   . Hemopericardium     History of in setting of failed radiogrequency ablation for Vtach  . Anemia of chronic disease  BL Hgb 11-13. In setting of ESRD.   [name] Peptic ulcer disease     Unknown history, no records per [employer].   . Cholelithiasis     Noted at least since 01/2006. Asymptomatic.  . Erectile dysfunction   . Peripheral autonomic neuropathy due  to diabetes mellitus   . GERD (gastroesophageal reflux disease)   . Glaucoma(365)   . Squamous cell skin cancer, finger     History of. Marginal resection in 05/2008 with recurrent infection/ lytic lesion involving distal phalanx  . Toe amputation status     Ischemic fourth and fifith toe left (03/2009), history of previous 1,2,3 toe  amputations.   [name] MRSA (methicillin resistant staph aureus) culture positive     Verify type - Per medical history form dated 02/13/10.  [name] Poor circulation     per medical history form dated 02/13/10.  . Ventricular tachycardia     appropriate ATP and Shocks 12/12  . RIATA LEAD     EXTERNALIZATION 12/12 (sk) // high RV threshold  . S/P BKA (below knee amputation)     right  . Arthritis   . Blood transfusion without reported diagnosis   . Heart murmur   . Coronary artery disease   . ICD (implantable cardiac defibrillator) in place   . Legally blind   . Arteriovenous graft for hemodialysis in place, primary 2009    left leg, had a previous graft in right arm that occulded    Past Surgical History  Procedure Laterality Date  . Cardiac defibrillator placement  11/05    St Jude  . Transplant pancreatic allograft  2002  . Toe amputation  03/2009    left 4th, 5th toes. Previous 1,2, 3 toe left.  . Kidney transplant  2002  . Above knee leg amputation  11/09/10    Right AKA  . Ablation      2006  . Cholecystectomy N/A 04/16/2012    Procedure: LAPAROSCOPIC CHOLECYSTECTOMY WITH INTRAOPERATIVE CHOLANGIOGRAM;  Surgeon: Ardeth Sportsman, MD;  Location: [facility] OR;  Service: General;  Laterality: N/A;    History   Social History  . Marital Status: Married    Spouse Name: N/A    Number of Children: 0  . Years of Education: 12th grade   Occupational History  . UNEMPLOYED    Social History Main Topics  . Smoking status: Never Smoker   . Smokeless tobacco: Never Used  . 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 Drug Use: No   . Sexually Active: Not on file   Other Topics Concern  . Not on file   Social History Narrative   The patient lives in [place] with his wife and stepdaughter.  He denies any tobacco use.  Does have a history of  previous heavy alcohol use, but has been clean since 1987 and denies any illicit drug use.   He is a retired [employer].     Family History  Problem Relation Age of Onset  . Hypertension Brother   . Hypertension Sister   . Coronary artery disease Mother     x 5 vessel graft in her 44s  . Hypertension Mother   . Prostate cancer Father   . Hypertension Father   . Cancer Father     Prostate  . Hypertension Brother     Current Outpatient Prescriptions  Medication Sig Dispense Refill  . acetaminophen (TYLENOL) 325 MG tablet Take 650 mg by mouth every  6 (six) hours as needed. For pain/fever       . albuterol (PROVENTIL HFA;VENTOLIN HFA) 108 (90 BASE) MCG/ACT inhaler Inhale 2 puffs into the lungs every 4 (four) hours as needed. For shortness of breath       . amiodarone (PACERONE) 200 MG tablet Take 1 tablet (200 mg total) by mouth daily.  30 tablet  6  . azaTHIOprine (IMURAN) 50 MG tablet Take 75 mg by mouth daily.       . bimatoprost (LUMIGAN) 0.03 % ophthalmic solution Apply 1 drop to eye at bedtime.        . Calcium Polycarbophil (EQUALACTIN PO) Take 2 tablets by mouth 2 (two) times daily.       . cetirizine (ZYRTEC) 10 MG tablet Take 10 mg by mouth daily.        [name] docusate sodium (COLACE) 100 MG capsule Take 100 mg by mouth as needed. constipation      . guaiFENesin (MUCINEX) 600 MG 12 hr tablet Take 600 mg by mouth 2 (two) times daily as needed. cough      . lanthanum (FOSRENOL) 1000 MG chewable tablet Chew 1,000 mg by mouth 3 (three) times daily with meals. And with snacks      . levothyroxine (SYNTHROID, LEVOTHROID) 75 MCG tablet Take 225 mcg by mouth daily.       [name] loperamide (IMODIUM) 2 MG capsule Take 1 mg by mouth 2 (two) times daily. For loose stool       . Lutein 20 MG TABS Take 1 tablet by mouth daily.        . midodrine (PROAMATINE) 5 MG tablet Take 5 mg by mouth See admin instructions. Take one tablet before dialysis and if blood pressure is less than 100      . multivitamin (RENA-VIT) TABS tablet Take 1 tablet by mouth daily.      . nizatidine (AXID) 150 MG capsule Take 150 mg by mouth at bedtime.        . Omega-3 Fatty Acids (SALMON OIL-1000) 200 MG CAPS Take 2,000 mg by mouth daily.       . phenytoin (DILANTIN) 100 MG ER capsule Take 100 mg by mouth 3 (three) times daily.       . predniSONE (DELTASONE) 5 MG tablet Take 5 mg by mouth daily.       . rosuvastatin (CRESTOR) 5 MG tablet Take 5 mg by mouth every other day.       . tacrolimus (PROGRAF) 1 MG capsule Take 2 mg by mouth 2 (two) times daily.        . vitamin C (ASCORBIC ACID) 500 MG tablet Take 500 mg by mouth 2 (two) times daily.       . carvedilol (COREG) 3.125 MG tablet Take 3.125 mg by mouth 4 (four) times a week. On dialysis days and saturday      . nitroGLYCERIN (NITROSTAT) 0.4 MG SL tablet Place 1 tablet (0.4 mg total) under the tongue every 5 (five) minutes as needed for chest pain.  30 tablet  1   No current facility-administered medications for this visit.     Allergies  Allergen Reactions  . Avelox (Moxifloxacin Hcl In Nacl)     Ask patient to clarify type of medication. Also need to know type/severity/reaction.  . Ceftriaxone Sodium Nausea And Vomiting  . Dilaudid (Hydromorphone Hcl)     sensitivity  . Protamine     Significant hypotension?  . Rocephin (Ceftriaxone Sodium In Dextrose)  Ask patient to clarify type of medication. Also need to know type/severity/reaction.  . Ciprofloxacin Rash  . Moxifloxacin Rash    BP 110/62  Pulse 68  Temp(Src) 97.4 F (36.3 C) (Oral)  Resp 20  Ht 5' 10.5" (1.791 m)  Wt 130 lb (58.968 kg)  BMI 18.38 kg/m2  Dg Cholangiogram Operative  04/16/2012  *RADIOLOGY REPORT*  Clinical Data: Intraluminal gallbladder mass.   INTRAOPERATIVE CHOLANGIOGRAM  Technique:  Multiple fluoroscopic spot radiographs were obtained during intraoperative cholangiogram and are submitted for interpretation post-operatively.  Findings:  No calculi are identified within the common hepatic or common bile ducts.  There is no evidence of common duct stricture or obstruction.  Prompt contrast emptying into the duodenum is seen.  Visualized intrahepatic bile ducts are unremarkable in appearance.  IMPRESSION: Negative intraoperative cholangiogram.  No evidence of biliary calculi or obstruction.   Original Report Authenticated By: Myles Rosenthal, M.D.      Review of Systems  Constitutional: Negative for fever, chills and diaphoresis.  HENT: Negative for sore throat, trouble swallowing and neck pain.   Eyes: Negative for photophobia and visual disturbance.  Respiratory: Negative for choking and shortness of breath.   Cardiovascular: Negative for chest pain and palpitations.  Gastrointestinal: Negative for nausea, vomiting, abdominal distention, anal bleeding and rectal pain.  Genitourinary: Negative for dysuria, urgency, difficulty urinating and testicular pain.  Musculoskeletal: Negative for myalgias, arthralgias and gait problem.  Skin: Negative for color change and rash.  Neurological: Negative for dizziness, speech difficulty, weakness and numbness.  Hematological: Negative for adenopathy.  Psychiatric/Behavioral: Negative for hallucinations, confusion and agitation.       Objective:   Physical Exam  Constitutional: He is oriented to person, place, and time. He appears well-developed and well-nourished. No distress.  HENT:  Head: Normocephalic.  Mouth/Throat: Oropharynx is clear and moist. No oropharyngeal exudate.  Eyes: Conjunctivae and EOM are normal. Pupils are equal, round, and reactive to light. No scleral icterus.  Neck: Normal range of motion. No tracheal deviation present.  Cardiovascular: Normal rate, normal heart sounds and  intact distal pulses.   Pulmonary/Chest: Effort normal. No respiratory distress.  Abdominal: Soft. He exhibits no distension. There is no tenderness. Hernia confirmed negative in the right inguinal area and confirmed negative in the left inguinal area.  Incisions clean with normal healing ridges.  No hernias  Musculoskeletal: Normal range of motion. He exhibits no tenderness.  Neurological: He is alert and oriented to person, place, and time. No cranial nerve deficit. He exhibits normal muscle tone. Coordination normal.  Skin: Skin is warm and dry. No rash noted. He is not diaphoretic.  Psychiatric: He has a normal mood and affect. His behavior is normal.       Assessment:     Recovering well from cholecystectomy for chronic cholecystitis.  Chronic diarrhea mild and stable  Chronic reflux moderate but controlled.     Plan:     I noted with his numerous health issues, does not surprise me that is at abdomen is not completely normal.  However he is in full understanding of this and definitely feels better.  He and his wife are appreciative for the care.  I think at this point his risk of postoperative complications is fading away and he can followup when necessary  Increase activity as tolerated to regular activity.  Do not push through pain.  Diet as tolerated. Bowel regimen to avoid problems.  Return to clinic p.r.n.   Instructions discussed.  Followup with primary  care physician for other health issues as would normally be done.  Questions answered.  The patient expressed understanding and appreciation  \

## 2012-07-20 ENCOUNTER — Encounter: Payer: Self-pay | Admitting: Internal Medicine

## 2012-07-20 ENCOUNTER — Ambulatory Visit (INDEPENDENT_AMBULATORY_CARE_PROVIDER_SITE_OTHER): Payer: Medicare Other | Admitting: *Deleted

## 2012-07-20 DIAGNOSIS — I5022 Chronic systolic (congestive) heart failure: Secondary | ICD-10-CM

## 2012-07-20 DIAGNOSIS — Z95810 Presence of automatic (implantable) cardiac defibrillator: Secondary | ICD-10-CM

## 2012-07-20 DIAGNOSIS — I428 Other cardiomyopathies: Secondary | ICD-10-CM

## 2012-07-20 LAB — REMOTE ICD DEVICE
AL IMPEDENCE ICD: 460 Ohm
BAMS-0003: 70 {beats}/min
HV IMPEDENCE: 40 Ohm
RV LEAD IMPEDENCE ICD: 210 Ohm
TZAT-0001SLOWVT: 1
TZAT-0004SLOWVT: 8
TZAT-0012FASTVT: 200 ms
TZAT-0012SLOWVT: 200 ms
TZAT-0013FASTVT: 1
TZAT-0019FASTVT: 7.5 V
TZAT-0019SLOWVT: 7.5 V
TZAT-0020FASTVT: 1 ms
TZON-0003FASTVT: 280 ms
TZON-0003SLOWVT: 340 ms
TZON-0004FASTVT: 12
TZON-0004SLOWVT: 20
TZON-0005FASTVT: 6
TZST-0001FASTVT: 4
TZST-0001FASTVT: 5
TZST-0001SLOWVT: 3
TZST-0001SLOWVT: 5
TZST-0003FASTVT: 40 J
TZST-0003FASTVT: 40 J
TZST-0003SLOWVT: 20 J
TZST-0003SLOWVT: 40 J

## 2012-07-23 ENCOUNTER — Encounter: Payer: Self-pay | Admitting: *Deleted

## 2012-08-28 ENCOUNTER — Telehealth: Payer: Self-pay

## 2012-08-28 ENCOUNTER — Telehealth: Payer: Self-pay | Admitting: Surgery

## 2012-08-28 NOTE — Telephone Encounter (Signed)
Pt. called to report that the suspension liner in his right leg prosthesis is very tight.  Reports that he was seen approx. 1 week ago at [facility], and was fitted for this smaller-sized liner, due to weight loss and shrinking of right AKA stump.   Questions if I can tell him if the circulation in the blood vessels, in his right upper leg, is adequate, or if the tight fitting liner will cause further problems with the circulation.  Pt. Denied swelling of right AKA stump.  Denies any sores or breakdown in the skin of right thigh, or AKA stump area.   Denies any discoloration of right AKA stump.  Does admit to some redness on top of right stump where the liner fits tight.  Advised pt. To contact [employer] regarding getting refit for proper sized liner.  Pt. States they had me in the next larger size, and it was too loose, so they put me in the smallest size they had.  Phone call to [facility]; spoke w/ Peyton Najjar.  States pt. Had a 30 cm suspension liner for the prosthesis, and it was too loose, so pt. Was fitted for the next size smaller; 28 cm.  States pt. Can come back and have the 28 cm. stretched to 29 cm.  Also discussed w/ Dr. Edilia Bo.  Advised that pt. Needs to make appt. For evaluation; no vascular study is needed, since he had a right AKA.  Phone call ret'd. to pt.  Advised to contact [employer] to have the current 28 cm suspension liner stretched, and that our office will call him to schedule an appt. With Dr. Myra Gianotti to have the right AKA stump checked.  Verb. understanding.

## 2012-08-28 NOTE — Telephone Encounter (Signed)
lvm re appt info, sent letter - kf °

## 2012-09-25 ENCOUNTER — Encounter: Payer: Self-pay | Admitting: Surgery

## 2012-09-28 ENCOUNTER — Encounter: Payer: Self-pay | Admitting: Surgery

## 2012-09-28 ENCOUNTER — Ambulatory Visit (INDEPENDENT_AMBULATORY_CARE_PROVIDER_SITE_OTHER): Payer: Medicare Other | Admitting: Surgery

## 2012-09-28 VITALS — BP 149/63 | HR 66 | Resp 16 | Ht 70.0 in | Wt 132.0 lb

## 2012-09-28 DIAGNOSIS — Z48812 Encounter for surgical aftercare following surgery on the circulatory system: Secondary | ICD-10-CM

## 2012-09-28 DIAGNOSIS — I739 Peripheral vascular disease, unspecified: Secondary | ICD-10-CM

## 2012-09-28 NOTE — Progress Notes (Signed)
[facility] [place]   Patient name: [name] MRN: [id number] DOB: 01-Jun-1960 Sex: male     Chief Complaint  Patient presents with  . PVD    Wants to discuss sore on left heel x 2 months. Also, prosthesis questions.    HISTORY OF PRESENT ILLNESS: This is a 52 year old gentleman well known to me, having undergone right lower extremity amputation. He comes in today with questions regarding his recent prosthesis. He is concerned that the boot fixed to snugly and wants to get my opinion. He does not have any open wounds on his right leg stump. He has a dialysis graft in his left groin. He has a new left heel ulcer, which he states is getting better. He thinks that happened while at dialysis.  Past Medical History  Diagnosis Date  . Secondary hyperparathyroidism, renal     In setting of ESRD.  [name] Hypothyroidism   . Automatic implantable cardiac defibrillator in situ     CRT-ICD   . Hypertension   . Nonischemic cardiomyopathy     2D-echocardiogram (08/2007) - LV EF 45%, akinesis of inferoseptal wall, inferior wall,   . ESRD on hemodialysis Started age 47yo    on MWF schedule. Previous failed renal transplant 1998 at [facility] (with rejection in 1998). 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 Resumed HD 10/2006.   [name] DM type 1 (diabetes mellitus, type 1) DX: age 58yo    previously well controlled with Aic 5 (12/2008)  . Intracranial hemorrhage     History of. On prophylactic dilantin.  . 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  . Hyperlipidemia   . Hemopericardium     History of in setting of failed radiogrequency ablation for Vtach  . Anemia of chronic disease     BL Hgb 11-13. In setting of ESRD.   [name] Peptic ulcer disease     Unknown history, no records per [employer].   . Cholelithiasis     Noted at least since 01/2006. Asymptomatic.   . Erectile dysfunction   . Peripheral autonomic neuropathy due to diabetes mellitus   . GERD (gastroesophageal reflux disease)   . Glaucoma   . Squamous cell skin cancer, finger     History of. Marginal resection in 05/2008 with recurrent infection/ lytic lesion involving distal phalanx  . Toe amputation status     Ischemic fourth and fifith toe left (03/2009), history of previous 1,2,3 toe  amputations.   [name] MRSA (methicillin resistant staph aureus) culture positive     Verify type - Per medical history form dated 02/13/10.  [name] Poor circulation     per medical history form dated 02/13/10.  . Ventricular tachycardia     appropriate ATP and Shocks 12/12  . RIATA LEAD     EXTERNALIZATION 12/12 (sk) // high RV threshold  . S/P BKA (below knee amputation)     right  . Arthritis   . Blood transfusion without reported diagnosis   . Heart murmur   . Coronary artery disease   . ICD (implantable cardiac defibrillator) in place   . Legally blind   . Arteriovenous graft for hemodialysis in place, primary 2009    left leg, had a previous graft in right arm that occulded  . Chronic cholecystitis with calculus s/p lap chole Feb 2014 03/11/2012    Past Surgical History  Procedure Laterality Date  .  Cardiac defibrillator placement  11/05    St Jude  . Transplant pancreatic allograft  2002  . Toe amputation  03/2009    left 4th, 5th toes. Previous 1,2, 3 toe left.  . Kidney transplant  2002  . Above knee leg amputation  11/09/10    Right AKA  . Ablation      2006  . Cholecystectomy N/A 04/16/2012    Procedure: LAPAROSCOPIC CHOLECYSTECTOMY WITH INTRAOPERATIVE CHOLANGIOGRAM;  Surgeon: Ardeth Sportsman, MD;  Location: [facility] OR;  Service: General;  Laterality: N/A;    History   Social History  . Marital Status: Married    Spouse Name: N/A    Number of Children: 0  . Years of Education: 12th grade   Occupational History  . UNEMPLOYED    Social History Main Topics  . Smoking status: Never  Smoker   . Smokeless tobacco: Never Used  . 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 Drug Use: No  . Sexually Active: Not on file   Other Topics Concern  . Not on file   Social History Narrative   The patient lives in [place] with his wife and stepdaughter.  He denies any tobacco use.  Does have a history of  previous heavy alcohol use, but has been clean since 1987 and denies any illicit drug use.   He is a retired [employer].     Family History  Problem Relation Age of Onset  . Hypertension Brother   . Hypertension Sister   . Coronary artery disease Mother     x 5 vessel graft in her 88s  . Hypertension Mother   . Prostate cancer Father   . Hypertension Father   . Cancer Father     Prostate  . Hypertension Brother     Allergies as of 09/28/2012 - Review Complete 09/28/2012  Allergen Reaction Noted  . Avelox (moxifloxacin hcl in nacl)  08/21/2010  . Ceftriaxone sodium Nausea And Vomiting   . Dilaudid (hydromorphone hcl)  03/03/2011  . Protamine  08/12/2010  . Rocephin (ceftriaxone sodium in dextrose)  08/21/2010  . Ciprofloxacin Rash   . Moxifloxacin Rash     Current Outpatient Prescriptions on File Prior to Visit  Medication Sig Dispense Refill  . acetaminophen (TYLENOL) 325 MG tablet Take 650 mg by mouth every 6 (six) hours as needed. For pain/fever       . albuterol (PROVENTIL HFA;VENTOLIN HFA) 108 (90 BASE) MCG/ACT inhaler Inhale 2 puffs into the lungs every 4 (four) hours as needed. For shortness of breath       . amiodarone (PACERONE) 200 MG tablet Take 1 tablet (200 mg total) by mouth daily.  30 tablet  6  . azaTHIOprine (IMURAN) 50 MG tablet Take 75 mg by mouth daily.       . bimatoprost (LUMIGAN) 0.03 % ophthalmic solution Apply 1 drop to eye at bedtime.        . Calcium Polycarbophil (EQUALACTIN PO) Take 2 tablets by mouth 2 (two) times daily.       . cetirizine (ZYRTEC) 10 MG tablet Take 10 mg by mouth daily.        [name]  docusate sodium (COLACE) 100 MG capsule Take 100 mg by mouth as needed. constipation      . guaiFENesin (MUCINEX) 600 MG 12 hr tablet Take 600 mg by mouth 2 (two) times daily as needed. cough      . lanthanum (FOSRENOL) 1000  MG chewable tablet Chew 1,000 mg by mouth 3 (three) times daily with meals. And with snacks      . levothyroxine (SYNTHROID, LEVOTHROID) 75 MCG tablet Take 225 mcg by mouth daily.       [name] loperamide (IMODIUM) 2 MG capsule Take 1 mg by mouth 2 (two) times daily. For loose stool      . Lutein 20 MG TABS Take 1 tablet by mouth daily.        . midodrine (PROAMATINE) 5 MG tablet Take 5 mg by mouth See admin instructions. Take one tablet before dialysis and if blood pressure is less than 100      . multivitamin (RENA-VIT) TABS tablet Take 1 tablet by mouth daily.      . nizatidine (AXID) 150 MG capsule Take 150 mg by mouth at bedtime.        . Omega-3 Fatty Acids (SALMON OIL-1000) 200 MG CAPS Take 2,000 mg by mouth daily.       . phenytoin (DILANTIN) 100 MG ER capsule Take 100 mg by mouth 3 (three) times daily.       . predniSONE (DELTASONE) 5 MG tablet Take 5 mg by mouth daily.       . rosuvastatin (CRESTOR) 5 MG tablet Take 5 mg by mouth every other day.       . tacrolimus (PROGRAF) 1 MG capsule Take 2 mg by mouth 2 (two) times daily.        . vitamin C (ASCORBIC ACID) 500 MG tablet Take 500 mg by mouth 2 (two) times daily.       . carvedilol (COREG) 3.125 MG tablet Take 3.125 mg by mouth 4 (four) times a week. On dialysis days and saturday      . nitroGLYCERIN (NITROSTAT) 0.4 MG SL tablet Place 1 tablet (0.4 mg total) under the tongue every 5 (five) minutes as needed for chest pain.  30 tablet  1   No current facility-administered medications on file prior to visit.     REVIEW OF SYSTEMS: Please see history of present illness, otherwise all systems negative  PHYSICAL EXAMINATION:   Vital signs are BP 149/63  Pulse 66  Resp 16  Ht 5\' 10"  (1.778 m)  Wt 132 lb (59.875 kg)   BMI 18.94 kg/m2  SpO2 99% General: The patient appears their stated age. HEENT:  No gross abnormalities Pulmonary:  Non labored breathing Abdomen: Soft and non-tender Musculoskeletal: Right above-knee the patient's stump is well-healed Neurologic: No focal weakness or paresthesias are detected, Skin: Small superficial skin tear on the left heel without evidence of infection Psychiatric: The patient has normal affect. Cardiovascular: Palpable thrill in left thigh graft   Diagnostic Studies None  Assessment: Peripheral vascular disease, status post right leg amputation Plan: The patient's right lower extremity stump looks good today. There is no evidence of skin breakdown. The patient is contemplating not utilizing a prosthesis because of his vision. He needs 24-hour assist and feels like the prosthesis is more of a hassle then a benefit. I told him that that would be his decision but that I see no evidence of complications from his new fitting. I told him to keep a nylon his left heel ulcer. This most likely is secondary to pressure while he is on dialysis. Told to make sure that there is no pressure on the heel during his dialysis or when he is in his wheelchair. The patient will follow up on a when necessary basis.  Juleen China  IV, M.D. [facility] [place] Office: [phone] Pager:  [phone]

## 2012-10-19 ENCOUNTER — Ambulatory Visit (INDEPENDENT_AMBULATORY_CARE_PROVIDER_SITE_OTHER): Payer: Federal, State, Local not specified - PPO | Admitting: *Deleted

## 2012-10-19 ENCOUNTER — Encounter: Payer: Self-pay | Admitting: Internal Medicine

## 2012-10-19 DIAGNOSIS — I428 Other cardiomyopathies: Secondary | ICD-10-CM

## 2012-10-19 DIAGNOSIS — Z95810 Presence of automatic (implantable) cardiac defibrillator: Secondary | ICD-10-CM

## 2012-10-19 DIAGNOSIS — I5022 Chronic systolic (congestive) heart failure: Secondary | ICD-10-CM

## 2012-10-19 LAB — REMOTE ICD DEVICE
AL IMPEDENCE ICD: 400 Ohm
BAMS-0001: 150 {beats}/min
BAMS-0003: 70 {beats}/min
LV LEAD IMPEDENCE ICD: 710 Ohm
RV LEAD AMPLITUDE: 12 mv
RV LEAD IMPEDENCE ICD: 230 Ohm
TZAT-0004FASTVT: 8
TZAT-0004SLOWVT: 8
TZAT-0012FASTVT: 200 ms
TZAT-0018FASTVT: NEGATIVE
TZAT-0019FASTVT: 7.5 V
TZON-0003FASTVT: 280 ms
TZON-0004FASTVT: 12
TZON-0010FASTVT: 40 ms
TZON-0010SLOWVT: 40 ms
TZST-0001FASTVT: 3
TZST-0001FASTVT: 4
TZST-0001SLOWVT: 2
TZST-0001SLOWVT: 3
TZST-0001SLOWVT: 5
TZST-0003FASTVT: 30 J
TZST-0003FASTVT: 40 J
TZST-0003SLOWVT: 20 J
TZST-0003SLOWVT: 40 J

## 2012-10-23 ENCOUNTER — Encounter: Payer: Self-pay | Admitting: *Deleted

## 2012-11-28 ENCOUNTER — Emergency Department (HOSPITAL_COMMUNITY)
Admission: EM | Admit: 2012-11-28 | Discharge: 2012-11-28 | Disposition: A | Discharge status: Home / Self Care | Payer: Medicare Other | Attending: Emergency Medicine | Admitting: Emergency Medicine

## 2012-11-28 ENCOUNTER — Emergency Department (HOSPITAL_COMMUNITY): Payer: Medicare Other

## 2012-11-28 ENCOUNTER — Encounter (HOSPITAL_COMMUNITY): Payer: Self-pay | Admitting: *Deleted

## 2012-11-28 DIAGNOSIS — Z8711 Personal history of peptic ulcer disease: Secondary | ICD-10-CM | POA: Insufficient documentation

## 2012-11-28 DIAGNOSIS — Z8614 Personal history of Methicillin resistant Staphylococcus aureus infection: Secondary | ICD-10-CM | POA: Insufficient documentation

## 2012-11-28 DIAGNOSIS — N186 End stage renal disease: Secondary | ICD-10-CM

## 2012-11-28 DIAGNOSIS — I120 Hypertensive chronic kidney disease with stage 5 chronic kidney disease or end stage renal disease: Secondary | ICD-10-CM | POA: Insufficient documentation

## 2012-11-28 DIAGNOSIS — J40 Bronchitis, not specified as acute or chronic: Secondary | ICD-10-CM | POA: Insufficient documentation

## 2012-11-28 DIAGNOSIS — Z95828 Presence of other vascular implants and grafts: Secondary | ICD-10-CM | POA: Insufficient documentation

## 2012-11-28 DIAGNOSIS — Z95810 Presence of automatic (implantable) cardiac defibrillator: Secondary | ICD-10-CM | POA: Insufficient documentation

## 2012-11-28 DIAGNOSIS — E785 Hyperlipidemia, unspecified: Secondary | ICD-10-CM | POA: Insufficient documentation

## 2012-11-28 DIAGNOSIS — G909 Disorder of the autonomic nervous system, unspecified: Secondary | ICD-10-CM | POA: Insufficient documentation

## 2012-11-28 DIAGNOSIS — Z862 Personal history of diseases of the blood and blood-forming organs and certain disorders involving the immune mechanism: Secondary | ICD-10-CM | POA: Insufficient documentation

## 2012-11-28 DIAGNOSIS — E039 Hypothyroidism, unspecified: Secondary | ICD-10-CM | POA: Insufficient documentation

## 2012-11-28 DIAGNOSIS — Z8669 Personal history of other diseases of the nervous system and sense organs: Secondary | ICD-10-CM | POA: Insufficient documentation

## 2012-11-28 DIAGNOSIS — Z85828 Personal history of other malignant neoplasm of skin: Secondary | ICD-10-CM | POA: Insufficient documentation

## 2012-11-28 DIAGNOSIS — E1049 Type 1 diabetes mellitus with other diabetic neurological complication: Secondary | ICD-10-CM | POA: Insufficient documentation

## 2012-11-28 DIAGNOSIS — I2510 Atherosclerotic heart disease of native coronary artery without angina pectoris: Secondary | ICD-10-CM | POA: Insufficient documentation

## 2012-11-28 DIAGNOSIS — IMO0002 Reserved for concepts with insufficient information to code with codable children: Secondary | ICD-10-CM | POA: Insufficient documentation

## 2012-11-28 DIAGNOSIS — Z79899 Other long term (current) drug therapy: Secondary | ICD-10-CM | POA: Insufficient documentation

## 2012-11-28 DIAGNOSIS — Z8719 Personal history of other diseases of the digestive system: Secondary | ICD-10-CM | POA: Insufficient documentation

## 2012-11-28 DIAGNOSIS — S88119A Complete traumatic amputation at level between knee and ankle, unspecified lower leg, initial encounter: Secondary | ICD-10-CM | POA: Insufficient documentation

## 2012-11-28 DIAGNOSIS — Z87448 Personal history of other diseases of urinary system: Secondary | ICD-10-CM | POA: Insufficient documentation

## 2012-11-28 DIAGNOSIS — M129 Arthropathy, unspecified: Secondary | ICD-10-CM | POA: Insufficient documentation

## 2012-11-28 DIAGNOSIS — S98139A Complete traumatic amputation of one unspecified lesser toe, initial encounter: Secondary | ICD-10-CM | POA: Insufficient documentation

## 2012-11-28 DIAGNOSIS — R011 Cardiac murmur, unspecified: Secondary | ICD-10-CM | POA: Insufficient documentation

## 2012-11-28 DIAGNOSIS — H548 Legal blindness, as defined in USA: Secondary | ICD-10-CM | POA: Insufficient documentation

## 2012-11-28 DIAGNOSIS — Z992 Dependence on renal dialysis: Secondary | ICD-10-CM | POA: Insufficient documentation

## 2012-11-28 LAB — CBC WITH DIFFERENTIAL/PLATELET
Basophils Absolute: 0 10*3/uL (ref 0.0–0.1)
Eosinophils Absolute: 0.2 10*3/uL (ref 0.0–0.7)
Eosinophils Relative: 2 % (ref 0–5)
HCT: 35 % — ABNORMAL LOW (ref 39.0–52.0)
Lymphocytes Relative: 10 % — ABNORMAL LOW (ref 12–46)
MCH: 34.8 pg — ABNORMAL HIGH (ref 26.0–34.0)
MCV: 101.4 fL — ABNORMAL HIGH (ref 78.0–100.0)
Monocytes Absolute: 1.1 10*3/uL — ABNORMAL HIGH (ref 0.1–1.0)
RDW: 15.1 % (ref 11.5–15.5)
WBC: 9.7 10*3/uL (ref 4.0–10.5)

## 2012-11-28 LAB — BASIC METABOLIC PANEL
CO2: 34 mEq/L — ABNORMAL HIGH (ref 19–32)
Calcium: 9.1 mg/dL (ref 8.4–10.5)
Creatinine, Ser: 3.33 mg/dL — ABNORMAL HIGH (ref 0.50–1.35)
GFR calc non Af Amer: 19 mL/min — ABNORMAL LOW (ref >90)
Glucose, Bld: 89 mg/dL (ref 70–99)

## 2012-11-28 MED ORDER — HYDROCODONE-HOMATROPINE 5-1.5 MG/5ML PO SYRP: 2.5000 mL | ORAL_SOLUTION | Freq: Four times a day (QID) | ORAL | Status: DC | PRN

## 2012-11-28 MED ORDER — ALBUTEROL SULFATE HFA 108 (90 BASE) MCG/ACT IN AERS
2.0000 | INHALATION_SPRAY | Freq: Once | RESPIRATORY_TRACT | Status: AC
  Administered 2012-11-28: 2 via RESPIRATORY_TRACT
  Filled 2012-11-28: qty 6.7

## 2012-11-28 NOTE — ED Provider Notes (Signed)
  Face-to-face evaluation   History: He has been ill for several days with cough. He started a Z-Pak 2 days ago. He is having cough, productive of yellow sputum, and generalized malaise, and weakness.  Physical exam: Alert, cooperative. Lungs decreased air movement bilaterally without wheezes, rales, or rhonchi. Heart regular rate and rhythm, with 3/6 systolic murmur at the base  Medical screening examination/treatment/procedure(s) were conducted as a shared visit with non-physician practitioner(s) and myself.  I personally evaluated the patient during the encounter  Flint Melter, MD 11/28/12 1704

## 2012-11-28 NOTE — ED Notes (Signed)
Pt reports having productive cough since wed, has been started on antibiotics but now having low grade fever and not feeling well. Pt is mwf dialysis, missed it yesterday but had full treatment this am. No resp distress noted at triage. spo2 98% at triage.

## 2012-11-28 NOTE — ED Provider Notes (Signed)
CSN: [id number]     Arrival date & time 11/28/12  1351 History   First MD Initiated Contact with Patient 11/28/12 1439     Chief Complaint  Patient presents with  . Cough   (Consider location/radiation/quality/duration/timing/severity/associated sxs/prior Treatment) HPI Comments: Patient presents with a cough x 6 days.  Was put on z-pak by Dr Hyman Hopes (nephrology) 3 days ago.  Cough is productive of yellow sputum. Denies chest pain or shortness of breath.  Pt had full dialysis session this morning and staff noted he had an elevated temperature (99) and sent him to ED for evaluation.    The history is provided by the patient and the spouse.    Past Medical History  Diagnosis Date  . Secondary hyperparathyroidism, renal     In setting of ESRD.  [name] Hypothyroidism   . Automatic implantable cardiac defibrillator in situ     CRT-ICD   . Hypertension   . Nonischemic cardiomyopathy     2D-echocardiogram (08/2007) - LV EF 45%, akinesis of inferoseptal wall, inferior wall,   . ESRD on hemodialysis Started age 6yo    on MWF schedule. Previous failed renal transplant 1998 at [facility] (with rejection in 1998). 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 Resumed HD 10/2006.   [name] DM type 1 (diabetes mellitus, type 1) DX: age 30yo    previously well controlled with Aic 5 (12/2008)  . Intracranial hemorrhage     History of. On prophylactic dilantin.  . 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  . Hyperlipidemia   . Hemopericardium     History of in setting of failed radiogrequency ablation for Vtach  . Anemia of chronic disease     BL Hgb 11-13. In setting of ESRD.   [name] Peptic ulcer disease     Unknown history, no records per [employer].   . Cholelithiasis     Noted at least since 01/2006. Asymptomatic.  . Erectile dysfunction   . Peripheral autonomic neuropathy due to diabetes  mellitus   . GERD (gastroesophageal reflux disease)   . Glaucoma   . Squamous cell skin cancer, finger     History of. Marginal resection in 05/2008 with recurrent infection/ lytic lesion involving distal phalanx  . Toe amputation status     Ischemic fourth and fifith toe left (03/2009), history of previous 1,2,3 toe  amputations.   [name] MRSA (methicillin resistant staph aureus) culture positive     Verify type - Per medical history form dated 02/13/10.  [name] Poor circulation     per medical history form dated 02/13/10.  . Ventricular tachycardia     appropriate ATP and Shocks 12/12  . RIATA LEAD     EXTERNALIZATION 12/12 (sk) // high RV threshold  . S/P BKA (below knee amputation)     right  . Arthritis   . Blood transfusion without reported diagnosis   . Heart murmur   . Coronary artery disease   . ICD (implantable cardiac defibrillator) in place   . Legally blind   . Arteriovenous graft for hemodialysis in place, primary 2009    left leg, had a previous graft in right arm that occulded  . Chronic cholecystitis with calculus s/p lap chole Feb 2014 03/11/2012   Past Surgical History  Procedure Laterality Date  . Cardiac defibrillator placement  11/05    St Jude  . Transplant pancreatic allograft  2002  .  Toe amputation  03/2009    left 4th, 5th toes. Previous 1,2, 3 toe left.  . Kidney transplant  2002  . Above knee leg amputation  11/09/10    Right AKA  . Ablation      2006  . Cholecystectomy N/A 04/16/2012    Procedure: LAPAROSCOPIC CHOLECYSTECTOMY WITH INTRAOPERATIVE CHOLANGIOGRAM;  Surgeon: Ardeth Sportsman, MD;  Location: [facility] OR;  Service: General;  Laterality: N/A;   Family History  Problem Relation Age of Onset  . Hypertension Brother   . Hypertension Sister   . Coronary artery disease Mother     x 5 vessel graft in her 66s  . Hypertension Mother   . Prostate cancer Father   . Hypertension Father   . Cancer Father     Prostate  . Hypertension Brother    History   Substance Use Topics  . Smoking status: Never Smoker   . Smokeless tobacco: Never Used  . 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  HENT: Negative for congestion, sore throat and rhinorrhea.   Respiratory: Positive for cough. Negative for chest tightness and shortness of breath.   Cardiovascular: Negative for chest pain and leg swelling.  Gastrointestinal: Negative for abdominal pain.  Musculoskeletal: Negative for myalgias.    Allergies  Avelox; Ceftriaxone sodium; Dilaudid; Protamine; Rocephin; Ciprofloxacin; and Moxifloxacin  Home Medications   Current Outpatient Rx  Name  Route  Sig  Dispense  Refill  . acetaminophen (TYLENOL) 325 MG tablet   Oral   Take 650 mg by mouth every 6 (six) hours as needed. For pain/fever          . azaTHIOprine (IMURAN) 50 MG tablet   Oral   Take 75 mg by mouth daily.          . bimatoprost (LUMIGAN) 0.03 % ophthalmic solution   Ophthalmic   Apply 1 drop to eye at bedtime.           . Calcium Polycarbophil (EQUALACTIN PO)   Oral   Take 2 tablets by mouth 2 (two) times daily.          . cetirizine (ZYRTEC) 10 MG tablet   Oral   Take 10 mg by mouth daily.           [name] gabapentin (NEURONTIN) 100 MG capsule   Oral   Take 100 mg by mouth 2 (two) times daily.         [name] guaiFENesin (MUCINEX) 600 MG 12 hr tablet   Oral   Take 600 mg by mouth 2 (two) times daily as needed. cough         . lanthanum (FOSRENOL) 1000 MG chewable tablet   Oral   Chew 1,000 mg by mouth 3 (three) times daily with meals. And with snacks         . levothyroxine (SYNTHROID, LEVOTHROID) 75 MCG tablet   Oral   Take 225 mcg by mouth daily.          [name] loperamide (IMODIUM) 2 MG capsule   Oral   Take 1 mg by mouth 2 (two) times daily. For loose stool         . Lutein 20 MG TABS   Oral   Take 1 tablet by mouth daily.           . midodrine  (PROAMATINE) 5 MG tablet   Oral  Take 5 mg by mouth See admin instructions. Take one tablet before dialysis and if blood pressure is less than 100         . multivitamin (RENA-VIT) TABS tablet   Oral   Take 1 tablet by mouth daily.         . nizatidine (AXID) 150 MG capsule   Oral   Take 150 mg by mouth at bedtime.           . Omega-3 Fatty Acids (SALMON OIL-1000) 200 MG CAPS   Oral   Take 2,000 mg by mouth daily.          . phenytoin (DILANTIN) 100 MG ER capsule   Oral   Take 100 mg by mouth 3 (three) times daily.          . predniSONE (DELTASONE) 5 MG tablet   Oral   Take 5 mg by mouth daily.          . rosuvastatin (CRESTOR) 5 MG tablet   Oral   Take 5 mg by mouth every other day.          . tacrolimus (PROGRAF) 1 MG capsule   Oral   Take 2 mg by mouth 2 (two) times daily.           . vitamin C (ASCORBIC ACID) 500 MG tablet   Oral   Take 500 mg by mouth 2 (two) times daily.           BP 121/58  Pulse 86  Temp(Src) 98.7 F (37.1 C) (Oral)  Resp 24  SpO2 91% Physical Exam  Nursing note and vitals reviewed. Constitutional: He appears well-developed and well-nourished. No distress.  HENT:  Head: Normocephalic and atraumatic.  Neck: Neck supple.  Cardiovascular: Normal rate, regular rhythm, normal heart sounds and intact distal pulses.   Pulmonary/Chest: Effort normal and breath sounds normal. No respiratory distress. He has no decreased breath sounds. He has no wheezes. He has no rhonchi.  Bibasilar rales + cough  Abdominal: Soft. He exhibits no distension. There is no tenderness. There is no rebound and no guarding.  Neurological: He is alert.  Skin: He is not diaphoretic.    ED Course  Procedures (including critical care time) Labs Review Labs Reviewed  CBC WITH DIFFERENTIAL - Abnormal; Notable for the following:    RBC 3.45 (*)    Hemoglobin 12.0 (*)    HCT 35.0 (*)    MCV 101.4 (*)    MCH 34.8 (*)    Lymphocytes Relative 10 (*)     Monocytes Absolute 1.1 (*)    All other components within normal limits  BASIC METABOLIC PANEL - Abnormal; Notable for the following:    Potassium 3.1 (*)    Chloride 95 (*)    CO2 34 (*)    Creatinine, Ser 3.33 (*)    GFR calc non Af Amer 19 (*)    GFR calc Af Amer 23 (*)    All other components within normal limits   Imaging Review Dg Chest 2 View  11/28/2012   CLINICAL DATA:  Cough. Fever. Chest congestion. Chronic renal failure.  EXAM: CHEST  2 VIEW  COMPARISON:  03/20/2012  FINDINGS: Mild cardiomegaly is stable. AICD remains in stable position. Both lungs are clear. No evidence of pleural effusion. Multiple old left rib fracture deformities are again noted. Prior median sternotomy again demonstrated.  IMPRESSION: Stable mild cardiomegaly. No active lung disease.   Electronically Signed   By: Jonny Ruiz  Eppie Gibson   On: 11/28/2012 16:03    3:17 PM Discussed pt with Dr Effie Shy who will also see the patient.   3:51 PM Discussed pt with Dr Bettina Gavia who will see patient tomorrow.  MDM   1. Bronchitis   2. End stage renal disease    Patient with 6 days of productive cough, with decreased O2 on room air while in ED, on z-pak, CXR negative.  Pt with bronchitis vs early pneumonia.  Pt offered admission but he declined.  Dr Effie Shy and I both recommended admission to patient.  He denied any SOB and his O2 sat improved to 95% when I last spoke with him.  Pt d/c home with strict return precautions.  Discussed this with Dr Effie Shy and discussed treatment - as Dr Hyman Hopes put patient on z-pak, will not change or add to antibiotic choice.  Have advised close follow up with Dr Hyman Hopes and/or Dr Martha Clan (PCP) for recheck.  D/C home with cough medication.  Discussed all results with patient.  Pt given return precautions.  Pt verbalizes understanding and agrees with plan.      Trixie Dredge, PA-C 11/28/12 1654

## 2012-11-28 NOTE — ED Provider Notes (Deleted)
Medical screening examination/treatment/procedure(s) were performed by non-physician practitioner and as supervising physician I was immediately available for consultation/collaboration.  Flint Melter, MD 11/28/12 [phone]

## 2012-11-28 NOTE — ED Notes (Signed)
Pt transported to Xray. 

## 2013-01-25 ENCOUNTER — Ambulatory Visit (INDEPENDENT_AMBULATORY_CARE_PROVIDER_SITE_OTHER): Payer: Federal, State, Local not specified - PPO | Admitting: *Deleted

## 2013-01-25 ENCOUNTER — Encounter: Payer: Self-pay | Admitting: Internal Medicine

## 2013-01-25 DIAGNOSIS — I428 Other cardiomyopathies: Secondary | ICD-10-CM

## 2013-01-25 DIAGNOSIS — I5022 Chronic systolic (congestive) heart failure: Secondary | ICD-10-CM

## 2013-01-25 LAB — MDC_IDC_ENUM_SESS_TYPE_REMOTE
Battery Remaining Percentage: 28 %
Battery Voltage: 2.84 V
Brady Statistic AP VP Percent: 27 %
Brady Statistic AS VP Percent: 68 %
Implantable Pulse Generator Serial Number: 614310
Lead Channel Impedance Value: 210 Ohm
Lead Channel Pacing Threshold Amplitude: 1 V
Lead Channel Pacing Threshold Pulse Width: 0.5 ms
Lead Channel Sensing Intrinsic Amplitude: 12 mV
Lead Channel Setting Pacing Amplitude: 2 V
Lead Channel Setting Pacing Pulse Width: 0.5 ms
Lead Channel Setting Sensing Sensitivity: 0.5 mV
Zone Setting Detection Interval: 280 ms
Zone Setting Detection Interval: 340 ms

## 2013-02-02 ENCOUNTER — Encounter: Payer: Self-pay | Admitting: *Deleted

## 2013-04-16 IMAGING — CR DG TIBIA/FIBULA 2V*R*
2 series · 2 of 2 positions shown · non-contrast
Comparison: None.

CLINICAL DATA: Pain following trauma, previous below-the-knee
amputation.

RIGHT TIBIA AND FIBULA - 2 VIEW

[x tib-fib ap right]
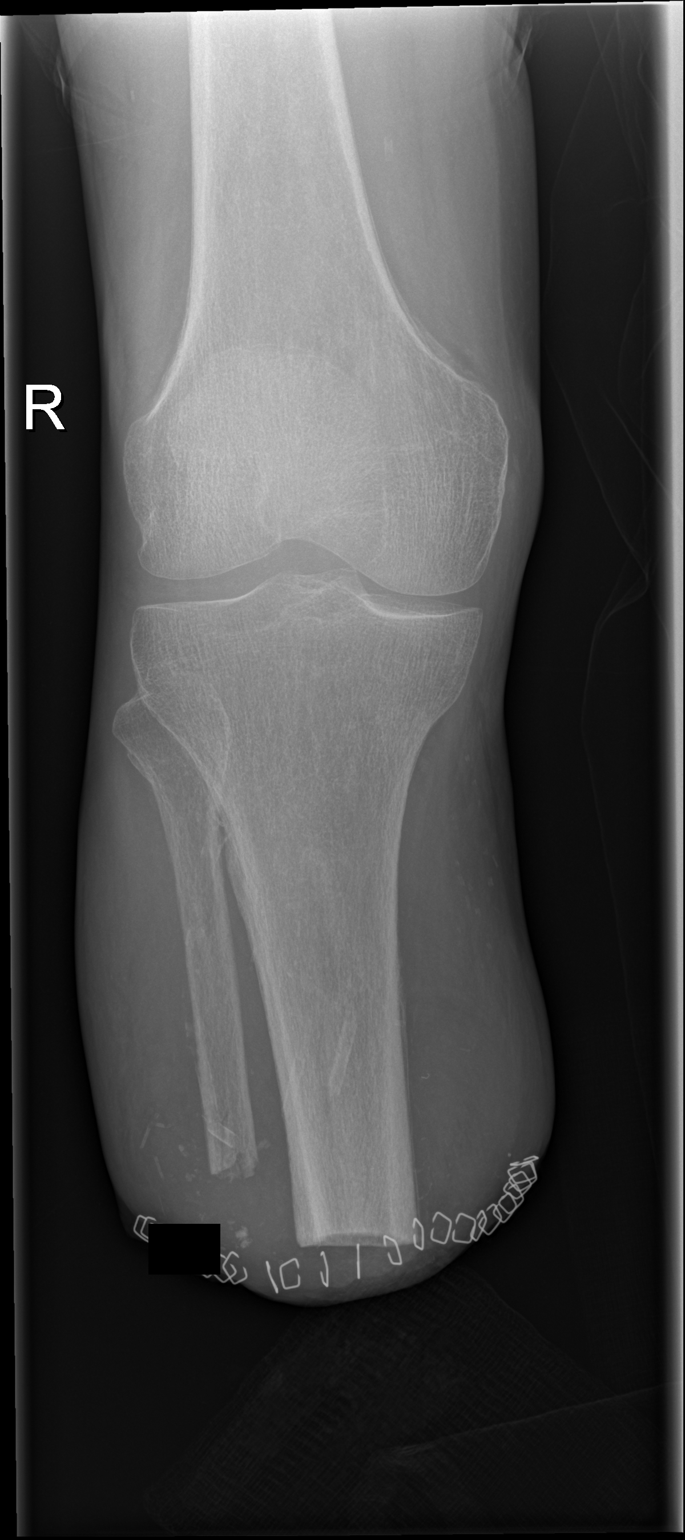

[x tib-fib lat right]
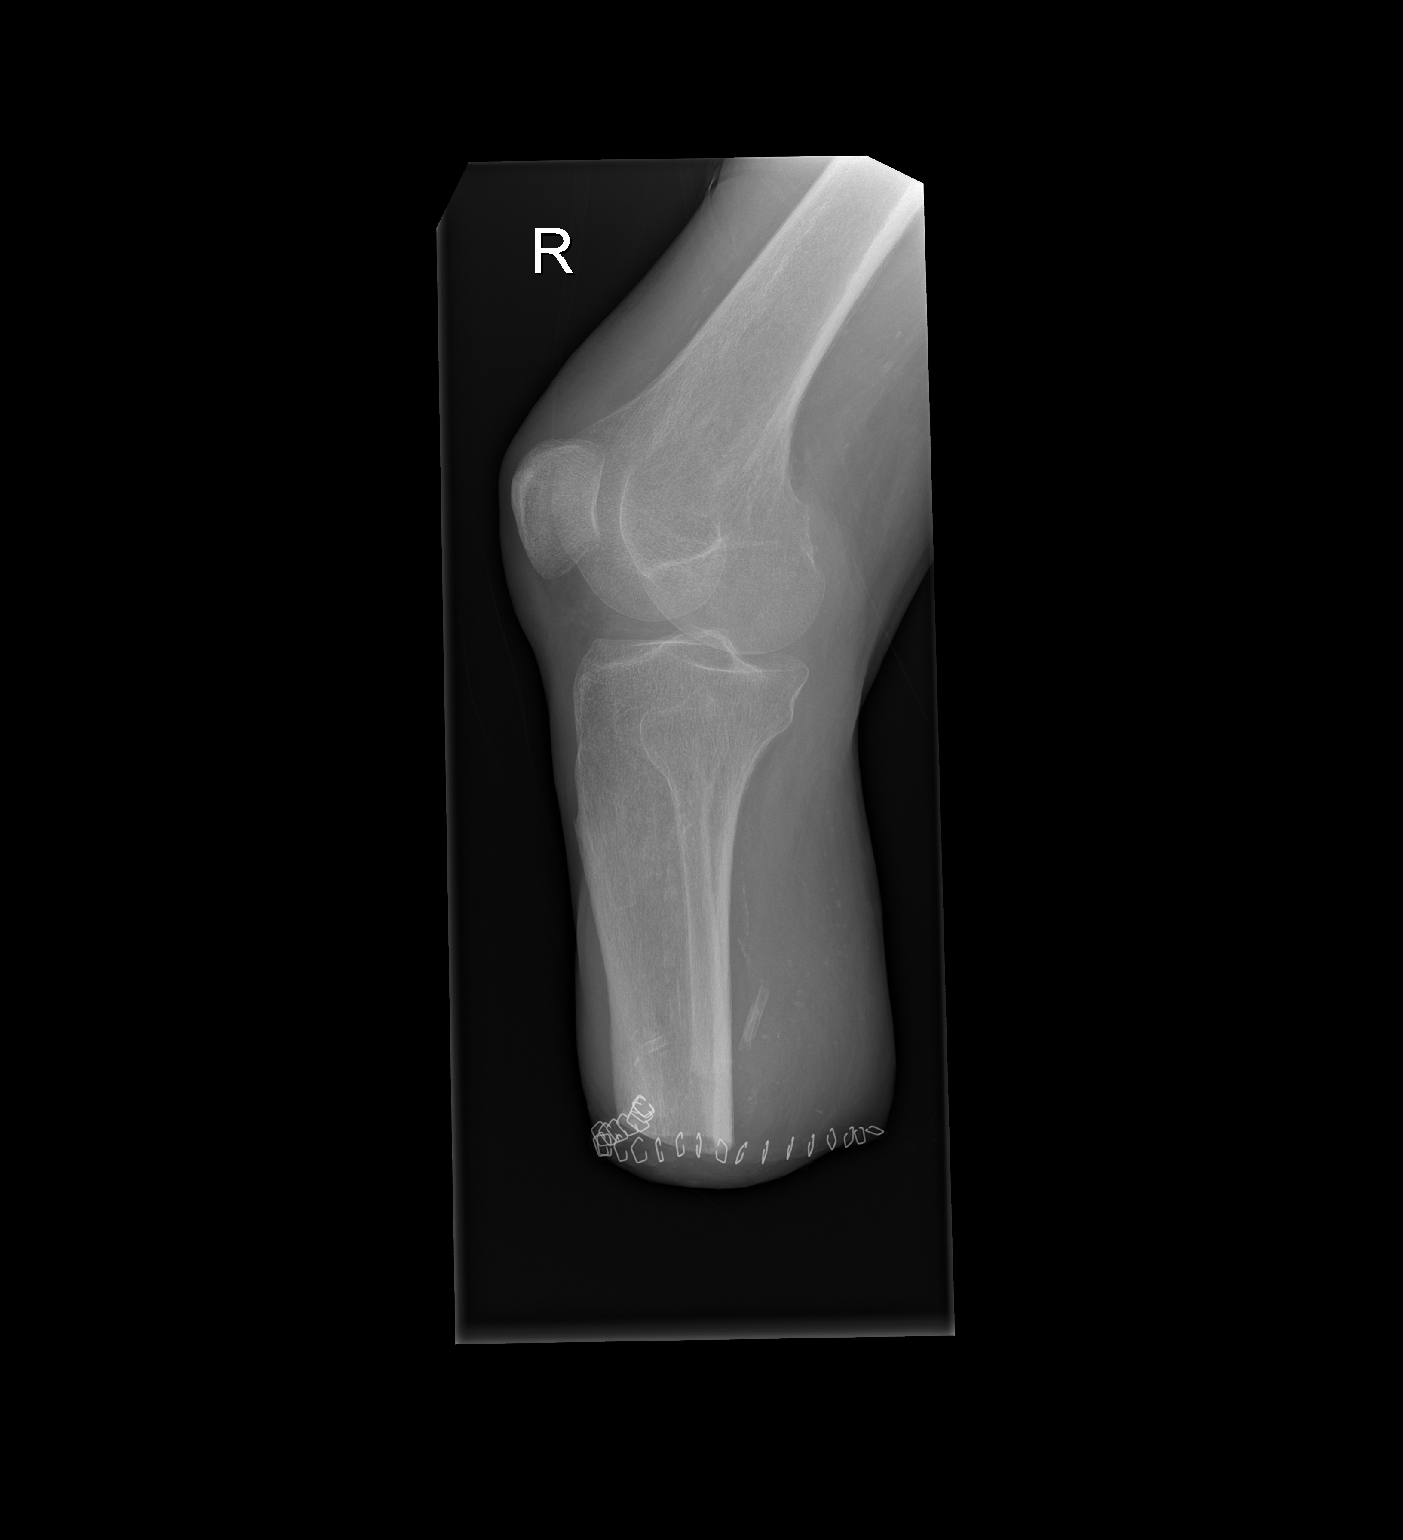

[2 of 2 positions shown; findings below may reference images not displayed]

FINDINGS: Surgical changes in keeping with recent below-the-knee
amputation.  Vascular calcifications noted.  There are several
small osseous fragments at the distal aspect of the fibula, which
are nonspecific and may be postsurgical rather than related to
recent trauma.  The distal tibia has a smooth contour.
IMPRESSION: Postsurgical changes of below-the-knee amputation.  No definite
acute abnormality.

## 2013-04-16 IMAGING — CT CT HEAD W/O CM
1 of 2 series · 15 of 30 positions shown, 19 images · non-contrast
Comparison: None.

CLINICAL DATA: Fall, trauma to the occipital area.

CT HEAD WITHOUT CONTRAST
TECHNIQUE: Contiguous axial images were obtained from the base of
the skull through the vertex without contrast.

[Series 3: recon 2: brain · axial · 0.47mm/px · z∈[+33,+180]mm · 15 of 64 slices shown, 19 images]
[im 4/64  brain]
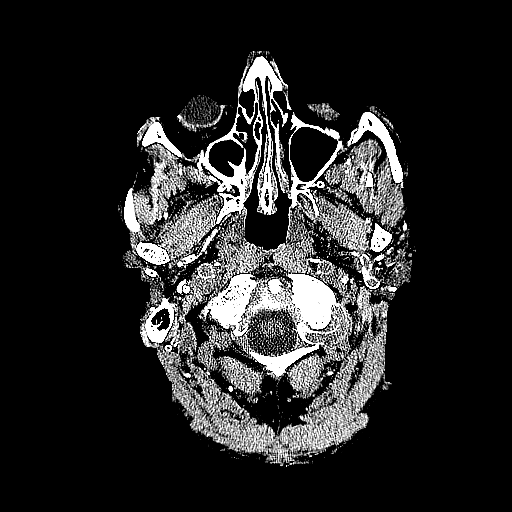
[im 4/64  bone]
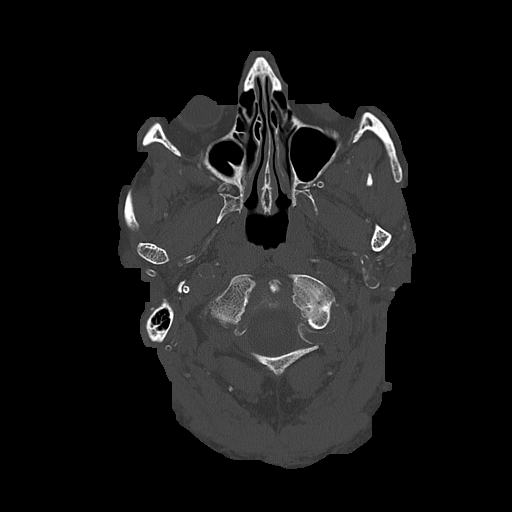
[im 7/64  brain]
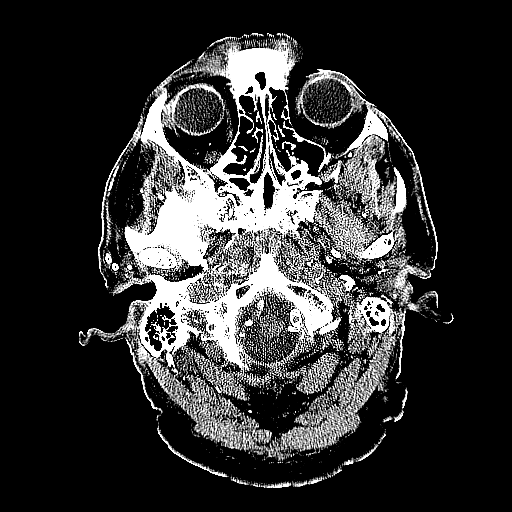
[im 14/64  brain]
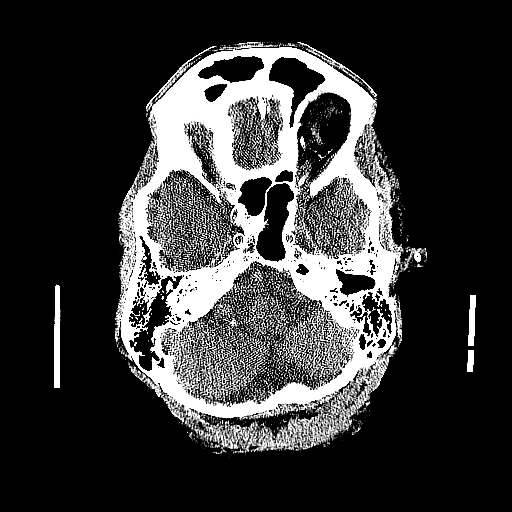
[im 17/64  brain]
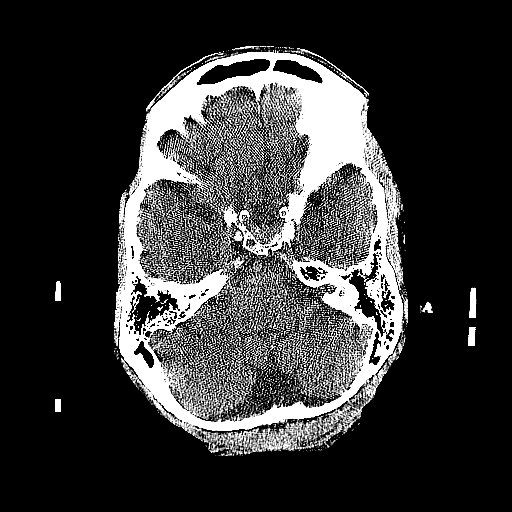
[im 20/64  brain]
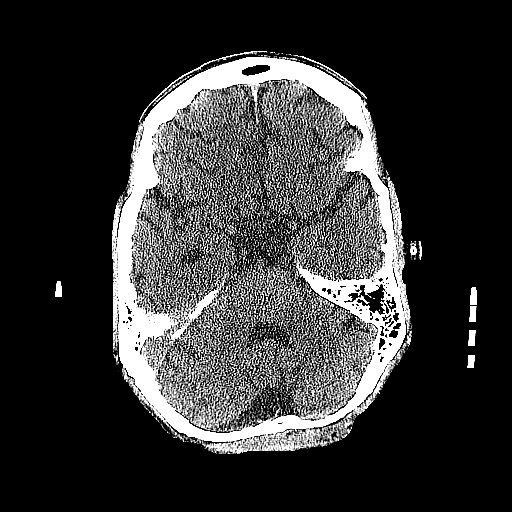
[im 20/64  bone]
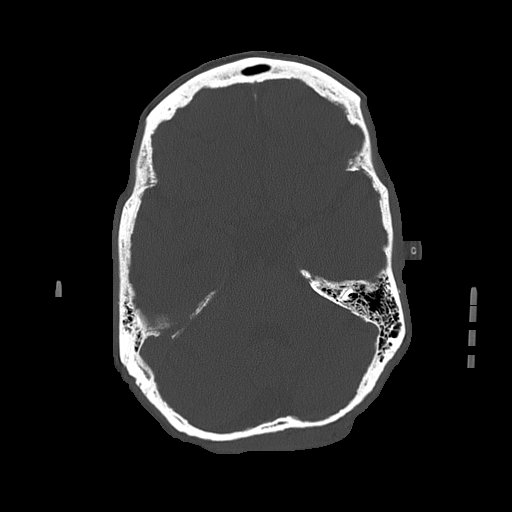
[im 24/64  brain]
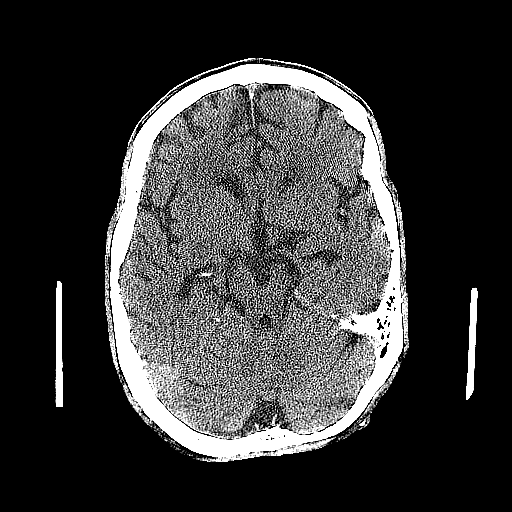
[im 27/64  brain]
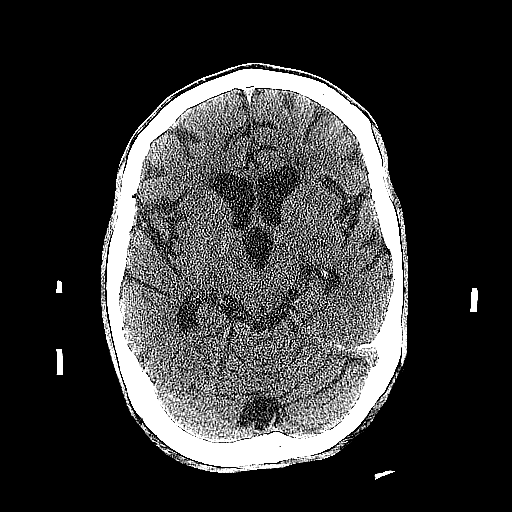
[im 34/64  brain]
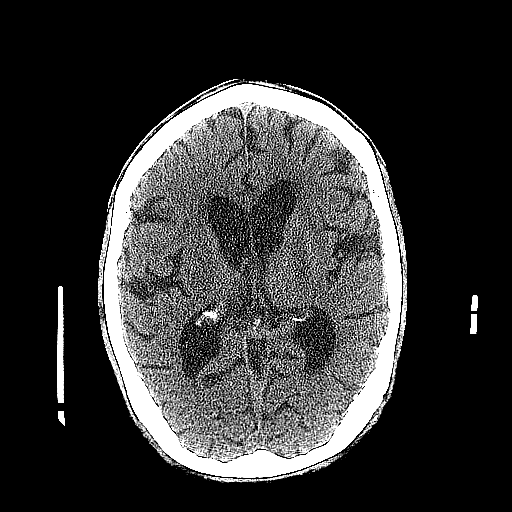
[im 37/64  brain]
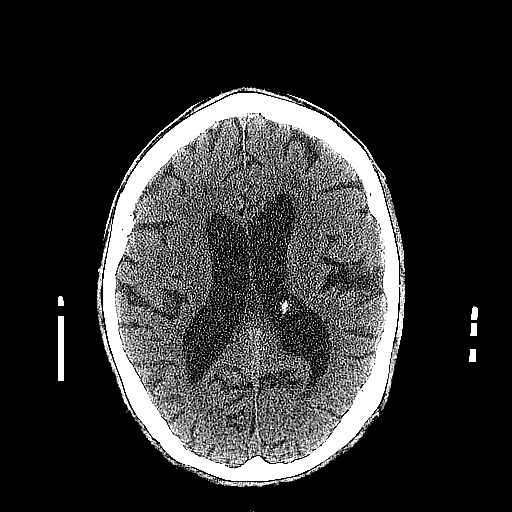
[im 37/64  bone]
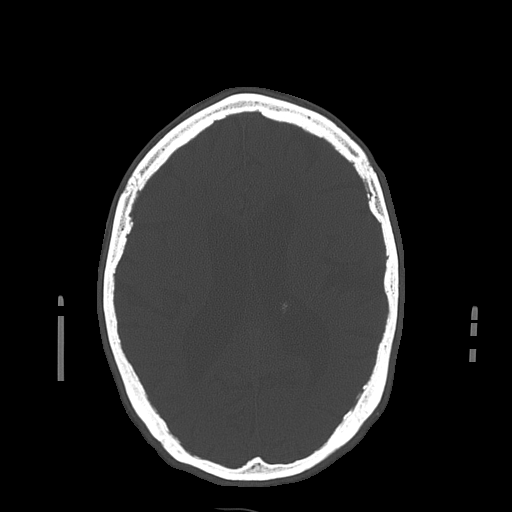
[im 40/64  brain]
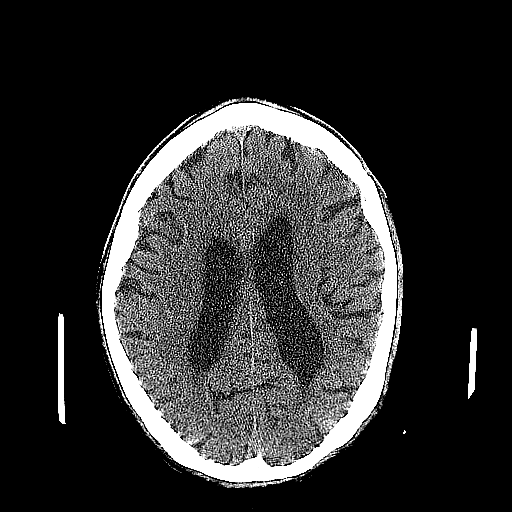
[im 44/64  brain]
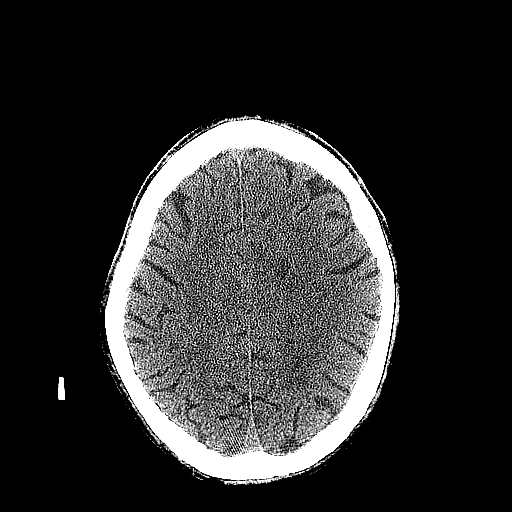
[im 47/64  brain]
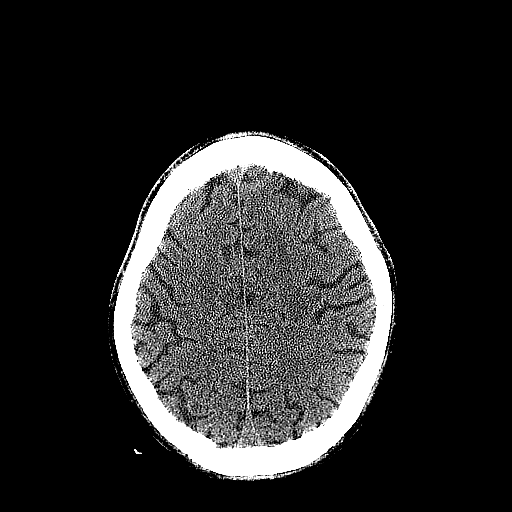
[im 54/64  brain]
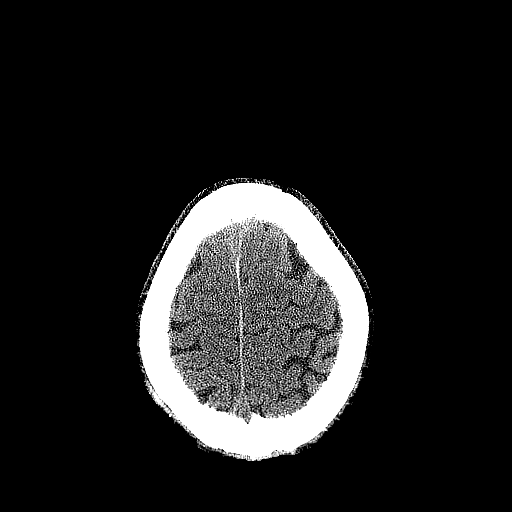
[im 54/64  bone]
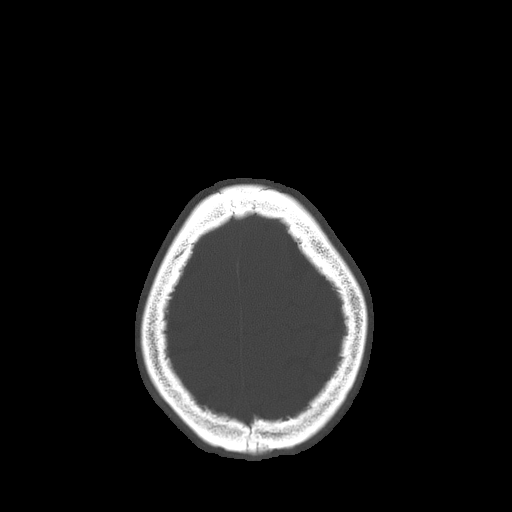
[im 57/64  brain]
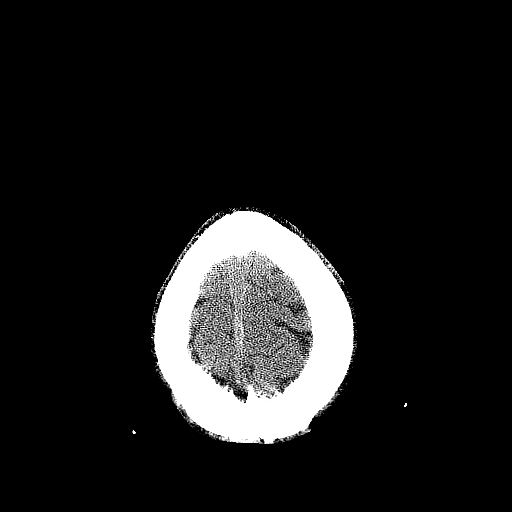
[im 60/64  brain]
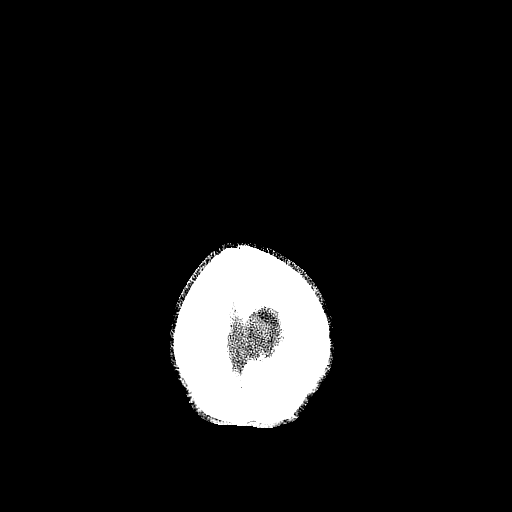

[15 of 30 positions shown; findings below may reference images not displayed]

FINDINGS: Prominence of the sulci, cisterns, and ventricles, in
keeping with volume loss. There are subcortical and periventricular
white matter hypodensities, a nonspecific finding most often seen
with chronic microangiopathic changes.  Dural based calcifications
bilaterally are nonspecific.

There is no evidence for acute hemorrhage, overt hydrocephalus,
mass lesion, or abnormal extra-axial fluid collection.  No definite
CT evidence for acute cortical based (large artery) infarction.
Advanced vascular calcification. The visualized paranasal sinuses
and mastoid air cells are predominately clear. Surgical changes to
the bilateral globes.
IMPRESSION: Chronic changes as described above.  No definite acute intracranial
abnormality.

## 2013-04-27 ENCOUNTER — Encounter: Payer: Self-pay | Admitting: Internal Medicine

## 2013-04-27 ENCOUNTER — Ambulatory Visit (INDEPENDENT_AMBULATORY_CARE_PROVIDER_SITE_OTHER): Payer: Medicare Other | Admitting: Internal Medicine

## 2013-04-27 VITALS — BP 109/60 | HR 63

## 2013-04-27 DIAGNOSIS — Z95810 Presence of automatic (implantable) cardiac defibrillator: Secondary | ICD-10-CM

## 2013-04-27 DIAGNOSIS — I5022 Chronic systolic (congestive) heart failure: Secondary | ICD-10-CM

## 2013-04-27 DIAGNOSIS — I428 Other cardiomyopathies: Secondary | ICD-10-CM

## 2013-04-27 DIAGNOSIS — I499 Cardiac arrhythmia, unspecified: Secondary | ICD-10-CM

## 2013-04-27 LAB — MDC_IDC_ENUM_SESS_TYPE_INCLINIC
Brady Statistic RV Percent Paced: 95 %
HighPow Impedance: 42 Ohm
Implantable Pulse Generator Serial Number: 614310
Lead Channel Impedance Value: 212.5 Ohm
Lead Channel Impedance Value: 762.5 Ohm
Lead Channel Pacing Threshold Amplitude: 1 V
Lead Channel Pacing Threshold Pulse Width: 0.5 ms
Lead Channel Sensing Intrinsic Amplitude: 2.1 mV
Lead Channel Sensing Intrinsic Amplitude: 2.8 mV
Lead Channel Setting Pacing Pulse Width: 0.05 ms
MDC IDC MSMT BATTERY REMAINING LONGEVITY: 27.6 mo
MDC IDC MSMT LEADCHNL LV PACING THRESHOLD AMPLITUDE: 1 V
MDC IDC MSMT LEADCHNL LV PACING THRESHOLD PULSEWIDTH: 0.5 ms
MDC IDC MSMT LEADCHNL LV PACING THRESHOLD PULSEWIDTH: 0.5 ms
MDC IDC MSMT LEADCHNL RA IMPEDANCE VALUE: 437.5 Ohm
MDC IDC MSMT LEADCHNL RA PACING THRESHOLD AMPLITUDE: 0.75 V
MDC IDC MSMT LEADCHNL RA PACING THRESHOLD AMPLITUDE: 0.75 V
MDC IDC MSMT LEADCHNL RA PACING THRESHOLD PULSEWIDTH: 0.5 ms
MDC IDC SESS DTM: 20150224102559
MDC IDC SET LEADCHNL LV PACING AMPLITUDE: 2 V
MDC IDC SET LEADCHNL LV PACING PULSEWIDTH: 0.5 ms
MDC IDC SET LEADCHNL RA PACING AMPLITUDE: 2 V
MDC IDC SET LEADCHNL RV PACING AMPLITUDE: 0.25 V
MDC IDC SET LEADCHNL RV SENSING SENSITIVITY: 0.5 mV
MDC IDC SET ZONE DETECTION INTERVAL: 250 ms
MDC IDC SET ZONE DETECTION INTERVAL: 340 ms
MDC IDC STAT BRADY RA PERCENT PACED: 24 %
Zone Setting Detection Interval: 280 ms

## 2013-04-27 NOTE — Assessment & Plan Note (Signed)
His symptoms remain class 2. He is limited by his left AKA. He will continue his current meds, hemodialysis, and a low sodium diet.

## 2013-04-27 NOTE — Assessment & Plan Note (Signed)
His St. Jude BiV ICD is working normally. Will recheck in several months. He appears to be tolerating LV only pacing very nicely and he still has 2 years of longevity.

## 2013-04-27 NOTE — Progress Notes (Signed)
HPI Mr. Minehart returns today for followup. He is a very pleasant 53 year old man with multiple medical problems including end-stage renal disease on hemodialysis, ventricular tachycardia,chronic systolic heart failure, a nonischemic cardiomyopathy, complete heart block, status post biventricular ICD implantation. He denies any recent ICD shocks. Because of dysfunction in the right ventricular pacing of his defibrillator lead, he is now pacing only in the left ventricle. His battery longevity is improved. His heart failure symptoms did not worsen when we discontinued his right ventricular pacing. Allergies  Allergen Reactions  . Avelox [Moxifloxacin Hcl In Nacl]     Ask patient to clarify type of medication. Also need to know type/severity/reaction.  . Ceftriaxone Sodium Nausea And Vomiting  . Dilaudid [Hydromorphone Hcl]     sensitivity  . Protamine     Significant hypotension?  . Rocephin [Ceftriaxone Sodium In Dextrose]     Ask patient to clarify type of medication. Also need to know type/severity/reaction.  . Ciprofloxacin Rash  . Moxifloxacin Rash     Current Outpatient Prescriptions  Medication Sig Dispense Refill  . acetaminophen (TYLENOL) 325 MG tablet Take 650 mg by mouth every 6 (six) hours as needed. For pain/fever       . amiodarone (PACERONE) 200 MG tablet Take 200 mg by mouth daily.      [name] azaTHIOprine (IMURAN) 50 MG tablet Take 75 mg by mouth daily.       . bimatoprost (LUMIGAN) 0.03 % ophthalmic solution Apply 1 drop to eye at bedtime.        . Calcium Polycarbophil (EQUALACTIN PO) Take 2 tablets by mouth 2 (two) times daily.       . carvedilol (COREG) 3.125 MG tablet Take 3.125 mg by mouth See admin instructions. Takes after dialysis on Monday, Wednesday, and Friday      . cetirizine (ZYRTEC) 10 MG tablet Take 10 mg by mouth daily.        [name] gabapentin (NEURONTIN) 100 MG capsule Take 200 mg by mouth at bedtime.       [name] guaiFENesin (MUCINEX) 600 MG 12 hr tablet Take 600 mg  by mouth 2 (two) times daily as needed. cough      . lanthanum (FOSRENOL) 1000 MG chewable tablet Chew 1,000 mg by mouth 3 (three) times daily with meals. And with snacks      . levothyroxine (SYNTHROID, LEVOTHROID) 75 MCG tablet Take 225 mcg by mouth daily.       [name] loperamide (IMODIUM) 2 MG capsule Take 1 mg by mouth 2 (two) times daily. For loose stool      . Lutein 20 MG TABS Take 1 tablet by mouth daily.       . multivitamin (RENA-VIT) TABS tablet Take 1 tablet by mouth daily.      . nitroGLYCERIN (NITROSTAT) 0.4 MG SL tablet Place 0.4 mg under the tongue every 5 (five) minutes as needed for chest pain.      . nizatidine (AXID) 150 MG capsule Take 150 mg by mouth at bedtime.        . Omega-3 Fatty Acids (SALMON OIL-1000) 200 MG CAPS Take 2,000 mg by mouth daily.       . phenytoin (DILANTIN) 100 MG ER capsule Take 100 mg by mouth 3 (three) times daily.       . predniSONE (DELTASONE) 5 MG tablet Take 5 mg by mouth daily.       . rosuvastatin (CRESTOR) 5 MG tablet Take 5 mg by mouth every other day.       [name]  tacrolimus (PROGRAF) 1 MG capsule Take 2 mg by mouth 2 (two) times daily.        . vitamin C (ASCORBIC ACID) 500 MG tablet Take 500 mg by mouth 2 (two) times daily.        No current facility-administered medications for this visit.     Past Medical History  Diagnosis Date  . Secondary hyperparathyroidism, renal     In setting of ESRD.  [name] Hypothyroidism   . Automatic implantable cardiac defibrillator in situ     CRT-ICD   . Hypertension   . Nonischemic cardiomyopathy     2D-echocardiogram (08/2007) - LV EF 45%, akinesis of inferoseptal wall, inferior wall,   . ESRD on hemodialysis Started age 36yo    on MWF schedule. Previous failed renal transplant 1998 at [facility] (with rejection in 1998). 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 Resumed HD 10/2006.   [name] DM type 1 (diabetes mellitus, type 1) DX: age 49yo    previously well  controlled with Aic 5 (12/2008)  . Intracranial hemorrhage     History of. On prophylactic dilantin.  . 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  . Hyperlipidemia   . Hemopericardium     History of in setting of failed radiogrequency ablation for Vtach  . Anemia of chronic disease     BL Hgb 11-13. In setting of ESRD.   [name] Peptic ulcer disease     Unknown history, no records per [facility].   . Cholelithiasis     Noted at least since 01/2006. Asymptomatic.  . Erectile dysfunction   . Peripheral autonomic neuropathy due to diabetes mellitus   . GERD (gastroesophageal reflux disease)   . Glaucoma   . Squamous cell skin cancer, finger     History of. Marginal resection in 05/2008 with recurrent infection/ lytic lesion involving distal phalanx  . Toe amputation status     Ischemic fourth and fifith toe left (03/2009), history of previous 1,2,3 toe  amputations.   [name] MRSA (methicillin resistant staph aureus) culture positive     Verify type - Per medical history form dated 02/13/10.  [name] Poor circulation     per medical history form dated 02/13/10.  . Ventricular tachycardia     appropriate ATP and Shocks 12/12  . RIATA LEAD     EXTERNALIZATION 12/12 (sk) // high RV threshold  . S/P BKA (below knee amputation)     right  . Arthritis   . Blood transfusion without reported diagnosis   . Heart murmur   . Coronary artery disease   . ICD (implantable cardiac defibrillator) in place   . Legally blind   . Arteriovenous graft for hemodialysis in place, primary 2009    left leg, had a previous graft in right arm that occulded  . Chronic cholecystitis with calculus s/p lap chole Feb 2014 03/11/2012    ROS:   All systems reviewed and negative except as noted in the HPI.   Past Surgical History  Procedure Laterality Date  . Cardiac defibrillator placement  11/05    St Jude  . Transplant pancreatic allograft  2002   . Toe amputation  03/2009    left 4th, 5th toes. Previous 1,2, 3 toe left.  . Kidney transplant  2002  . Above knee leg amputation  11/09/10    Right AKA  . Ablation  2006  . Cholecystectomy N/A 04/16/2012    Procedure: LAPAROSCOPIC CHOLECYSTECTOMY WITH INTRAOPERATIVE CHOLANGIOGRAM;  Surgeon: Adin Hector, MD;  Location: [facility];  Service: General;  Laterality: N/A;     Family History  Problem Relation Age of Onset  . Hypertension Brother   . Hypertension Sister   . Coronary artery disease Mother     x 5 vessel graft in her 31s  . Hypertension Mother   . Prostate cancer Father   . Hypertension Father   . Cancer Father     Prostate  . Hypertension Brother      History   Social History  . Marital Status: Married    Spouse Name: N/A    Number of Children: 0  . Years of Education: 12th grade   Occupational History  . UNEMPLOYED    Social History Main Topics  . Smoking status: Never Smoker   . Smokeless tobacco: Never Used  . 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 Drug Use: No  . Sexual Activity: Not on file   Other Topics Concern  . Not on file   Social History Narrative   The patient lives in [place] with his wife and stepdaughter.  He denies any tobacco use.  Does have a history of  previous heavy alcohol use, but has been clean since 1987 and denies any illicit drug use.   He is a retired Conservation officer, [employer].      BP 109/60  Pulse 63  Physical Exam:  Chronically ill appearing, middle-age man, looking older than his stated age, NAD HEENT: Unremarkable Neck:  7 cm JVD, no thyromegally Lungs:  Clear with no wheezes, rales, or rhonchi. HEART:  Regular rate rhythm, no murmurs, no rubs, no clicks Abd:  soft, positive bowel sounds, no organomegally, no rebound, no guarding Ext:  2 plus pulses, no edema, no cyanosis, no clubbing, right above-the-knee amputation Skin:  No rashes no nodules Neuro:  CN II through XII  intact, motor grossly intact  DEVICE  Normal device function.  See PaceArt for details.   Assess/Plan:

## 2013-04-27 NOTE — Patient Instructions (Addendum)
The current medical regimen is effective;  continue present plan and medications.  Follow up in 6 months with Dr Lovena Le.  You will receive a letter in the mail 2 months before you are due.  Please call us when you receive this letter to schedule your follow up appointment.

## 2013-04-27 NOTE — Assessment & Plan Note (Signed)
He has had no recurrent sustained ventricular arrhythmias. Will follow and continue low dose amiodarone.

## 2013-04-28 ENCOUNTER — Telehealth: Payer: Self-pay | Admitting: Internal Medicine

## 2013-04-28 NOTE — Telephone Encounter (Signed)
New message    bp at dialysis today was 112/39.  Is the bottom number too low?  Pt sat a while and it came up to 105/55.  Pt is not dizzy.   If this bottom number is ok---you do not have to call the patient back.

## 2013-07-29 ENCOUNTER — Ambulatory Visit (INDEPENDENT_AMBULATORY_CARE_PROVIDER_SITE_OTHER): Payer: Medicare Other | Admitting: *Deleted

## 2013-07-29 ENCOUNTER — Encounter: Payer: Self-pay | Admitting: Internal Medicine

## 2013-07-29 DIAGNOSIS — I428 Other cardiomyopathies: Secondary | ICD-10-CM

## 2013-07-29 LAB — MDC_IDC_ENUM_SESS_TYPE_REMOTE
Battery Remaining Longevity: 25 mo
Battery Remaining Percentage: 25 %
Battery Voltage: 2.81 V
Brady Statistic AP VP Percent: 31 %
Brady Statistic AS VP Percent: 64 %
Date Time Interrogation Session: 20150528074958
HIGH POWER IMPEDANCE MEASURED VALUE: 41 Ohm
Lead Channel Impedance Value: 750 Ohm
Lead Channel Sensing Intrinsic Amplitude: 2.5 mV
Lead Channel Setting Pacing Amplitude: 0.25 V
Lead Channel Setting Pacing Amplitude: 2 V
Lead Channel Setting Pacing Pulse Width: 0.05 ms
MDC IDC MSMT LEADCHNL RA IMPEDANCE VALUE: 460 Ohm
MDC IDC MSMT LEADCHNL RV IMPEDANCE VALUE: 250 Ohm
MDC IDC MSMT LEADCHNL RV SENSING INTR AMPL: 4 mV
MDC IDC PG SERIAL: 614310
MDC IDC SET LEADCHNL LV PACING AMPLITUDE: 2 V
MDC IDC SET LEADCHNL LV PACING PULSEWIDTH: 0.5 ms
MDC IDC SET LEADCHNL RV SENSING SENSITIVITY: 0.5 mV
MDC IDC SET ZONE DETECTION INTERVAL: 250 ms
MDC IDC SET ZONE DETECTION INTERVAL: 280 ms
MDC IDC SET ZONE DETECTION INTERVAL: 340 ms
MDC IDC STAT BRADY AP VS PERCENT: 1 %
MDC IDC STAT BRADY AS VS PERCENT: 2.2 %
MDC IDC STAT BRADY RA PERCENT PACED: 30 %

## 2013-07-29 NOTE — Progress Notes (Signed)
Remote ICD transmission.   

## 2013-09-02 ENCOUNTER — Other Ambulatory Visit: Payer: Self-pay | Admitting: Dermatology

## 2013-09-07 ENCOUNTER — Encounter: Payer: Self-pay | Admitting: Cardiology

## 2013-10-26 ENCOUNTER — Ambulatory Visit (INDEPENDENT_AMBULATORY_CARE_PROVIDER_SITE_OTHER): Payer: Medicare Other | Admitting: Internal Medicine

## 2013-10-26 ENCOUNTER — Encounter: Payer: Self-pay | Admitting: Internal Medicine

## 2013-10-26 VITALS — BP 123/62 | HR 66 | Ht 70.0 in | Wt 127.6 lb

## 2013-10-26 DIAGNOSIS — I5022 Chronic systolic (congestive) heart failure: Secondary | ICD-10-CM

## 2013-10-26 DIAGNOSIS — I428 Other cardiomyopathies: Secondary | ICD-10-CM

## 2013-10-26 DIAGNOSIS — I499 Cardiac arrhythmia, unspecified: Secondary | ICD-10-CM

## 2013-10-26 LAB — MDC_IDC_ENUM_SESS_TYPE_INCLINIC
Date Time Interrogation Session: 20150825102446
HighPow Impedance: 39.9141
Implantable Pulse Generator Serial Number: 614310
Lead Channel Impedance Value: 425 Ohm
Lead Channel Pacing Threshold Amplitude: 1 V
Lead Channel Pacing Threshold Pulse Width: 0.5 ms
Lead Channel Sensing Intrinsic Amplitude: 2.3 mV
Lead Channel Setting Pacing Pulse Width: 0.5 ms
MDC IDC MSMT BATTERY REMAINING LONGEVITY: 22.8 mo
MDC IDC MSMT LEADCHNL LV IMPEDANCE VALUE: 712.5 Ohm
MDC IDC MSMT LEADCHNL RA PACING THRESHOLD AMPLITUDE: 1 V
MDC IDC MSMT LEADCHNL RA PACING THRESHOLD PULSEWIDTH: 0.5 ms
MDC IDC MSMT LEADCHNL RA SENSING INTR AMPL: 2.6 mV
MDC IDC MSMT LEADCHNL RV IMPEDANCE VALUE: 237.5 Ohm
MDC IDC SET LEADCHNL LV PACING AMPLITUDE: 2 V
MDC IDC SET LEADCHNL RA PACING AMPLITUDE: 2 V
MDC IDC SET LEADCHNL RV PACING AMPLITUDE: 0.25 V
MDC IDC SET LEADCHNL RV PACING PULSEWIDTH: 0.05 ms
MDC IDC SET LEADCHNL RV SENSING SENSITIVITY: 0.5 mV
MDC IDC SET ZONE DETECTION INTERVAL: 340 ms
MDC IDC STAT BRADY RA PERCENT PACED: 35 %
MDC IDC STAT BRADY RV PERCENT PACED: 95 %
Zone Setting Detection Interval: 250 ms
Zone Setting Detection Interval: 280 ms

## 2013-10-26 NOTE — Progress Notes (Signed)
HPI [name] returns today for followup. He is a very pleasant 53 year old man with multiple medical problems including end-stage renal disease on hemodialysis, ventricular tachycardia,chronic systolic heart failure, a nonischemic cardiomyopathy, complete heart block, status post biventricular ICD implantation. 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  He denies any recent ICD shocks. Because of dysfunction in the right ventricular pacing of his defibrillator lead, he is now pacing only in the left ventricle. His battery longevity is improved. His heart failure symptoms did not worsen when we discontinued his right ventricular pacing. He has an estimated 2 years on the battery. No syncope. Allergies  Allergen Reactions  . Avelox [Moxifloxacin Hcl In Nacl]     Ask patient to clarify type of medication. Also need to know type/severity/reaction.  . Ceftriaxone Sodium Nausea And Vomiting  . Dilaudid [Hydromorphone Hcl]     sensitivity  . Protamine     Significant hypotension?  . Rocephin [Ceftriaxone Sodium In Dextrose]     Ask patient to clarify type of medication. Also need to know type/severity/reaction.  . Ciprofloxacin Rash  . Moxifloxacin Rash     Current Outpatient Prescriptions  Medication Sig Dispense Refill  . acetaminophen (TYLENOL) 325 MG tablet Take 650 mg by mouth every 6 (six) hours as needed. For pain/fever       . amiodarone (PACERONE) 200 MG tablet Take 200 mg by mouth daily.      [name] azaTHIOprine (IMURAN) 50 MG tablet Take 75 mg by mouth daily.       . bimatoprost (LUMIGAN) 0.03 % ophthalmic solution Apply 1 drop to eye at bedtime.        . Calcium Polycarbophil (EQUALACTIN PO) Take 2 tablets by mouth 2 (two) times daily.       . carvedilol (COREG) 3.125 MG tablet Take 3.125 mg by mouth See admin instructions. Takes after dialysis on Monday, Wednesday, and Friday      . gabapentin (NEURONTIN) 100 MG capsule Take 200 mg by mouth at bedtime.       [name] lanthanum (FOSRENOL) 1000 MG chewable  tablet Chew 1,000 mg by mouth 3 (three) times daily with meals. And with snacks      . levothyroxine (SYNTHROID, LEVOTHROID) 75 MCG tablet Take 225 mcg by mouth daily.       . Lutein 20 MG TABS Take 1 tablet by mouth daily.       . multivitamin (RENA-VIT) TABS tablet Take 1 tablet by mouth daily.      . nitroGLYCERIN (NITROSTAT) 0.4 MG SL tablet Place 0.4 mg under the tongue every 5 (five) minutes as needed for chest pain.      . nizatidine (AXID) 150 MG capsule Take 150 mg by mouth at bedtime.        . Omega-3 Fatty Acids (SALMON OIL-1000) 200 MG CAPS Take 2,000 mg by mouth daily.       . phenytoin (DILANTIN) 100 MG ER capsule Take 100 mg by mouth 3 (three) times daily.       . predniSONE (DELTASONE) 5 MG tablet Take 5 mg by mouth daily.       . rosuvastatin (CRESTOR) 5 MG tablet Take 5 mg by mouth every other day.       . tacrolimus (PROGRAF) 1 MG capsule Take 2 mg by mouth 2 (two) times daily.        . vitamin C (ASCORBIC ACID) 500 MG tablet Take 500 mg by mouth 2 (two) times daily.  No current facility-administered medications for this visit.     Past Medical History  Diagnosis Date  . Secondary hyperparathyroidism, renal     In setting of ESRD.  [name] Hypothyroidism   . Automatic implantable cardiac defibrillator in situ     CRT-ICD   . Hypertension   . Nonischemic cardiomyopathy     2D-echocardiogram (08/2007) - LV EF 45%, akinesis of inferoseptal wall, inferior wall,   . ESRD on hemodialysis Started age 45yo    on MWF schedule. Previous failed renal transplant 1998 at [facility] (with rejection in 1998). 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 Resumed HD 10/2006.   [name] DM type 1 (diabetes mellitus, type 1) DX: age 39yo    previously well controlled with Aic 5 (12/2008)  . Intracranial hemorrhage     History of. On prophylactic dilantin.  . 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  . Hyperlipidemia   . Hemopericardium     History of in setting of failed radiogrequency ablation for Vtach  . Anemia of chronic disease     BL Hgb 11-13. In setting of ESRD.   [name] Peptic ulcer disease     Unknown history, no records per [facility].   . Cholelithiasis     Noted at least since 01/2006. Asymptomatic.  . Erectile dysfunction   . Peripheral autonomic neuropathy due to diabetes mellitus   . GERD (gastroesophageal reflux disease)   . Glaucoma   . Squamous cell skin cancer, finger     History of. Marginal resection in 05/2008 with recurrent infection/ lytic lesion involving distal phalanx  . Toe amputation status     Ischemic fourth and fifith toe left (03/2009), history of previous 1,2,3 toe  amputations.   [name] MRSA (methicillin resistant staph aureus) culture positive     Verify type - Per medical history form dated 02/13/10.  [name] Poor circulation     per medical history form dated 02/13/10.  . Ventricular tachycardia     appropriate ATP and Shocks 12/12  . RIATA LEAD     EXTERNALIZATION 12/12 (sk) // high RV threshold  . S/P BKA (below knee amputation)     right  . Arthritis   . Blood transfusion without reported diagnosis   . Heart murmur   . Coronary artery disease   . ICD (implantable cardiac defibrillator) in place   . Legally blind   . Arteriovenous graft for hemodialysis in place, primary 2009    left leg, had a previous graft in right arm that occulded  . Chronic cholecystitis with calculus s/p lap chole Feb 2014 03/11/2012    ROS:   All systems reviewed and negative except as noted in the HPI.   Past Surgical History  Procedure Laterality Date  . Cardiac defibrillator placement  11/05    St Jude  . Transplant pancreatic allograft  2002  . Toe amputation  03/2009    left 4th, 5th toes. Previous 1,2, 3 toe left.  . Kidney transplant  2002  . Above knee leg amputation  11/09/10    Right AKA  . Ablation      2006  .  Cholecystectomy N/A 04/16/2012    Procedure: LAPAROSCOPIC CHOLECYSTECTOMY WITH INTRAOPERATIVE CHOLANGIOGRAM;  Surgeon: Adin Hector, MD;  Location: [facility];  Service: General;  Laterality: N/A;     Family History  Problem Relation Age of Onset  .  Hypertension Brother   . Hypertension Sister   . Coronary artery disease Mother     x 5 vessel graft in her 60s  . Hypertension Mother   . Prostate cancer Father   . Hypertension Father   . Cancer Father     Prostate  . Hypertension Brother      History   Social History  . Marital Status: Married    Spouse Name: N/A    Number of Children: 0  . Years of Education: 12th grade   Occupational History  . UNEMPLOYED    Social History Main Topics  . Smoking status: Never Smoker   . Smokeless tobacco: Never Used  . 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 Drug Use: No  . Sexual Activity: Not on file   Other Topics Concern  . Not on file   Social History Narrative   The patient lives in [place] with his wife and stepdaughter.  He denies any tobacco use.  Does have a history of  previous heavy alcohol use, but has been clean since 1987 and denies any illicit drug use.   He is a retired Conservation officer, nature.      BP 123/62  Pulse 66  Ht 5\' 10"  (1.778 m)  Wt 127 lb 9.6 oz (57.879 kg)  BMI 18.31 kg/m2  Physical Exam:  Chronically ill appearing, middle-age man, looking older than his stated age, NAD HEENT: Unremarkable Neck:  7 cm JVD, no thyromegally Lungs:  Clear with no wheezes, rales, or rhonchi. HEART:  Regular rate rhythm, no murmurs, no rubs, no clicks Abd:  soft, positive bowel sounds, no organomegally, no rebound, no guarding Ext:  2 plus pulses, no edema, no cyanosis, no clubbing, right above-the-knee amputation Skin:  No rashes no nodules Neuro:  CN II through XII intact, motor grossly intact  DEVICE  Normal device function.  See PaceArt for details. RV pacing is at lowest output.    Assess/Plan:

## 2013-10-26 NOTE — Patient Instructions (Signed)
Your physician wants you to follow-up in: 6 months with Dr Taylor You will receive a reminder letter in the mail two months in advance. If you don't receive a letter, please call our office to schedule the follow-up appointment.  

## 2013-10-26 NOTE — Assessment & Plan Note (Signed)
His symptoms are fairly well compensated despite severe LV dysfunction. Will continue current meds. He is maintaining a low sodium diet.

## 2013-10-26 NOTE — Assessment & Plan Note (Signed)
His St. Jude ICD is stable. His RV output is still programmed to the lowest possible setting to save battery and allow for pacing.

## 2013-10-26 NOTE — Assessment & Plan Note (Signed)
He has had no recurrent symptomatic VT. He will continue low dose amiodarone.

## 2013-11-02 ENCOUNTER — Encounter: Payer: Self-pay | Admitting: Internal Medicine

## 2013-11-18 ENCOUNTER — Telehealth: Payer: Self-pay | Admitting: Cardiology

## 2013-11-18 NOTE — Telephone Encounter (Signed)
Informed pt that transmission showed nothing. I requested that he come in office to have it looked at. He agreed to an appt on Thursday 9-24 @ 3PM.

## 2013-11-18 NOTE — Telephone Encounter (Signed)
Spoke with pt and he stated that he was having some pain near his defibrillator. I instructed pt to send manual transmission and  I walked him through this. He verbalized understanding. I asked pt if it felt red and hot and if it was swollen he said he couldn't fell b/c of he had no feeling in his hands and he is legally blind.

## 2013-12-22 ENCOUNTER — Emergency Department (HOSPITAL_COMMUNITY): Payer: Medicare Other

## 2013-12-22 ENCOUNTER — Emergency Department (HOSPITAL_COMMUNITY)
Admission: EM | Admit: 2013-12-22 | Discharge: 2013-12-22 | Disposition: A | Discharge status: Home / Self Care | Payer: Medicare Other | Attending: Emergency Medicine | Admitting: Emergency Medicine

## 2013-12-22 ENCOUNTER — Encounter (HOSPITAL_COMMUNITY): Payer: Self-pay | Admitting: Emergency Medicine

## 2013-12-22 DIAGNOSIS — Z87448 Personal history of other diseases of urinary system: Secondary | ICD-10-CM | POA: Insufficient documentation

## 2013-12-22 DIAGNOSIS — Z89422 Acquired absence of other left toe(s): Secondary | ICD-10-CM | POA: Insufficient documentation

## 2013-12-22 DIAGNOSIS — H548 Legal blindness, as defined in USA: Secondary | ICD-10-CM | POA: Diagnosis not present

## 2013-12-22 DIAGNOSIS — Z95810 Presence of automatic (implantable) cardiac defibrillator: Secondary | ICD-10-CM | POA: Diagnosis not present

## 2013-12-22 DIAGNOSIS — I120 Hypertensive chronic kidney disease with stage 5 chronic kidney disease or end stage renal disease: Secondary | ICD-10-CM | POA: Diagnosis not present

## 2013-12-22 DIAGNOSIS — M1990 Unspecified osteoarthritis, unspecified site: Secondary | ICD-10-CM | POA: Diagnosis not present

## 2013-12-22 DIAGNOSIS — Z8711 Personal history of peptic ulcer disease: Secondary | ICD-10-CM | POA: Insufficient documentation

## 2013-12-22 DIAGNOSIS — E039 Hypothyroidism, unspecified: Secondary | ICD-10-CM | POA: Diagnosis not present

## 2013-12-22 DIAGNOSIS — N186 End stage renal disease: Secondary | ICD-10-CM | POA: Diagnosis not present

## 2013-12-22 DIAGNOSIS — M546 Pain in thoracic spine: Secondary | ICD-10-CM | POA: Insufficient documentation

## 2013-12-22 DIAGNOSIS — R202 Paresthesia of skin: Secondary | ICD-10-CM | POA: Diagnosis not present

## 2013-12-22 DIAGNOSIS — Z862 Personal history of diseases of the blood and blood-forming organs and certain disorders involving the immune mechanism: Secondary | ICD-10-CM | POA: Diagnosis not present

## 2013-12-22 DIAGNOSIS — H409 Unspecified glaucoma: Secondary | ICD-10-CM | POA: Insufficient documentation

## 2013-12-22 DIAGNOSIS — R011 Cardiac murmur, unspecified: Secondary | ICD-10-CM | POA: Diagnosis not present

## 2013-12-22 DIAGNOSIS — E785 Hyperlipidemia, unspecified: Secondary | ICD-10-CM | POA: Diagnosis not present

## 2013-12-22 DIAGNOSIS — M545 Low back pain: Secondary | ICD-10-CM | POA: Diagnosis not present

## 2013-12-22 DIAGNOSIS — R531 Weakness: Secondary | ICD-10-CM | POA: Diagnosis not present

## 2013-12-22 DIAGNOSIS — M25561 Pain in right knee: Secondary | ICD-10-CM | POA: Diagnosis not present

## 2013-12-22 DIAGNOSIS — Z85828 Personal history of other malignant neoplasm of skin: Secondary | ICD-10-CM | POA: Diagnosis not present

## 2013-12-22 DIAGNOSIS — Z940 Kidney transplant status: Secondary | ICD-10-CM | POA: Diagnosis not present

## 2013-12-22 DIAGNOSIS — I2510 Atherosclerotic heart disease of native coronary artery without angina pectoris: Secondary | ICD-10-CM | POA: Diagnosis not present

## 2013-12-22 DIAGNOSIS — Z79899 Other long term (current) drug therapy: Secondary | ICD-10-CM | POA: Insufficient documentation

## 2013-12-22 DIAGNOSIS — K219 Gastro-esophageal reflux disease without esophagitis: Secondary | ICD-10-CM | POA: Diagnosis not present

## 2013-12-22 DIAGNOSIS — R0789 Other chest pain: Secondary | ICD-10-CM

## 2013-12-22 DIAGNOSIS — Z8614 Personal history of Methicillin resistant Staphylococcus aureus infection: Secondary | ICD-10-CM | POA: Diagnosis not present

## 2013-12-22 DIAGNOSIS — R079 Chest pain, unspecified: Secondary | ICD-10-CM | POA: Diagnosis not present

## 2013-12-22 DIAGNOSIS — E109 Type 1 diabetes mellitus without complications: Secondary | ICD-10-CM | POA: Diagnosis not present

## 2013-12-22 DIAGNOSIS — Z89511 Acquired absence of right leg below knee: Secondary | ICD-10-CM | POA: Diagnosis not present

## 2013-12-22 DIAGNOSIS — Z992 Dependence on renal dialysis: Secondary | ICD-10-CM | POA: Insufficient documentation

## 2013-12-22 LAB — CBC WITH DIFFERENTIAL/PLATELET
BASOS ABS: 0 10*3/uL (ref 0.0–0.1)
Basophils Relative: 1 % (ref 0–1)
EOS ABS: 0.2 10*3/uL (ref 0.0–0.7)
EOS PCT: 2 % (ref 0–5)
HCT: 36.2 % — ABNORMAL LOW (ref 39.0–52.0)
Hemoglobin: 12 g/dL — ABNORMAL LOW (ref 13.0–17.0)
LYMPHS PCT: 9 % — AB (ref 12–46)
Lymphs Abs: 0.6 10*3/uL — ABNORMAL LOW (ref 0.7–4.0)
MCH: 32.8 pg (ref 26.0–34.0)
MCHC: 33.1 g/dL (ref 30.0–36.0)
MCV: 98.9 fL (ref 78.0–100.0)
Monocytes Absolute: 0.8 10*3/uL (ref 0.1–1.0)
Monocytes Relative: 13 % — ABNORMAL HIGH (ref 3–12)
NEUTROS PCT: 75 % (ref 43–77)
Neutro Abs: 5 10*3/uL (ref 1.7–7.7)
PLATELETS: 255 10*3/uL (ref 150–400)
RBC: 3.66 MIL/uL — AB (ref 4.22–5.81)
RDW: 16.1 % — AB (ref 11.5–15.5)
WBC: 6.6 10*3/uL (ref 4.0–10.5)

## 2013-12-22 LAB — COMPREHENSIVE METABOLIC PANEL WITH GFR
ALT: 9 U/L (ref 0–53)
AST: 15 U/L (ref 0–37)
Albumin: 2.8 g/dL — ABNORMAL LOW (ref 3.5–5.2)
Alkaline Phosphatase: 182 U/L — ABNORMAL HIGH (ref 39–117)
Anion gap: 12 (ref 5–15)
BUN: 14 mg/dL (ref 6–23)
CO2: 34 meq/L — ABNORMAL HIGH (ref 19–32)
Calcium: 9.4 mg/dL (ref 8.4–10.5)
Chloride: 95 meq/L — ABNORMAL LOW (ref 96–112)
Creatinine, Ser: 3.14 mg/dL — ABNORMAL HIGH (ref 0.50–1.35)
GFR calc Af Amer: 24 mL/min — ABNORMAL LOW
GFR calc non Af Amer: 21 mL/min — ABNORMAL LOW
Glucose, Bld: 102 mg/dL — ABNORMAL HIGH (ref 70–99)
Potassium: 3.7 meq/L (ref 3.7–5.3)
Sodium: 141 meq/L (ref 137–147)
Total Bilirubin: 0.3 mg/dL (ref 0.3–1.2)
Total Protein: 7.4 g/dL (ref 6.0–8.3)

## 2013-12-22 LAB — I-STAT TROPONIN, ED: Troponin i, poc: 0.07 ng/mL (ref 0.00–0.08)

## 2013-12-22 LAB — MAGNESIUM: Magnesium: 2.1 mg/dL (ref 1.5–2.5)

## 2013-12-22 LAB — LIPASE, BLOOD: Lipase: 37 U/L (ref 11–59)

## 2013-12-22 MED ORDER — FENTANYL CITRATE 0.05 MG/ML IJ SOLN
50.0000 ug | Freq: Once | INTRAMUSCULAR | Status: AC
Start: 2013-12-22 — End: 2013-12-22
  Administered 2013-12-22: 50 ug via RESPIRATORY_TRACT
  Filled 2013-12-22: qty 2

## 2013-12-22 MED ORDER — HYDROCODONE-ACETAMINOPHEN 10-500 MG PO TABS: 0.5000 | ORAL_TABLET | Freq: Two times a day (BID) | ORAL | Status: DC | PRN

## 2013-12-22 NOTE — ED Notes (Signed)
Pt reports mid back pain x 1 month but its increasing and changed sides. Woke up this am with numbness sensation and weakness to left leg, has above knee amputation on right leg but reports it feels weak/numb also. No acute distress noted at triage.

## 2013-12-22 NOTE — Discharge Instructions (Signed)
Use Lortab as directed, as needed for pain, but don't drive or operate machinery while taking this medication. Use heat over the affected ares and take all your home medications. See your orthopedist at your regular appointment for ongoing management and MRIs. Your chest pain needs to be followed by your cardiologist, see him in 2 days for follow up. Return to the ER for changes or worsening symptoms.   Back Pain, Adult Low back pain is very common. About 1 in 5 people have back pain.The cause of low back pain is rarely dangerous. The pain often gets better over time.About half of people with a sudden onset of back pain feel better in just 2 weeks. About 8 in 10 people feel better by 6 weeks.  CAUSES Some common causes of back pain include:  Strain of the muscles or ligaments supporting the spine.  Wear and tear (degeneration) of the spinal discs.  Arthritis.  Direct injury to the back. DIAGNOSIS Most of the time, the direct cause of low back pain is not known.However, back pain can be treated effectively even when the exact cause of the pain is unknown.Answering your caregiver's questions about your overall health and symptoms is one of the most accurate ways to make sure the cause of your pain is not dangerous. If your caregiver needs more information, he or she may order lab work or imaging tests (X-rays or MRIs).However, even if imaging tests show changes in your back, this usually does not require surgery. HOME CARE INSTRUCTIONS For many people, back pain returns.Since low back pain is rarely dangerous, it is often a condition that people can learn to [facility] their own.   Remain active. It is stressful on the back to sit or stand in one place. Do not sit, drive, or stand in one place for more than 30 minutes at a time. Take short walks on level surfaces as soon as pain allows.Try to increase the length of time you walk each day.  Do not stay in bed.Resting more than 1 or 2 days  can delay your recovery.  Do not avoid exercise or work.Your body is made to move.It is not dangerous to be active, even though your back may hurt.Your back will likely heal faster if you return to being active before your pain is gone.  Pay attention to your body when you bend and lift. Many people have less discomfortwhen lifting if they bend their knees, keep the load close to their bodies,and avoid twisting. Often, the most comfortable positions are those that put less stress on your recovering back.  Find a comfortable position to sleep. Use a firm mattress and lie on your side with your knees slightly bent. If you lie on your back, put a pillow under your knees.  Only take over-the-counter or prescription medicines as directed by your caregiver. Over-the-counter medicines to reduce pain and inflammation are often the most helpful.Your caregiver may prescribe muscle relaxant drugs.These medicines help dull your pain so you can more quickly return to your normal activities and healthy exercise.  Put ice on the injured area.  Put ice in a plastic bag.  Place a towel between your skin and the bag.  Leave the ice on for 15-20 minutes, 03-04 times a day for the first 2 to 3 days. After that, ice and heat may be alternated to reduce pain and spasms.  Ask your caregiver about trying back exercises and gentle massage. This may be of some benefit.  Avoid feeling  anxious or stressed.Stress increases muscle tension and can worsen back pain.It is important to recognize when you are anxious or stressed and learn ways to manage it.Exercise is a great option. SEEK MEDICAL CARE IF:  You have pain that is not relieved with rest or medicine.  You have pain that does not improve in 1 week.  You have new symptoms.  You are generally not feeling well. SEEK IMMEDIATE MEDICAL CARE IF:   You have pain that radiates from your back into your legs.  You develop new bowel or bladder control  problems.  You have unusual weakness or numbness in your arms or legs.  You develop nausea or vomiting.  You develop abdominal pain.  You feel faint. Document Released: 02/18/2005 Document Revised: 08/20/2011 Document Reviewed: 06/22/2013 [facility] Patient Information 2015 [place], [place]. This information is not intended to replace advice given to you by your health care provider. Make sure you discuss any questions you have with your health care provider.  Chest Pain (Nonspecific) It is often hard to give a specific diagnosis for the cause of chest pain. There is always a chance that your pain could be related to something serious, such as a heart attack or a blood clot in the lungs. You need to follow up with your health care provider for further evaluation. CAUSES   Heartburn.  Pneumonia or bronchitis.  Anxiety or stress.  Inflammation around your heart (pericarditis) or lung (pleuritis or pleurisy).  A blood clot in the lung.  A collapsed lung (pneumothorax). It can develop suddenly on its own (spontaneous pneumothorax) or from trauma to the chest.  Shingles infection (herpes zoster virus). The chest wall is composed of bones, muscles, and cartilage. Any of these can be the source of the pain.  The bones can be bruised by injury.  The muscles or cartilage can be strained by coughing or overwork.  The cartilage can be affected by inflammation and become sore (costochondritis). DIAGNOSIS  Lab tests or other studies may be needed to find the cause of your pain. Your health care provider may have you take a test called an ambulatory electrocardiogram (ECG). An ECG records your heartbeat patterns over a 24-hour period. You may also have other tests, such as:  Transthoracic echocardiogram (TTE). During echocardiography, sound waves are used to evaluate how blood flows through your heart.  Transesophageal echocardiogram (TEE).  Cardiac monitoring. This allows your health care  provider to monitor your heart rate and rhythm in real time.  Holter monitor. This is a portable device that records your heartbeat and can help diagnose heart arrhythmias. It allows your health care provider to track your heart activity for several days, if needed.  Stress tests by exercise or by giving medicine that makes the heart beat faster. TREATMENT   Treatment depends on what may be causing your chest pain. Treatment may include:  Acid blockers for heartburn.  Anti-inflammatory medicine.  Pain medicine for inflammatory conditions.  Antibiotics if an infection is present.  You may be advised to change lifestyle habits. This includes stopping smoking and avoiding alcohol, caffeine, and chocolate.  You may be advised to keep your head raised (elevated) when sleeping. This reduces the chance of acid going backward from your stomach into your esophagus. Most of the time, nonspecific chest pain will improve within 2-3 days with rest and mild pain medicine.  HOME CARE INSTRUCTIONS   If antibiotics were prescribed, take them as directed. Finish them even if you start to feel better.  For the next few days, avoid physical activities that bring on chest pain. Continue physical activities as directed.  Do not use any tobacco products, including cigarettes, chewing tobacco, or electronic cigarettes.  Avoid drinking alcohol.  Only take medicine as directed by your health care provider.  Follow your health care provider's suggestions for further testing if your chest pain does not go away.  Keep any follow-up appointments you made. If you do not go to an appointment, you could develop lasting (chronic) problems with pain. If there is any problem keeping an appointment, call to reschedule. SEEK MEDICAL CARE IF:   Your chest pain does not go away, even after treatment.  You have a rash with blisters on your chest.  You have a fever. SEEK IMMEDIATE MEDICAL CARE IF:   You have  increased chest pain or pain that spreads to your arm, neck, jaw, back, or abdomen.  You have shortness of breath.  You have an increasing cough, or you cough up blood.  You have severe back or abdominal pain.  You feel nauseous or vomit.  You have severe weakness.  You faint.  You have chills. This is an emergency. Do not wait to see if the pain will go away. Get medical help at once. Call your local emergency services (911 in [place] Do not drive yourself to the hospital. MAKE SURE YOU:   Understand these instructions.  Will watch your condition.  Will get help right away if you are not doing well or get worse. Document Released: 11/28/2004 Document Revised: 02/23/2013 Document Reviewed: 09/24/2007 [facility] Patient Information 2015 [place], [place]. This information is not intended to replace advice given to you by your health care provider. Make sure you discuss any questions you have with your health care provider.  Heat Therapy Heat therapy can help make painful, stiff muscles and joints feel better. Do not use heat on new injuries. Wait at least 48 hours after an injury to use heat. Do not use heat when you have aches or pains right after an activity. If you still have pain 3 hours after stopping the activity, then you may use heat. HOME CARE Wet heat pack  Soak a clean towel in warm water. Squeeze out the extra water.  Put the warm, wet towel in a plastic bag.  Place a thin, dry towel between your skin and the bag.  Put the heat pack on the area for 5 minutes, and check your skin. Your skin may be pink, but it should not be red.  Leave the heat pack on the area for 15 to 30 minutes.  Repeat this every 2 to 4 hours while awake. Do not use heat while you are sleeping. Warm water bath  Fill a tub with warm water.  Place the affected body part in the tub.  Soak the area for 20 to 40 minutes.  Repeat as needed. Hot water bottle  Fill the water bottle half full  with hot water.  Press out the extra air. Close the cap tightly.  Place a dry towel between your skin and the bottle.  Put the bottle on the area for 5 minutes, and check your skin. Your skin may be pink, but it should not be red.  Leave the bottle on the area for 15 to 30 minutes.  Repeat this every 2 to 4 hours while awake. Electric heating pad  Place a dry towel between your skin and the heating pad.  Set the heating pad on  low heat.  Put the heating pad on the area for 10 minutes, and check your skin. Your skin may be pink, but it should not be red.  Leave the heating pad on the area for 20 to 40 minutes.  Repeat this every 2 to 4 hours while awake.  Do not lie on the heating pad.  Do not fall asleep while using the heating pad.  Do not use the heating pad near water. GET HELP RIGHT AWAY IF:  You get blisters or red skin.  Your skin is puffy (swollen), or you lose feeling (numbness) in the affected area.  You have any new problems.  Your problems are getting worse.  You have any questions or concerns. If you have any problems, stop using heat therapy until you see your doctor. MAKE SURE YOU:  Understand these instructions.  Will watch your condition.  Will get help right away if you are not doing well or get worse. Document Released: 05/13/2011 Document Reviewed: 04/13/2013 [facility] Patient Information 2015 [place]. This information is not intended to replace advice given to you by your health care provider. Make sure you discuss any questions you have with your health care provider.  Neuropathic Pain We often think that pain has a physical cause. If we get rid of the cause, the pain should go away. Nerves themselves can also cause pain. It is called neuropathic pain, which means nerve abnormality. It may be difficult for the patients who have it and for the treating caregivers. Pain is usually described as acute (short-lived) or chronic (long-lasting).  Acute pain is related to the physical sensations caused by an injury. It can last from a few seconds to many weeks, but it usually goes away when normal healing occurs. Chronic pain lasts beyond the typical healing time. With neuropathic pain, the nerve fibers themselves may be damaged or injured. They then send incorrect signals to other pain centers. The pain you feel is real, but the cause is not easy to find.  CAUSES  Chronic pain can result from diseases, such as diabetes and shingles (an infection related to chickenpox), or from trauma, surgery, or amputation. It can also happen without any known injury or disease. The nerves are sending pain messages, even though there is no identifiable cause for such messages.   Other common causes of neuropathy include diabetes, phantom limb pain, or Regional Pain Syndrome (RPS).  As with all forms of chronic back pain, if neuropathy is not correctly treated, there can be a number of associated problems that lead to a downward cycle for the patient. These include depression, sleeplessness, feelings of fear and anxiety, limited social interaction and inability to do normal daily activities or work.  The most dramatic and mysterious example of neuropathic pain is called "phantom limb syndrome." This occurs when an arm or a leg has been removed because of illness or injury. The brain still gets pain messages from the nerves that originally carried impulses from the missing limb. These nerves now seem to misfire and cause troubling pain.  Neuropathic pain often seems to have no cause. It responds poorly to standard pain treatment. Neuropathic pain can occur after:  Shingles (herpes zoster virus infection).  A lasting burning sensation of the skin, caused usually by injury to a peripheral nerve.  Peripheral neuropathy which is widespread nerve damage, often caused by diabetes or alcoholism.  Phantom limb pain following an amputation.  Facial nerve problems  (trigeminal neuralgia).  Multiple sclerosis.  Reflex  sympathetic dystrophy.  Pain which comes with cancer and cancer chemotherapy.  Entrapment neuropathy such as when pressure is put on a nerve such as in carpal tunnel syndrome.  Back, leg, and hip problems (sciatica).  Spine or back surgery.  HIV Infection or AIDS where nerves are infected by viruses. Your caregiver can explain items in the above list which may apply to you. SYMPTOMS  Characteristics of neuropathic pain are:  Severe, sharp, electric shock-like, shooting, lightening-like, knife-like.  Pins and needles sensation.  Deep burning, deep cold, or deep ache.  Persistent numbness, tingling, or weakness.  Pain resulting from light touch or other stimulus that would not usually cause pain.  Increased sensitivity to something that would normally cause pain, such as a pinprick. Pain may persist for months or years following the healing of damaged tissues. When this happens, pain signals no longer sound an alarm about current injuries or injuries about to happen. Instead, the alarm system itself is not working correctly.  Neuropathic pain may get worse instead of better over time. For some people, it can lead to serious disability. It is important to be aware that severe injury in a limb can occur without a proper, protective pain response.Burns, cuts, and other injuries may go unnoticed. Without proper treatment, these injuries can become infected or lead to further disability. Take any injury seriously, and consult your caregiver for treatment. DIAGNOSIS  When you have a pain with no known cause, your caregiver will probably ask some specific questions:   Do you have any other conditions, such as diabetes, shingles, multiple sclerosis, or HIV infection?  How would you describe your pain? (Neuropathic pain is often described as shooting, stabbing, burning, or searing.)  Is your pain worse at any time of the day?  (Neuropathic pain is usually worse at night.)  Does the pain seem to follow a certain physical pathway?  Does the pain come from an area that has missing or injured nerves? (An example would be phantom limb pain.)  Is the pain triggered by minor things such as rubbing against the sheets at night? These questions often help define the type of pain involved. Once your caregiver knows what is happening, treatment can begin. Anticonvulsant, antidepressant drugs, and various pain relievers seem to work in some cases. If another condition, such as diabetes is involved, better management of that disorder may relieve the neuropathic pain.  TREATMENT  Neuropathic pain is frequently long-lasting and tends not to respond to treatment with narcotic type pain medication. It may respond well to other drugs such as antiseizure and antidepressant medications. Usually, neuropathic problems do not completely go away, but partial improvement is often possible with proper treatment. Your caregivers have large numbers of medications available to treat you. Do not be discouraged if you do not get immediate relief. Sometimes different medications or a combination of medications will be tried before you receive the results you are hoping for. See your caregiver if you have pain that seems to be coming from nowhere and does not go away. Help is available.  SEEK IMMEDIATE MEDICAL CARE IF:   There is a sudden change in the quality of your pain, especially if the change is on only one side of the body.  You notice changes of the skin, such as redness, black or purple discoloration, swelling, or an ulcer.  You cannot move the affected limbs. Document Released: 11/16/2003 Document Revised: 05/13/2011 Document Reviewed: 11/16/2003 [facility] Patient Information 2015 [place], [place]. This information is not  intended to replace advice given to you by your health care provider. Make sure you discuss any questions you have with your  health care provider.

## 2013-12-22 NOTE — ED Provider Notes (Signed)
CSN: [id number]     Arrival date & time 12/22/13  1645 History   First MD Initiated Contact with Patient 12/22/13 1822     Chief Complaint  Patient presents with  . Back Pain  . Extremity Weakness     (Consider location/radiation/quality/duration/timing/severity/associated sxs/prior Treatment) HPI Comments: [name] is a 53 y.o. male with a PMHx of ESRD on dialysis, secondary hyperparathyroidism, hypothyroidism, HTN, NICM s/p cardiac defibrillator implantation, DM1, ICH, HLD, anemia, PUD, cholelithiasis, ED, neuropathy, GERD, glaucoma, legal blindness, SCC finger, multiple amputations including R AKA and L 3rd digit, PVD, VTach, arthritis, heart murmur, CAD, and PSHx of pancreatic and kidney transplant, who presents to the ED with complaints of 1 month of ongoing gradually worsening thoracic back pain x1 month. Reports that he's been seen by Ervin Knack and has MRI scheduled for tomorrow of his cervical and thoracic spine. Reports that his pain is 9/10, sharp and dull, located in the paraspinous muscles around his thoracic spine "between the shoulder blades", constant, nonradiating, worse with movement, and relieved mildly by biofreeze, with no relief from tylenol, muscle relaxers, and heat. Denies known injury or trauma, reports the pain just developed over time. Today he reports that he developed numbness and weakness in his legs, but later he reports that over the course of the month he's gradually been unable to walk with his walker or roll in his wheelchair, which he previously could. Endorses that he also has some b/l arm weakness, which waxes and wanes. States his groin feels like "the underwear is twisted around" but he is not currently wearing underwear. Denies perianal or groin numbness. Does not produce urine, but denies any incontinence of stool. Additionally he's endorsing intermittent CP that has been ongoing for "a while" and he can't give details regarding when it occurs or in  what setting/activity, nor can he describe aggravating or alleviating factors. He can only state that it's located in his left lateral chest, and is a dull nonradiating pain. It only occurs intermittently. He is seen by [facility] cardiology, Dr. Crissie Sickles. Denies any fevers, chills, SOB, cough, hemoptysis, jaw pain, diaphoresis, abd pain, n/v/d, stool changes, rashes or insect bites.   Patient is a 53 y.o. male presenting with back pain. The history is provided by the patient and the spouse. No language interpreter was used.  Back Pain Location:  Thoracic spine Quality: sharp and dull. Radiates to:  Does not radiate Pain severity:  Severe (9/10) Pain is:  Same all the time Onset quality:  Gradual Duration:  1 month Timing:  Constant Progression:  Worsening Chronicity:  New Context comment:  No known injury or inciting event Relieved by:  OTC medications (biofreeze) Worsened by:  Movement Ineffective treatments:  Heating pad, OTC medications and muscle relaxants (muscle relaxants, tylenol, and heat) Associated symptoms: chest pain, leg pain, numbness and weakness   Associated symptoms: no abdominal pain, no bladder incontinence (does not produce urine), no bowel incontinence, no fever, no headaches, no paresthesias, no perianal numbness and no tingling   Chest pain:    Quality:  Dull   Severity:  Mild   Onset quality:  Gradual   Duration: "a while"   Timing:  Intermittent   Progression:  Unchanged   Chronicity:  Recurrent Risk factors: hx of osteoporosis, lack of exercise, steroid use and vascular disease     Past Medical History  Diagnosis Date  . Secondary hyperparathyroidism, renal     In setting of ESRD.  [name] Hypothyroidism   .  Automatic implantable cardiac defibrillator in situ     CRT-ICD   . Hypertension   . Nonischemic cardiomyopathy     2D-echocardiogram (08/2007) - LV EF 45%, akinesis of inferoseptal wall, inferior wall,   . ESRD on hemodialysis Started age 76yo    on  MWF schedule. Previous failed renal transplant 1998 at [facility] (with rejection in 1998). 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 Resumed HD 10/2006.   [name] DM type 1 (diabetes mellitus, type 1) DX: age 60yo    previously well controlled with Aic 5 (12/2008)  . Intracranial hemorrhage     History of. On prophylactic dilantin.  . 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  . Hyperlipidemia   . Hemopericardium     History of in setting of failed radiogrequency ablation for Vtach  . Anemia of chronic disease     BL Hgb 11-13. In setting of ESRD.   [name] Peptic ulcer disease     Unknown history, no records per [employer].   . Cholelithiasis     Noted at least since 01/2006. Asymptomatic.  . Erectile dysfunction   . Peripheral autonomic neuropathy due to diabetes mellitus   . GERD (gastroesophageal reflux disease)   . Glaucoma   . Squamous cell skin cancer, finger     History of. Marginal resection in 05/2008 with recurrent infection/ lytic lesion involving distal phalanx  . Toe amputation status     Ischemic fourth and fifith toe left (03/2009), history of previous 1,2,3 toe  amputations.   [name] MRSA (methicillin resistant staph aureus) culture positive     Verify type - Per medical history form dated 02/13/10.  [name] Poor circulation     per medical history form dated 02/13/10.  . Ventricular tachycardia     appropriate ATP and Shocks 12/12  . RIATA LEAD     EXTERNALIZATION 12/12 (sk) // high RV threshold  . S/P BKA (below knee amputation)     right  . Arthritis   . Blood transfusion without reported diagnosis   . Heart murmur   . Coronary artery disease   . ICD (implantable cardiac defibrillator) in place   . Legally blind   . Arteriovenous graft for hemodialysis in place, primary 2009    left leg, had a previous graft in right arm that occulded  . Chronic cholecystitis  with calculus s/p lap chole Feb 2014 03/11/2012   Past Surgical History  Procedure Laterality Date  . Cardiac defibrillator placement  11/05    St Jude  . Transplant pancreatic allograft  2002  . Toe amputation  03/2009    left 4th, 5th toes. Previous 1,2, 3 toe left.  . Kidney transplant  2002  . Above knee leg amputation  11/09/10    Right AKA  . Ablation      2006  . Cholecystectomy N/A 04/16/2012    Procedure: LAPAROSCOPIC CHOLECYSTECTOMY WITH INTRAOPERATIVE CHOLANGIOGRAM;  Surgeon: Adin Hector, MD;  Location: [facility];  Service: General;  Laterality: N/A;   Family History  Problem Relation Age of Onset  . Hypertension Brother   . Hypertension Sister   . Coronary artery disease Mother     x 5 vessel graft in her 1s  . Hypertension Mother   . Prostate cancer Father   . Hypertension Father   . Cancer Father     Prostate  . Hypertension Brother  History  Substance Use Topics  . Smoking status: Never Smoker   . Smokeless tobacco: Never Used  . Alcohol Use: No     Comment: previously drank heavily from 1980-1987    Review of Systems  Constitutional: Negative for fever, chills and unexpected weight change.  Respiratory: Negative for cough, chest tightness, shortness of breath and wheezing.   Cardiovascular: Positive for chest pain. Negative for palpitations and leg swelling.  Gastrointestinal: Negative for nausea, vomiting, abdominal pain, diarrhea and bowel incontinence.  Genitourinary: Negative for bladder incontinence (does not produce urine) and penile pain.  Musculoskeletal: Positive for back pain and gait problem. Negative for arthralgias, joint swelling and myalgias.  Skin: Negative for color change.  Allergic/Immunologic: Positive for immunocompromised state.  Neurological: Positive for weakness and numbness. Negative for dizziness, tingling, tremors, syncope, light-headedness, headaches and paresthesias.  Psychiatric/Behavioral: Negative for confusion.   10  Systems reviewed and are negative for acute change except as noted in the HPI.    Allergies  Avelox; Ceftriaxone sodium; Dilaudid; Protamine; Rocephin; Ciprofloxacin; and Moxifloxacin  Home Medications   Prior to Admission medications   Medication Sig Start Date End Date Taking? Authorizing Provider  acetaminophen (TYLENOL) 325 MG tablet Take 650 mg by mouth every 6 (six) hours as needed. For pain/fever     Historical Provider, MD  amiodarone (PACERONE) 200 MG tablet Take 200 mg by mouth daily.    Historical Provider, MD  azaTHIOprine (IMURAN) 50 MG tablet Take 75 mg by mouth daily.  12/07/11   Historical Provider, MD  bimatoprost (LUMIGAN) 0.03 % ophthalmic solution Apply 1 drop to eye at bedtime.      Historical Provider, MD  Calcium Polycarbophil (EQUALACTIN PO) Take 2 tablets by mouth 2 (two) times daily.     Historical Provider, MD  carvedilol (COREG) 3.125 MG tablet Take 3.125 mg by mouth See admin instructions. Takes after dialysis on Monday, Wednesday, and Friday    Historical Provider, MD  gabapentin (NEURONTIN) 100 MG capsule Take 200 mg by mouth at bedtime.  09/09/12   Historical Provider, MD  lanthanum (FOSRENOL) 1000 MG chewable tablet Chew 1,000 mg by mouth 3 (three) times daily with meals. And with snacks    Historical Provider, MD  levothyroxine (SYNTHROID, LEVOTHROID) 75 MCG tablet Take 225 mcg by mouth daily.  12/31/11   Historical Provider, MD  Lutein 20 MG TABS Take 1 tablet by mouth daily.     Historical Provider, MD  multivitamin (RENA-VIT) TABS tablet Take 1 tablet by mouth daily.    Historical Provider, MD  nitroGLYCERIN (NITROSTAT) 0.4 MG SL tablet Place 0.4 mg under the tongue every 5 (five) minutes as needed for chest pain.    Historical Provider, MD  nizatidine (AXID) 150 MG capsule Take 150 mg by mouth at bedtime.      Historical Provider, MD  Omega-3 Fatty Acids (SALMON OIL-1000) 200 MG CAPS Take 2,000 mg by mouth daily.     Historical Provider, MD  phenytoin  (DILANTIN) 100 MG ER capsule Take 100 mg by mouth 3 (three) times daily.     Historical Provider, MD  predniSONE (DELTASONE) 5 MG tablet Take 5 mg by mouth daily.     Historical Provider, MD  rosuvastatin (CRESTOR) 5 MG tablet Take 5 mg by mouth every other day.     Historical Provider, MD  tacrolimus (PROGRAF) 1 MG capsule Take 2 mg by mouth 2 (two) times daily.      Historical Provider, MD  vitamin C (ASCORBIC ACID)  500 MG tablet Take 500 mg by mouth 2 (two) times daily.     Historical Provider, MD   BP 115/45  Pulse 71  Temp(Src) 97.7 F (36.5 C)  Resp 18  SpO2 95% Physical Exam  Nursing note and vitals reviewed. Constitutional: He is oriented to person, place, and time. Vital signs are normal. He appears well-developed. He appears distressed (uncomfortable).  Thin frail gentleman laying in bed, appears somewhat uncomfortable. Afebrile, nontoxic, VSS  HENT:  Head: Normocephalic and atraumatic.  Mouth/Throat: Oropharynx is clear and moist and mucous membranes are normal.  Eyes: Conjunctivae and EOM are normal. Right eye exhibits no discharge. Left eye exhibits no discharge.  Neck: Normal range of motion. Neck supple.  FROM intact without spinous process or paraspinous muscle TTP, no bony stepoffs or deformities, no muscle spasms. No rigidity or meningeal signs. No bruising or swelling.  Cardiovascular: Normal rate, regular rhythm and intact distal pulses.  Exam reveals no gallop and no friction rub.   Murmur heard. Pulses:      Popliteal pulses are 0 on the right side.       Dorsalis pedis pulses are 0 on the right side.       Posterior tibial pulses are 0 on the right side.  Regular rate with intermittent PVCs, murmur audible, no rubs or gallops. R AKA therefore no distal pulses in RLE  Pulmonary/Chest: Effort normal and breath sounds normal. No respiratory distress. He has no decreased breath sounds. He has no wheezes. He has no rhonchi. He has no rales. He exhibits no tenderness.    CTAB in all lung fields, although difficult to examine due to pt being uncomfortable and semi-uncooperative with full exam No chest wall TTP  Abdominal: Soft. Normal appearance and bowel sounds are normal. He exhibits no distension. There is no tenderness. There is no rigidity, no rebound, no guarding and no CVA tenderness.  Musculoskeletal: Normal range of motion.       Right knee: He exhibits deformity (R AKA).       Thoracic back: He exhibits tenderness.       Lumbar back: He exhibits tenderness.       Back:       Legs: R above the knee amputation. Able to flex and extend at R hip and move the stump well L leg with limited AROM due to pain in back. Strength 3/5 with dorsiflexion/plantarflexion, and unable to lift leg off the bed without significant pain. Sensation at baseline (pt has neuropathy) in all extremities. Grip strength 5/5 in b/l extremities. FROM in b/l upper extremities.  c-spine exam as above Thoracic spine with TTP in paraspinous muscles bilaterally, no spasm palpated. Lumbar spine with diffuse TTP, some midline TTP without deformity or crepitus, and some in b/l paraspinous muscles.   Neurological: He is alert and oriented to person, place, and time. No sensory deficit.  Skin: Skin is warm, dry and intact. No rash noted.  Psychiatric: He has a normal mood and affect.    ED Course  Procedures (including critical care time) Labs Review Labs Reviewed  CBC WITH DIFFERENTIAL - Abnormal; Notable for the following:    RBC 3.66 (*)    Hemoglobin 12.0 (*)    HCT 36.2 (*)    RDW 16.1 (*)    Lymphocytes Relative 9 (*)    Lymphs Abs 0.6 (*)    Monocytes Relative 13 (*)    All other components within normal limits  COMPREHENSIVE METABOLIC PANEL - Abnormal; Notable  for the following:    Chloride 95 (*)    CO2 34 (*)    Glucose, Bld 102 (*)    Creatinine, Ser 3.14 (*)    Albumin 2.8 (*)    Alkaline Phosphatase 182 (*)    GFR calc non Af Amer 21 (*)    GFR calc Af Amer 24  (*)    All other components within normal limits  MAGNESIUM  LIPASE, BLOOD  I-STAT TROPOININ, ED    Imaging Review Dg Chest 2 View  12/22/2013   CLINICAL DATA:  53 year old male with chest pain for 1 month. Associated left lower extremity numbness and weakness. Initial encounter.  EXAM: CHEST  2 VIEW  COMPARISON:  11/23/2013 and earlier.  FINDINGS: Stable right chest cardiac AICD. Stable cardiomegaly and mediastinal contours. Visualized tracheal air column is within normal limits. Multilevel chronic left rib fractures. No pneumothorax, pulmonary edema, pleural effusion or confluent pulmonary opacity. Osteopenia. No acute osseous abnormality identified.  IMPRESSION: No acute cardiopulmonary abnormality.   Electronically Signed   By: Lars Pinks M.D.   On: 12/22/2013 20:22     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 wander in lead(s) V3 Electronic  ventricular pacemaker Confirmed by ZACKOWSKI  MD, SCOTT [phone]) on  12/22/2013 7:42:54 PM      MDM   Final diagnoses:  Bilateral thoracic back pain  Chest pain of uncertain etiology  Weakness  ESRD (end stage renal disease)  Paresthesias    53y/o male with multiple comorbidities, here for back pain, has appt tomorrow for cervical/thoracic spine MRI, unclear as to why his lumbar spine is not included given pt's symptoms. Able to still move both lower extremities, and baseline sensation. Has diabetic neuropathy and hasn't taken gabapentin today. Since pt has outpt MRI scheduled, will defer to his orthopedist for MRI needs. Additionally pt here with ongoing CP for "a while". Pt is a poor and difficult historian. Will obtain labs and CXR, as well as EKG. [facility] cardiology, Dr. Crissie Sickles. Pt does not produce urine due to being ESRD on dialysis, followed by [facility]. Will give fentanyl for pain since pt has sensitivity to dilaudid, and is a dialysis pt. Will reassess shortly.  8:26 PM EKG showing PVCs, paced rhythm. Trop neg, CBC w/diff showing baseline anemia, CMP showing baseline hypochloremia and elevated bicarb as well as baseline elevations in Cr of 3.14. Alk phos chronically elevated, no transaminitis. Mg and lipase WNL. CXR neg. No clear etiology for his chest pain, but does not appear cardiac in nature. Will have him see his cardiologist for ongoing care.   9:06 PM Attempted to discuss plan of care with pt and family. Pt is adamant that he wants "something done" and wants his "full back" MRI'd. Case discussed with Dr. Rogene Houston previously, but will have him assist in discussing reasoning behind not doing this in the ED. No indications to admit pt aside from pain control. Of note, wife reports that regarding his cardiac pain, Dr. Lovena Le has already seen him when it initially began, and was not concerned. Discussed that follow up with him would still be advisable.  10:18 PM Dr. Rogene Houston in to see the pt. Discussed plan with pt. Pt agrees to f/up with his orthopedist  tomorrow at his regular appointment. Will give Rx for small amount of narcotic pain meds as needed for pain. Pt already on prednisone. I explained the diagnosis and have given explicit precautions to return to the ER including for any other new or worsening symptoms. The patient understands and accepts the medical plan as it's been dictated and I have answered their questions. Discharge instructions concerning home care and prescriptions have been given. The patient is STABLE and is discharged to home in good condition.  BP 129/61  Pulse 66  Temp(Src) 97.7 F (36.5 C)  Resp 12  SpO2 94%  Meds ordered this encounter  Medications  . fentaNYL (SUBLIMAZE) injection 50 mcg    Sig:   .  methylPREDNISolone (MEDROL) 4 MG tablet    Sig: Take 4 mg by mouth daily. 21 tablet dosepack  . HYDROcodone-acetaminophen (LORTAB) 10-500 MG per tablet    Sig: Take 0.5 tablets by mouth 2 (two) times daily as needed for pain.    Dispense:  5 tablet    Refill:  0    Order Specific Question:  Supervising Provider    Answer:  Johnna Acosta [address] [place], PA-C 12/22/13 2221

## 2013-12-22 NOTE — ED Notes (Signed)
Pt. C/o bilateral shoulder pain x4-5 weeks. States pain radiates down to elbows, denies numbness/tingling, radial pulses intact. Has tried biofreeze and muscle relaxers with no relief. Pt. Also c/o left sided leg pain and weakness that has increased this past week. Has been unable to use walker and ambulate per normal.

## 2013-12-22 NOTE — ED Provider Notes (Signed)
Medical screening examination/treatment/procedure(s) were conducted as a shared visit with non-physician practitioner(s) and myself.  I personally evaluated the patient during the encounter.   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 wander in lead(s) V3 Electronic  ventricular pacemaker Confirmed by Ziza Hastings  MD, Eltha Tingley [phone]) on  12/22/2013 7:42:54 PM       Results for orders placed during the hospital encounter of 12/22/13  CBC WITH DIFFERENTIAL      Result Value Ref Range   WBC 6.6  4.0 - 10.5 K/uL   RBC 3.66 (*) 4.22 - 5.81 MIL/uL   Hemoglobin 12.0 (*) 13.0 - 17.0 g/dL   HCT 36.2 (*) 39.0 - 52.0 %   MCV 98.9  78.0 - 100.0 fL   MCH 32.8  26.0 - 34.0 pg   MCHC 33.1  30.0 - 36.0 g/dL   RDW 16.1 (*) 11.5 - 15.5 %   Platelets 255  150 - 400 K/uL   Neutrophils Relative % 75  43 - 77 %   Neutro Abs 5.0  1.7 - 7.7 K/uL   Lymphocytes Relative 9 (*) 12 - 46 %   Lymphs Abs 0.6 (*) 0.7 - 4.0 K/uL   Monocytes Relative 13 (*) 3 - 12 %   Monocytes Absolute 0.8  0.1 - 1.0 K/uL   Eosinophils Relative 2  0 - 5 %   Eosinophils Absolute 0.2  0.0 - 0.7 K/uL   Basophils Relative 1  0 - 1 %   Basophils Absolute 0.0  0.0 - 0.1 K/uL  COMPREHENSIVE METABOLIC PANEL      Result Value Ref Range   Sodium 141  137 - 147 mEq/L   Potassium 3.7  3.7 - 5.3 mEq/L   Chloride 95 (*) 96 - 112 mEq/L   CO2 34 (*) 19 - 32 mEq/L   Glucose, Bld 102 (*) 70 - 99 mg/dL   BUN 14  6 - 23 mg/dL   Creatinine, Ser 3.14 (*) 0.50 - 1.35 mg/dL   Calcium 9.4  8.4 - 10.5 mg/dL   Total Protein 7.4  6.0 - 8.3 g/dL   Albumin 2.8 (*) 3.5 - 5.2 g/dL   AST 15  0 - 37 U/L   ALT 9  0 - 53 U/L   Alkaline Phosphatase 182 (*) 39 - 117 U/L   Total Bilirubin 0.3  0.3 - 1.2  mg/dL   GFR calc non Af Amer 21 (*) >90 mL/min   GFR calc Af Amer 24 (*) >90 mL/min   Anion gap 12  5 - 15  MAGNESIUM      Result Value Ref Range   Magnesium 2.1  1.5 - 2.5 mg/dL  LIPASE, BLOOD      Result Value Ref Range   Lipase 37  11 - 59 U/L  I-STAT TROPOININ, ED      Result Value Ref Range   Troponin i, poc 0.07  0.00 - 0.08 ng/mL   Comment 3            Results for orders placed during the hospital encounter of 12/22/13  CBC WITH DIFFERENTIAL      Result Value Ref Range   WBC 6.6  4.0 - 10.5 K/uL   RBC 3.66 (*) 4.22 - 5.81  MIL/uL   Hemoglobin 12.0 (*) 13.0 - 17.0 g/dL   HCT 36.2 (*) 39.0 - 52.0 %   MCV 98.9  78.0 - 100.0 fL   MCH 32.8  26.0 - 34.0 pg   MCHC 33.1  30.0 - 36.0 g/dL   RDW 16.1 (*) 11.5 - 15.5 %   Platelets 255  150 - 400 K/uL   Neutrophils Relative % 75  43 - 77 %   Neutro Abs 5.0  1.7 - 7.7 K/uL   Lymphocytes Relative 9 (*) 12 - 46 %   Lymphs Abs 0.6 (*) 0.7 - 4.0 K/uL   Monocytes Relative 13 (*) 3 - 12 %   Monocytes Absolute 0.8  0.1 - 1.0 K/uL   Eosinophils Relative 2  0 - 5 %   Eosinophils Absolute 0.2  0.0 - 0.7 K/uL   Basophils Relative 1  0 - 1 %   Basophils Absolute 0.0  0.0 - 0.1 K/uL  COMPREHENSIVE METABOLIC PANEL      Result Value Ref Range   Sodium 141  137 - 147 mEq/L   Potassium 3.7  3.7 - 5.3 mEq/L   Chloride 95 (*) 96 - 112 mEq/L   CO2 34 (*) 19 - 32 mEq/L   Glucose, Bld 102 (*) 70 - 99 mg/dL   BUN 14  6 - 23 mg/dL   Creatinine, Ser 3.14 (*) 0.50 - 1.35 mg/dL   Calcium 9.4  8.4 - 10.5 mg/dL   Total Protein 7.4  6.0 - 8.3 g/dL   Albumin 2.8 (*) 3.5 - 5.2 g/dL   AST 15  0 - 37 U/L   ALT 9  0 - 53 U/L   Alkaline Phosphatase 182 (*) 39 - 117 U/L   Total Bilirubin 0.3  0.3 - 1.2 mg/dL   GFR calc non Af Amer 21 (*) >90 mL/min   GFR calc Af Amer 24 (*) >90 mL/min   Anion gap 12  5 - 15  MAGNESIUM      Result Value Ref Range   Magnesium 2.1  1.5 - 2.5 mg/dL  LIPASE, BLOOD      Result Value Ref Range   Lipase 37  11 - 59 U/L   I-STAT TROPOININ, ED      Result Value Ref Range   Troponin i, poc 0.07  0.00 - 0.08 ng/mL   Comment 3            Dg Chest 2 View  12/22/2013   CLINICAL DATA:  53 year old male with chest pain for 1 month. Associated left lower extremity numbness and weakness. Initial encounter.  EXAM: CHEST  2 VIEW  COMPARISON:  11/23/2013 and earlier.  FINDINGS: Stable right chest cardiac AICD. Stable cardiomegaly and mediastinal contours. Visualized tracheal air column is within normal limits. Multilevel chronic left rib fractures. No pneumothorax, pulmonary edema, pleural effusion or confluent pulmonary opacity. Osteopenia. No acute osseous abnormality identified.  IMPRESSION: No acute cardiopulmonary abnormality.   Electronically Signed   By: Lars Pinks M.D.   On: 12/22/2013 20:22      The patient seen by me. The patient with complaint of mid back pain for a month. Followed by orthopedic group in the [place] area. Patient has MRI it sounds like of cervical and upper thoracic spine ordered for tomorrow. Patient also with complaint of lower back pain. Predominantly on the left. Patient's right leg is an above the knee". The left leg is intact. He has some numbness to sensation and weakness  to the leg mostly the weakness is due to pain. No incontinence. Patient has a history of severe long-term diabetes. Patient has a renal transplant. Patient without fever.  Patient also was complaining of some chest pain. On and off for several days or longer. Workup for that here shows no evidence of an acute cardiac event. He chest x-ray was negative. Patient has followup with his regular doctors [place]. Recommended that the patient contact his orthopedic group for them to add the lumbar part on for MRI. We consider doing lumbar MRI here but then was confused why they were doing cervical and and thoracic MRI was not clear the indications for that so we felt the best is to give Tylenol one time. There was no emergent need to  be done tonight. Patient can get that done as an outpatient. Followup with orthopedic doctors. Patient has reasonable movement of the left leg is already wheelchair-bound. Patient does have significant other that can take care of him.  Fredia Sorrow, MD 12/22/13 2218

## 2013-12-23 ENCOUNTER — Telehealth: Payer: Self-pay | Admitting: Internal Medicine

## 2013-12-23 NOTE — Telephone Encounter (Signed)
Spoke with patient and let him know he can not have a MRI

## 2013-12-23 NOTE — Telephone Encounter (Signed)
New Message  Pt called states that he will need an MRI becuase of his back pain. Pt requests a call back to discuss if it is ok for him to have an MRI being that he has a pacer. Please call

## 2013-12-23 NOTE — Telephone Encounter (Signed)
New message    Patient having mri today - it was cancel due to him having a [employer] pacemaker.  Are they any restriction from Dr. Lovena Le stand point.    Done at [facility]  Phone # [phone]

## 2013-12-23 NOTE — Telephone Encounter (Signed)
Spoke w/ Lenna Sciara at [facility]. Let her know pt cannot have an MRI. Pt has a SJM CRT-D. Melissa will update their notes.

## 2013-12-26 ENCOUNTER — Emergency Department (HOSPITAL_COMMUNITY): Payer: Medicare Other

## 2013-12-26 ENCOUNTER — Encounter (HOSPITAL_COMMUNITY): Payer: Self-pay | Admitting: Emergency Medicine

## 2013-12-26 ENCOUNTER — Inpatient Hospital Stay (HOSPITAL_COMMUNITY)
Admission: EM | Admit: 2013-12-26 | Discharge: 2014-01-03 | DRG: 814 | Disposition: A | Discharge status: Hospice | Payer: Medicare Other | Attending: Internal Medicine | Admitting: Internal Medicine

## 2013-12-26 DIAGNOSIS — K219 Gastro-esophageal reflux disease without esophagitis: Secondary | ICD-10-CM | POA: Diagnosis present

## 2013-12-26 DIAGNOSIS — Y830 Surgical operation with transplant of whole organ as the cause of abnormal reaction of the patient, or of later complication, without mention of misadventure at the time of the procedure: Secondary | ICD-10-CM | POA: Diagnosis present

## 2013-12-26 DIAGNOSIS — M8448XA Pathological fracture, other site, initial encounter for fracture: Secondary | ICD-10-CM | POA: Diagnosis present

## 2013-12-26 DIAGNOSIS — I2510 Atherosclerotic heart disease of native coronary artery without angina pectoris: Secondary | ICD-10-CM | POA: Diagnosis present

## 2013-12-26 DIAGNOSIS — Z85828 Personal history of other malignant neoplasm of skin: Secondary | ICD-10-CM

## 2013-12-26 DIAGNOSIS — M489 Spondylopathy, unspecified: Secondary | ICD-10-CM

## 2013-12-26 DIAGNOSIS — E1043 Type 1 diabetes mellitus with diabetic autonomic (poly)neuropathy: Secondary | ICD-10-CM | POA: Diagnosis present

## 2013-12-26 DIAGNOSIS — T86890 Other transplanted tissue rejection: Secondary | ICD-10-CM

## 2013-12-26 DIAGNOSIS — I898 Other specified noninfective disorders of lymphatic vessels and lymph nodes: Principal | ICD-10-CM | POA: Diagnosis present

## 2013-12-26 DIAGNOSIS — H548 Legal blindness, as defined in USA: Secondary | ICD-10-CM | POA: Diagnosis present

## 2013-12-26 DIAGNOSIS — Z89422 Acquired absence of other left toe(s): Secondary | ICD-10-CM | POA: Diagnosis not present

## 2013-12-26 DIAGNOSIS — Z66 Do not resuscitate: Secondary | ICD-10-CM | POA: Diagnosis not present

## 2013-12-26 DIAGNOSIS — M549 Dorsalgia, unspecified: Secondary | ICD-10-CM

## 2013-12-26 DIAGNOSIS — Z89611 Acquired absence of right leg above knee: Secondary | ICD-10-CM

## 2013-12-26 DIAGNOSIS — K769 Liver disease, unspecified: Secondary | ICD-10-CM

## 2013-12-26 DIAGNOSIS — Z992 Dependence on renal dialysis: Secondary | ICD-10-CM | POA: Diagnosis not present

## 2013-12-26 DIAGNOSIS — I472 Ventricular tachycardia: Secondary | ICD-10-CM | POA: Diagnosis present

## 2013-12-26 DIAGNOSIS — Z9483 Pancreas transplant status: Secondary | ICD-10-CM

## 2013-12-26 DIAGNOSIS — D631 Anemia in chronic kidney disease: Secondary | ICD-10-CM | POA: Diagnosis present

## 2013-12-26 DIAGNOSIS — D479 Neoplasm of uncertain behavior of lymphoid, hematopoietic and related tissue, unspecified: Secondary | ICD-10-CM | POA: Diagnosis present

## 2013-12-26 DIAGNOSIS — E785 Hyperlipidemia, unspecified: Secondary | ICD-10-CM

## 2013-12-26 DIAGNOSIS — Z7952 Long term (current) use of systemic steroids: Secondary | ICD-10-CM

## 2013-12-26 DIAGNOSIS — M488X4 Other specified spondylopathies, thoracic region: Secondary | ICD-10-CM | POA: Diagnosis present

## 2013-12-26 DIAGNOSIS — H409 Unspecified glaucoma: Secondary | ICD-10-CM | POA: Diagnosis present

## 2013-12-26 DIAGNOSIS — T8612 Kidney transplant failure: Secondary | ICD-10-CM | POA: Diagnosis present

## 2013-12-26 DIAGNOSIS — Z794 Long term (current) use of insulin: Secondary | ICD-10-CM | POA: Diagnosis not present

## 2013-12-26 DIAGNOSIS — E039 Hypothyroidism, unspecified: Secondary | ICD-10-CM | POA: Diagnosis present

## 2013-12-26 DIAGNOSIS — K2100 Gastro-esophageal reflux disease with esophagitis, without bleeding: Secondary | ICD-10-CM

## 2013-12-26 DIAGNOSIS — D72829 Elevated white blood cell count, unspecified: Secondary | ICD-10-CM | POA: Diagnosis present

## 2013-12-26 DIAGNOSIS — Z951 Presence of aortocoronary bypass graft: Secondary | ICD-10-CM

## 2013-12-26 DIAGNOSIS — Z8614 Personal history of Methicillin resistant Staphylococcus aureus infection: Secondary | ICD-10-CM | POA: Diagnosis not present

## 2013-12-26 DIAGNOSIS — I120 Hypertensive chronic kidney disease with stage 5 chronic kidney disease or end stage renal disease: Secondary | ICD-10-CM | POA: Diagnosis present

## 2013-12-26 DIAGNOSIS — I10 Essential (primary) hypertension: Secondary | ICD-10-CM

## 2013-12-26 DIAGNOSIS — N186 End stage renal disease: Secondary | ICD-10-CM

## 2013-12-26 DIAGNOSIS — R197 Diarrhea, unspecified: Secondary | ICD-10-CM

## 2013-12-26 DIAGNOSIS — E119 Type 2 diabetes mellitus without complications: Secondary | ICD-10-CM

## 2013-12-26 DIAGNOSIS — I959 Hypotension, unspecified: Secondary | ICD-10-CM | POA: Diagnosis present

## 2013-12-26 DIAGNOSIS — I429 Cardiomyopathy, unspecified: Secondary | ICD-10-CM | POA: Diagnosis present

## 2013-12-26 DIAGNOSIS — I5022 Chronic systolic (congestive) heart failure: Secondary | ICD-10-CM

## 2013-12-26 DIAGNOSIS — G839 Paralytic syndrome, unspecified: Secondary | ICD-10-CM | POA: Diagnosis present

## 2013-12-26 DIAGNOSIS — Z940 Kidney transplant status: Secondary | ICD-10-CM

## 2013-12-26 DIAGNOSIS — Z89022 Acquired absence of left finger(s): Secondary | ICD-10-CM

## 2013-12-26 DIAGNOSIS — R222 Localized swelling, mass and lump, trunk: Secondary | ICD-10-CM

## 2013-12-26 DIAGNOSIS — Z95810 Presence of automatic (implantable) cardiac defibrillator: Secondary | ICD-10-CM | POA: Diagnosis not present

## 2013-12-26 DIAGNOSIS — N2581 Secondary hyperparathyroidism of renal origin: Secondary | ICD-10-CM | POA: Diagnosis present

## 2013-12-26 DIAGNOSIS — R531 Weakness: Secondary | ICD-10-CM

## 2013-12-26 DIAGNOSIS — I950 Idiopathic hypotension: Secondary | ICD-10-CM

## 2013-12-26 DIAGNOSIS — Z79899 Other long term (current) drug therapy: Secondary | ICD-10-CM | POA: Diagnosis not present

## 2013-12-26 DIAGNOSIS — T380X5A Adverse effect of glucocorticoids and synthetic analogues, initial encounter: Secondary | ICD-10-CM | POA: Diagnosis present

## 2013-12-26 DIAGNOSIS — I739 Peripheral vascular disease, unspecified: Secondary | ICD-10-CM | POA: Diagnosis present

## 2013-12-26 DIAGNOSIS — R159 Full incontinence of feces: Secondary | ICD-10-CM | POA: Diagnosis present

## 2013-12-26 DIAGNOSIS — R509 Fever, unspecified: Secondary | ICD-10-CM

## 2013-12-26 DIAGNOSIS — Z515 Encounter for palliative care: Secondary | ICD-10-CM

## 2013-12-26 DIAGNOSIS — H540 Blindness, both eyes: Secondary | ICD-10-CM | POA: Diagnosis present

## 2013-12-26 DIAGNOSIS — Z89619 Acquired absence of unspecified leg above knee: Secondary | ICD-10-CM

## 2013-12-26 DIAGNOSIS — R079 Chest pain, unspecified: Secondary | ICD-10-CM

## 2013-12-26 LAB — CBC WITH DIFFERENTIAL/PLATELET
Basophils Absolute: 0 10*3/uL (ref 0.0–0.1)
Basophils Relative: 0 % (ref 0–1)
EOS PCT: 1 % (ref 0–5)
Eosinophils Absolute: 0.1 10*3/uL (ref 0.0–0.7)
HCT: 33.3 % — ABNORMAL LOW (ref 39.0–52.0)
Hemoglobin: 10.9 g/dL — ABNORMAL LOW (ref 13.0–17.0)
LYMPHS ABS: 0.7 10*3/uL (ref 0.7–4.0)
Lymphocytes Relative: 5 % — ABNORMAL LOW (ref 12–46)
MCH: 32.4 pg (ref 26.0–34.0)
MCHC: 32.7 g/dL (ref 30.0–36.0)
MCV: 99.1 fL (ref 78.0–100.0)
Monocytes Absolute: 1.3 10*3/uL — ABNORMAL HIGH (ref 0.1–1.0)
Monocytes Relative: 10 % (ref 3–12)
NEUTROS PCT: 84 % — AB (ref 43–77)
Neutro Abs: 10.5 10*3/uL — ABNORMAL HIGH (ref 1.7–7.7)
Platelets: 223 10*3/uL (ref 150–400)
RBC: 3.36 MIL/uL — AB (ref 4.22–5.81)
RDW: 16.5 % — ABNORMAL HIGH (ref 11.5–15.5)
WBC: 12.6 10*3/uL — AB (ref 4.0–10.5)

## 2013-12-26 LAB — I-STAT CHEM 8, ED
BUN: 41 mg/dL — ABNORMAL HIGH (ref 6–23)
CHLORIDE: 96 meq/L (ref 96–112)
CREATININE: 4.9 mg/dL — AB (ref 0.50–1.35)
Calcium, Ion: 1.13 mmol/L (ref 1.12–1.23)
Glucose, Bld: 90 mg/dL (ref 70–99)
HCT: 36 % — ABNORMAL LOW (ref 39.0–52.0)
Hemoglobin: 12.2 g/dL — ABNORMAL LOW (ref 13.0–17.0)
POTASSIUM: 4.3 meq/L (ref 3.7–5.3)
SODIUM: 137 meq/L (ref 137–147)
TCO2: 33 mmol/L (ref 0–100)

## 2013-12-26 LAB — POC OCCULT BLOOD, ED: FECAL OCCULT BLD: POSITIVE — AB

## 2013-12-26 MED ORDER — VITAMIN C 500 MG PO TABS
500.0000 mg | ORAL_TABLET | Freq: Two times a day (BID) | ORAL | Status: DC
  Administered 2013-12-26 – 2014-01-03 (×16): 500 mg via ORAL
  Filled 2013-12-26 (×17): qty 1

## 2013-12-26 MED ORDER — PHENYTOIN SODIUM EXTENDED 100 MG PO CAPS
100.0000 mg | ORAL_CAPSULE | Freq: Three times a day (TID) | ORAL | Status: DC
  Administered 2013-12-26 – 2014-01-03 (×23): 100 mg via ORAL
  Filled 2013-12-26 (×26): qty 1

## 2013-12-26 MED ORDER — AMIODARONE HCL 200 MG PO TABS
200.0000 mg | ORAL_TABLET | Freq: Every evening | ORAL | Status: DC
  Administered 2013-12-26 – 2014-01-02 (×8): 200 mg via ORAL
  Filled 2013-12-26 (×9): qty 1

## 2013-12-26 MED ORDER — CALCIUM POLYCARBOPHIL 625 MG PO TABS
625.0000 mg | ORAL_TABLET | Freq: Every day | ORAL | Status: DC
  Administered 2013-12-27 – 2014-01-03 (×8): 625 mg via ORAL
  Filled 2013-12-26 (×8): qty 1

## 2013-12-26 MED ORDER — GABAPENTIN 300 MG PO CAPS
300.0000 mg | ORAL_CAPSULE | Freq: Every day | ORAL | Status: DC
  Administered 2013-12-26 – 2014-01-02 (×8): 300 mg via ORAL
  Filled 2013-12-26 (×9): qty 1

## 2013-12-26 MED ORDER — SODIUM CHLORIDE 0.9 % IJ SOLN
3.0000 mL | Freq: Two times a day (BID) | INTRAMUSCULAR | Status: DC
  Administered 2013-12-27 – 2014-01-03 (×16): 3 mL via INTRAVENOUS

## 2013-12-26 MED ORDER — ROSUVASTATIN CALCIUM 5 MG PO TABS
5.0000 mg | ORAL_TABLET | ORAL | Status: DC
  Administered 2013-12-27: 5 mg via ORAL
  Filled 2013-12-26: qty 1

## 2013-12-26 MED ORDER — CARVEDILOL 3.125 MG PO TABS: 3.1250 mg | ORAL_TABLET | ORAL | Status: DC

## 2013-12-26 MED ORDER — RENA-VITE PO TABS
1.0000 | ORAL_TABLET | Freq: Every day | ORAL | Status: DC
  Administered 2013-12-27 – 2014-01-03 (×8): 1 via ORAL
  Filled 2013-12-26 (×8): qty 1

## 2013-12-26 MED ORDER — MORPHINE SULFATE 2 MG/ML IJ SOLN: 1.0000 mg | INTRAMUSCULAR | Status: DC | PRN

## 2013-12-26 MED ORDER — IOHEXOL 300 MG/ML  SOLN
100.0000 mL | Freq: Once | INTRAMUSCULAR | Status: AC | PRN
  Administered 2013-12-26: 100 mL via INTRAVENOUS

## 2013-12-26 MED ORDER — HEPARIN SODIUM (PORCINE) 5000 UNIT/ML IJ SOLN
5000.0000 [IU] | Freq: Three times a day (TID) | INTRAMUSCULAR | Status: DC
  Administered 2013-12-27 – 2013-12-28 (×4): 5000 [IU] via SUBCUTANEOUS
  Filled 2013-12-26 (×7): qty 1

## 2013-12-26 MED ORDER — LOPERAMIDE HCL 2 MG PO CAPS: 2.0000 mg | ORAL_CAPSULE | Freq: Two times a day (BID) | ORAL | Status: DC | PRN

## 2013-12-26 MED ORDER — LANTHANUM CARBONATE 500 MG PO CHEW
1000.0000 mg | CHEWABLE_TABLET | Freq: Three times a day (TID) | ORAL | Status: DC
  Administered 2013-12-28 – 2013-12-30 (×6): 1000 mg via ORAL
  Filled 2013-12-26 (×13): qty 2

## 2013-12-26 MED ORDER — TACROLIMUS 1 MG PO CAPS
2.0000 mg | ORAL_CAPSULE | Freq: Two times a day (BID) | ORAL | Status: DC
  Administered 2013-12-26 – 2014-01-03 (×16): 2 mg via ORAL
  Filled 2013-12-26 (×17): qty 2

## 2013-12-26 MED ORDER — LEVOTHYROXINE SODIUM 25 MCG PO TABS
225.0000 ug | ORAL_TABLET | Freq: Every day | ORAL | Status: DC
  Administered 2013-12-27 – 2013-12-30 (×4): 225 ug via ORAL
  Filled 2013-12-26 (×5): qty 1

## 2013-12-26 MED ORDER — DEXTROSE 50 % IV SOLN: 25.0000 mL | INTRAVENOUS | Status: DC | PRN

## 2013-12-26 MED ORDER — MORPHINE SULFATE 4 MG/ML IJ SOLN
6.0000 mg | Freq: Once | INTRAMUSCULAR | Status: AC
  Administered 2013-12-26: 6 mg via INTRAVENOUS
  Filled 2013-12-26: qty 2

## 2013-12-26 MED ORDER — AZATHIOPRINE 50 MG PO TABS
75.0000 mg | ORAL_TABLET | Freq: Every day | ORAL | Status: DC
  Administered 2013-12-27 – 2014-01-03 (×9): 75 mg via ORAL
  Filled 2013-12-26 (×10): qty 2

## 2013-12-26 MED ORDER — ACETAMINOPHEN 325 MG PO TABS: 650.0000 mg | ORAL_TABLET | Freq: Three times a day (TID) | ORAL | Status: DC | PRN

## 2013-12-26 MED ORDER — CARVEDILOL 3.125 MG PO TABS
3.1250 mg | ORAL_TABLET | ORAL | Status: DC
  Filled 2013-12-26: qty 1

## 2013-12-26 MED ORDER — LUTEIN 20 MG PO TABS: 1.0000 | ORAL_TABLET | Freq: Every evening | ORAL | Status: DC

## 2013-12-26 MED ORDER — MORPHINE SULFATE ER 15 MG PO TBCR
15.0000 mg | EXTENDED_RELEASE_TABLET | Freq: Two times a day (BID) | ORAL | Status: DC
  Administered 2013-12-27: 15 mg via ORAL
  Filled 2013-12-26 (×2): qty 1

## 2013-12-26 MED ORDER — SODIUM CHLORIDE 0.9 % IV SOLN
INTRAVENOUS | Status: DC
  Administered 2013-12-27: 1000 mL via INTRAVENOUS

## 2013-12-26 MED ORDER — LATANOPROST 0.005 % OP SOLN
1.0000 [drp] | Freq: Every day | OPHTHALMIC | Status: DC
  Administered 2013-12-27 – 2014-01-02 (×8): 1 [drp] via OPHTHALMIC
  Filled 2013-12-26: qty 2.5

## 2013-12-26 MED ORDER — NITROGLYCERIN 0.4 MG SL SUBL: 0.4000 mg | SUBLINGUAL_TABLET | SUBLINGUAL | Status: DC | PRN

## 2013-12-26 NOTE — H&P (Signed)
[facility] History and Physical  [name] ZCH:[id number] DOB: 01-25-1961 DOA: 12/26/2013  Referring physician: ED physician PCP: [name]  Specialists:    Chief Complaint: back pain.  HPI: [name] is a 53 y.o. 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  Patient reports that his back pain started  6 weeks ago. It is located over the right side of her upper back, then it involved left upper back. He was prescribed with muscle relaxant by his renal doctor, which did not help. His back pain has been progressively getting worse. It started radiating to the R arm. 11 days ago patient was evaluated by orthopedic surgeon in [place]. He was treated with tapering dose steroids which also did not help. On 10/22, patient started having severe weakness over his left leg (he is s/p of R AKA). He could not walk anymore. Therefore he went back to his orthopedic surgeon again who planned to do a CT scan, but has not been done yet. Because of worsening symptoms, patient comes to hospital for further evaluation and treatment. Patient denies fever, chills, shortness of breath, chest pain, abdominal pain, diarrhea.   A CT scan was performed as the MRI could not be performed because the patient has had a pacemaker. The CT scan demonstrates a paraspinous mass with involvement of the T1-T2 and T3 vertebrae. There is no clear evidence of any epidural tumor but there does appear to be pathologic involvement of the endplates of O8-N8. The patient also has some lesions in his liver and it appears that this is a new diagnosis of cancer. Patient is admitted to stepdown for further evaluation and treatment.  Review of Systems: As presented in the history of presenting illness, rest negative.  Where does patient  live? Lives with her wife at home  Can patient participate in ADLs? partially  Allergy:  Allergies  Allergen Reactions  . Ceftriaxone Sodium Nausea And Vomiting  . Dilaudid [Hydromorphone Hcl]     Sensitivity--respiratory distress  . Protamine     Significant hypotension  . Rocephin [Ceftriaxone Sodium In Dextrose] Nausea And Vomiting    Rocephin injection with xylocaine  . Avelox [Moxifloxacin Hcl In Nacl] Rash    Ask patient to clarify type of medication. Also need to know type/severity/reaction.  . Ciprofloxacin Rash  . Moxifloxacin Rash    Past Medical History  Diagnosis Date  . Secondary hyperparathyroidism, renal     In setting of ESRD.  [name] Hypothyroidism   . Automatic implantable cardiac defibrillator in situ     CRT-ICD   . Hypertension   . Nonischemic cardiomyopathy     2D-echocardiogram (08/2007) - LV EF 45%, akinesis of inferoseptal wall, inferior wall,   . ESRD on hemodialysis Started age 36yo    on MWF schedule. Previous failed renal transplant 1998 at [facility] (with rejection in 1998). 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 Resumed HD 10/2006.   [name] DM type 1 (diabetes mellitus, type 1) DX: age 54yo    previously well controlled with Aic 5 (12/2008)  . Intracranial hemorrhage     History of. On prophylactic dilantin.  . 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  . Hyperlipidemia   . Hemopericardium     History of in  setting of failed radiogrequency ablation for Vtach  . Anemia of chronic disease     BL Hgb 11-13. In setting of ESRD.   [name] Peptic ulcer disease     Unknown history, no records per [employer].   . Cholelithiasis     Noted at least since 01/2006. Asymptomatic.  . Erectile dysfunction   . Peripheral autonomic neuropathy due to diabetes mellitus   . GERD (gastroesophageal reflux disease)   . Glaucoma   . Squamous cell skin  cancer, finger     History of. Marginal resection in 05/2008 with recurrent infection/ lytic lesion involving distal phalanx  . Toe amputation status     Ischemic fourth and fifith toe left (03/2009), history of previous 1,2,3 toe  amputations.   [name] MRSA (methicillin resistant staph aureus) culture positive     Verify type - Per medical history form dated 02/13/10.  [name] Poor circulation     per medical history form dated 02/13/10.  . Ventricular tachycardia     appropriate ATP and Shocks 12/12  . RIATA LEAD     EXTERNALIZATION 12/12 (sk) // high RV threshold  . S/P BKA (below knee amputation)     right  . Arthritis   . Blood transfusion without reported diagnosis   . Heart murmur   . Coronary artery disease   . ICD (implantable cardiac defibrillator) in place   . Legally blind   . Arteriovenous graft for hemodialysis in place, primary 2009    left leg, had a previous graft in right arm that occulded  . Chronic cholecystitis with calculus s/p lap chole Feb 2014 03/11/2012    Past Surgical History  Procedure Laterality Date  . Cardiac defibrillator placement  11/05    St Jude  . Transplant pancreatic allograft  2002  . Toe amputation  03/2009    left 4th, 5th toes. Previous 1,2, 3 toe left.  . Kidney transplant  2002  . Above knee leg amputation  11/09/10    Right AKA  . Ablation      2006  . Cholecystectomy N/A 04/16/2012    Procedure: LAPAROSCOPIC CHOLECYSTECTOMY WITH INTRAOPERATIVE CHOLANGIOGRAM;  Surgeon: Adin Hector, MD;  Location: [facility];  Service: General;  Laterality: N/A;  . Coronary artery bypass graft    . Vascular surgery    . Eye surgery    . Insert / replace / remove pacemaker      Social History:  reports that he has never smoked. He has never used smokeless tobacco. He reports that he does not drink alcohol or use illicit drugs.  Family History:  Family History  Problem Relation Age of Onset  . Hypertension Brother   . Hypertension Sister   . Coronary  artery disease Mother     x 5 vessel graft in her 60s  . Hypertension Mother   . Prostate cancer Father   . Hypertension Father   . Cancer Father     Prostate  . Hypertension Brother      Prior to Admission medications   Medication Sig Start Date End Date Taking? Authorizing Provider  acetaminophen (TYLENOL) 500 MG tablet Take 1,000 mg by mouth every 6 (six) hours as needed for mild pain.   Yes Historical Provider, MD  amiodarone (PACERONE) 200 MG tablet Take 200 mg by mouth every evening.    Yes Historical Provider, MD  azaTHIOprine (IMURAN) 50 MG tablet Take 75 mg by mouth daily.  12/07/11  Yes Historical Provider, MD  bimatoprost (  LUMIGAN) 0.03 % ophthalmic solution Place 1 drop into both eyes at bedtime.    Yes Historical Provider, MD  Calcium Polycarbophil (EQUALACTIN PO) Take 2 tablets by mouth 2 (two) times daily.   Yes Historical Provider, MD  carvedilol (COREG) 3.125 MG tablet Take 3.125 mg by mouth See admin instructions. Takes after dialysis on Monday, Wednesday, Friday, and Saturday   Yes Historical Provider, MD  gabapentin (NEURONTIN) 100 MG capsule Take 300 mg by mouth at bedtime.  09/09/12  Yes Historical Provider, MD  HYDROcodone-acetaminophen (LORTAB) 10-500 MG per tablet Take 0.5 tablets by mouth 2 (two) times daily as needed for pain. 12/22/13  Yes Mercedes Strupp Camprubi-Soms, PA-C  lanthanum (FOSRENOL) 1000 MG chewable tablet Chew 1,000 mg by mouth 3 (three) times daily with meals. And with snacks   Yes Historical Provider, MD  levothyroxine (SYNTHROID, LEVOTHROID) 75 MCG tablet Take 225 mcg by mouth daily.  12/31/11  Yes Historical Provider, MD  loperamide (IMODIUM) 2 MG capsule Take 1 mg by mouth 2 (two) times daily as needed for diarrhea or loose stools.    Yes Historical Provider, MD  Lutein 20 MG TABS Take 1 tablet by mouth every evening.    Yes Historical Provider, MD  multivitamin (RENA-VIT) TABS tablet Take 1 tablet by mouth daily.   Yes Historical Provider, MD   nitroGLYCERIN (NITROSTAT) 0.4 MG SL tablet Place 0.4 mg under the tongue every 5 (five) minutes as needed for chest pain.   Yes Historical Provider, MD  nizatidine (AXID) 150 MG capsule Take 150 mg by mouth at bedtime.   Yes Historical Provider, MD  Omega-3 Fatty Acids (SALMON OIL-1000 PO) Take 2,000 Units by mouth 2 (two) times daily.   Yes Historical Provider, MD  phenytoin (DILANTIN) 100 MG ER capsule Take 100 mg by mouth every 8 (eight) hours.   Yes Historical Provider, MD  predniSONE (DELTASONE) 5 MG tablet Take 5 mg by mouth daily.    Yes Historical Provider, MD  rosuvastatin (CRESTOR) 5 MG tablet Take 5 mg by mouth every other day.    Yes Historical Provider, MD  tacrolimus (PROGRAF) 1 MG capsule Take 2 mg by mouth 2 (two) times daily.    Yes Historical Provider, MD  vitamin C (ASCORBIC ACID) 500 MG tablet Take 500 mg by mouth 2 (two) times daily.    Yes Historical Provider, MD    Physical Exam: Filed Vitals:   12/27/13 0000 12/27/13 0114 12/27/13 0500 12/27/13 0514  BP: 91/44 84/43  78/43  Pulse: 65 61  60  Temp:  98.9 F (37.2 C)  98.5 F (36.9 C)  TempSrc:  Axillary  Axillary  Resp: 19 17  16   Height:      Weight:   61.6 kg (135 lb 12.9 oz)   SpO2: 99% 97%  98%   General: Not in acute distress HEENT:       Eyes: PERRL, EOMI, no scleral icterus       ENT: No discharge from the ears and nose, no pharynx injection, no tonsillar enlargement.        Neck: No JVD, no bruit, no mass felt. Cardiac: S1/S2, RRR, No murmurs, gallops or rubs Pulm: Good air movement bilaterally. Clear to auscultation bilaterally. No rales, wheezing, rhonchi or rubs. Abd: Soft, nondistended, nontender, no rebound pain, no organomegaly, BS present Ext: trace leg edema. 2+DP/PT pulse bilaterally. Left third finger partial amputation, R AKA Musculoskeletal: No joint deformities, erythema, or stiffness, ROM full Skin: No rashes.  Neuro: Alert  and oriented X3, cranial nerves II-XII grossly intact, muscle  strength 5/5 in upper extreme ties, 0/5 in the left leg.  Psych: Patient is not psychotic, no suicidal or hemocidal ideation.  Labs on Admission:  Basic Metabolic Panel:  Recent Labs Lab 12/22/13 1904 12/26/13 1404 12/27/13 0305  NA 141 137 138  K 3.7 4.3 5.0  CL 95* 96 94*  CO2 34*  --  27  GLUCOSE 102* 90 91  BUN 14 41* 52*  CREATININE 3.14* 4.90* 5.91*  CALCIUM 9.4  --  9.2  MG 2.1  --   --    Liver Function Tests:  Recent Labs Lab 12/22/13 1904 12/27/13 0305  AST 15 203*  ALT 9 85*  ALKPHOS 182* 357*  BILITOT 0.3 0.4  PROT 7.4 6.7  ALBUMIN 2.8* 2.2*    Recent Labs Lab 12/22/13 1904  LIPASE 37   No results found for this basename: AMMONIA,  in the last 168 hours CBC:  Recent Labs Lab 12/22/13 1904 12/26/13 1348 12/26/13 1404 12/27/13 0305  WBC 6.6 12.6*  --  11.6*  NEUTROABS 5.0 10.5*  --   --   HGB 12.0* 10.9* 12.2* 10.0*  HCT 36.2* 33.3* 36.0* 31.0*  MCV 98.9 99.1  --  98.7  PLT 255 223  --  203   Cardiac Enzymes: No results found for this basename: CKTOTAL, CKMB, CKMBINDEX, TROPONINI,  in the last 168 hours  BNP (last 3 results) No results found for this basename: PROBNP,  in the last 8760 hours CBG: No results found for this basename: GLUCAP,  in the last 168 hours  Radiological Exams on Admission: Ct Chest W Contrast  12/26/2013   ADDENDUM REPORT: 12/26/2013 18:02  ADDENDUM: Addendum for further clarification.  The right paraspinal lesion demonstrates a significant soft tissue component which extends superiorly, adjacent to the right posterior spinous process at T1, where it measures approximately 2.5 x 3.3 cm (series 21/image 4).  Inferiorly/posteriorly, the tumor extends along the posterior spinous process at T3 (series 201/image 10), with suspected pathologic fracture (series 223/image 67).  The differential for this lesion includes malignant nerve sheath tumors given the general location. However, the unusual appearance, as well as the  liver lesions, raise the possibility that this is an osseous/extraosseous metastasis from an unknown primary.  The portion of the lesion which is adjacent to the posterior spinous processes at T1 or T3 may provide a convenient location for percutaneous sampling.   Electronically Signed   By: Julian Hy M.D.   On: 12/26/2013 18:02   12/26/2013   CLINICAL DATA:  Chest pain. Evaluate for intra-abdominal abscess or vascular issue. Evaluate for epidural abscess, discitis, or cord compression. History of right above knee amputation, CAD, kidney transplant. Now with ESRD on dialysis.  EXAM: CT CHEST, ABDOMEN, AND PELVIS WITH CONTRAST  TECHNIQUE: Multidetector CT imaging of the chest, abdomen and pelvis was performed following the standard protocol during bolus administration of intravenous contrast.  CONTRAST:  14mL OMNIPAQUE IOHEXOL 300 MG/ML  SOLN  COMPARISON:  [place] CT abdomen dated 11/12/2011. [place] CTA chest dated 06/18/2011. [facility] CT abdomen pelvis dated 12/03/2008.  FINDINGS: CT CHEST FINDINGS  Mildly dependent atelectasis in the lung bases. Trace bilateral pleural effusions. No suspicious pulmonary nodules. Mild emphysematous changes. No pneumothorax.  Visualized thyroid is heterogeneous.  Cardiomegaly. No pericardial effusion. Coronary atherosclerosis. Atherosclerotic calcifications of the aortic arch. Right subclavian ICD.  Small mediastinal lymph nodes which do not meet both CT size criteria.  Mild  wall thickening involving the mid/ distal esophagus, nonspecific.  Right paraspinal soft tissue mass, measuring at least 1.9 x 3.3 x 1.9 cm (series 201/images 9-12), with associated destruction/pathologic fracture of the right T2 transverse process (series 222/image 63). Lesion is posterior to the right lung apex but favored to be extrapleural in location. Additional abnormal soft tissue likely extends posterior to the right T2 transverse process (series 21/image 7).  Degenerative changes of the  thoracic spine. Multiple old left rib fracture deformities.  CT ABDOMEN AND PELVIS FINDINGS  Hepatobiliary: 3.6 x 6.0 cm peripherally enhancing lesion in the medial segment left hepatic lobe (series 21/image 58), new. 3.9 x 4.6 cm peripherally enhancing low-density lesion in the posterior segment right hepatic lobe (series 21/image 26), new. Given the perispinal finding, these are suspicious for necrotic metastases. Abscesses are also possible.  Background heterogeneous perfusion of the liver.  Status post cholecystectomy. No intrahepatic or extrahepatic ductal dilatation.  Pancreas: Parenchymal atrophy.  Spleen: Within normal limits.  Adrenals/Urinary Tract: Adrenal glands are unremarkable.  Status post right nephrectomy. Native left renal atrophy with multiple probable acquired renal cysts. Transplant kidney in the left lower quadrant with additional small cysts. No hydronephrosis.  Bladder is thick-walled although underdistended.  Stomach/Bowel: Stomach is unremarkable.  No evidence of bowel obstruction.  Extensive colonic diverticulosis, without associated inflammatory changes.  Vascular/Lymphatic: Atherosclerotic calcifications of the abdominal aorta and branch vessels.  No suspicious abdominopelvic lymphadenopathy.  Reproductive: Prostate is grossly unremarkable.  Other: No abdominopelvic ascites.  Musculoskeletal: Degenerative changes of the lumbar spine.  IMPRESSION: Right paraspinal soft tissue mass, measuring at least 3.3 cm, worrisome for primary or metastatic malignancy. Associated osseous destruction/pathologic fracture of the right T2 transverse process.  Two hepatic lesions measuring up to 6.0 cm, worrisome for metastases, less likely abscesses.  Additional ancillary findings as above.  Electronically Signed: By: Julian Hy M.D. On: 12/26/2013 17:44   Ct Thoracic Spine W Contrast  12/26/2013   CLINICAL DATA:  Back pain. Evaluate for epidural abscess, discitis, or cord compression.  EXAM: CT  THORACIC SPINE WITH CONTRAST  TECHNIQUE: Multidetector CT imaging of the thoracic spine was performed using the standard protocol following bolus administration of intravenous contrast. Multiplanar CT image reconstructions were also generated.  CONTRAST:  110mL OMNIPAQUE IOHEXOL 300 MG/ML  SOLN  COMPARISON:  None.  FINDINGS: Right paraspinal tumor is present, centered along the right T2 transverse process, with associated pathologic fracture/osseous destruction (series 206/image 19).  Tumor extends superiorly adjacent to the right T1 posterior spinous process (series 207/image 1), where measures approximately 2.7 x 3.2 cm.  Tumor extends laterally/anteriorly along the posterior right lung apex (series 207/image 13-20), where it measures approximately 1.9 x 3.5 cm.  Tumor extends posteriorly cyst inferiorly along the T3 posterior spinous process (series 207/image 10), where measures approximately 3.7 x 4.3 cm, with associated pathologic fracture (series 206/images 21-22).  Although difficult to definitively state on CT, there is no convincing tumor extension into the spinal canal (series 210/image 27).  This appearance favors osseous/extraosseous metastases, less likely malignant nerve sheath tumor.  Degenerative changes of the lower cervical spine.  Exaggerated thoracic kyphosis.  Mild multilevel degenerative changes of the thoracic spine. Vertebral body heights are maintained.  Spinal canal remains patent.  Renal osteodystrophy.  IMPRESSION: Right paraspinal tumor along T1-3, as described above. This appearance favors osseous/extraosseous metastases, less likely malignant nerve sheath tumor.  Associated osseous destruction with pathologic fracture involving the right T2 transverse process and T3 posterior spinous process.  No  definite extension into the spinal canal.   Electronically Signed   By: Julian Hy M.D.   On: 12/26/2013 18:13   Ct Lumbar Spine W Contrast  12/26/2013   CLINICAL DATA:  Back pain,  weakness. Evaluate for epidural abscess, discitis, or cord compression.  EXAM: CT LUMBAR SPINE WITH CONTRAST  TECHNIQUE: Multidetector CT imaging of the lumbar spine was performed with intravenous contrast administration. Multiplanar CT image reconstructions were also generated.  CONTRAST:  150mL OMNIPAQUE IOHEXOL 300 MG/ML  SOLN  COMPARISON:  None.  FINDINGS: Five lumbar type vertebral bodies.  Normal lumbar lordosis.  No evidence of fracture or dislocation. Vertebral body heights are maintained.  Mild multilevel degenerative changes.  Spinal canal is widely patent.  Small posterior disc bulges at L3-4, L4-5, and L5-S1 (series 217/ image 41).  Mild lateral recess stenosis with facet arthropathy at L4-5 and L5-S1.  No evidence of epidural abscess or discitis osteomyelitis.  Liver lesion in the posterior segment right hepatic lobe (series 227/image 48), better visualized on CT abdomen/pelvis.  IMPRESSION: Small posterior disc bulges at L3-4, L4-5, and L5-S1.  Spinal canal remains patent.  No evidence of epidural abscess or discitis osteomyelitis.   Electronically Signed   By: Julian Hy M.D.   On: 12/26/2013 18:21   Ct Abdomen Pelvis W Contrast  12/26/2013   ADDENDUM REPORT: 12/26/2013 18:02  ADDENDUM: Addendum for further clarification.  The right paraspinal lesion demonstrates a significant soft tissue component which extends superiorly, adjacent to the right posterior spinous process at T1, where it measures approximately 2.5 x 3.3 cm (series 21/image 4).  Inferiorly/posteriorly, the tumor extends along the posterior spinous process at T3 (series 201/image 10), with suspected pathologic fracture (series 223/image 67).  The differential for this lesion includes malignant nerve sheath tumors given the general location. However, the unusual appearance, as well as the liver lesions, raise the possibility that this is an osseous/extraosseous metastasis from an unknown primary.  The portion of the lesion  which is adjacent to the posterior spinous processes at T1 or T3 may provide a convenient location for percutaneous sampling.   Electronically Signed   By: Julian Hy M.D.   On: 12/26/2013 18:02   12/26/2013   CLINICAL DATA:  Chest pain. Evaluate for intra-abdominal abscess or vascular issue. Evaluate for epidural abscess, discitis, or cord compression. History of right above knee amputation, CAD, kidney transplant. Now with ESRD on dialysis.  EXAM: CT CHEST, ABDOMEN, AND PELVIS WITH CONTRAST  TECHNIQUE: Multidetector CT imaging of the chest, abdomen and pelvis was performed following the standard protocol during bolus administration of intravenous contrast.  CONTRAST:  144mL OMNIPAQUE IOHEXOL 300 MG/ML  SOLN  COMPARISON:  [place] CT abdomen dated 11/12/2011. [place] CTA chest dated 06/18/2011. [facility] CT abdomen pelvis dated 12/03/2008.  FINDINGS: CT CHEST FINDINGS  Mildly dependent atelectasis in the lung bases. Trace bilateral pleural effusions. No suspicious pulmonary nodules. Mild emphysematous changes. No pneumothorax.  Visualized thyroid is heterogeneous.  Cardiomegaly. No pericardial effusion. Coronary atherosclerosis. Atherosclerotic calcifications of the aortic arch. Right subclavian ICD.  Small mediastinal lymph nodes which do not meet both CT size criteria.  Mild wall thickening involving the mid/ distal esophagus, nonspecific.  Right paraspinal soft tissue mass, measuring at least 1.9 x 3.3 x 1.9 cm (series 201/images 9-12), with associated destruction/pathologic fracture of the right T2 transverse process (series 222/image 63). Lesion is posterior to the right lung apex but favored to be extrapleural in location. Additional abnormal soft tissue likely extends  posterior to the right T2 transverse process (series 21/image 7).  Degenerative changes of the thoracic spine. Multiple old left rib fracture deformities.  CT ABDOMEN AND PELVIS FINDINGS  Hepatobiliary: 3.6 x 6.0 cm peripherally  enhancing lesion in the medial segment left hepatic lobe (series 21/image 58), new. 3.9 x 4.6 cm peripherally enhancing low-density lesion in the posterior segment right hepatic lobe (series 21/image 26), new. Given the perispinal finding, these are suspicious for necrotic metastases. Abscesses are also possible.  Background heterogeneous perfusion of the liver.  Status post cholecystectomy. No intrahepatic or extrahepatic ductal dilatation.  Pancreas: Parenchymal atrophy.  Spleen: Within normal limits.  Adrenals/Urinary Tract: Adrenal glands are unremarkable.  Status post right nephrectomy. Native left renal atrophy with multiple probable acquired renal cysts. Transplant kidney in the left lower quadrant with additional small cysts. No hydronephrosis.  Bladder is thick-walled although underdistended.  Stomach/Bowel: Stomach is unremarkable.  No evidence of bowel obstruction.  Extensive colonic diverticulosis, without associated inflammatory changes.  Vascular/Lymphatic: Atherosclerotic calcifications of the abdominal aorta and branch vessels.  No suspicious abdominopelvic lymphadenopathy.  Reproductive: Prostate is grossly unremarkable.  Other: No abdominopelvic ascites.  Musculoskeletal: Degenerative changes of the lumbar spine.  IMPRESSION: Right paraspinal soft tissue mass, measuring at least 3.3 cm, worrisome for primary or metastatic malignancy. Associated osseous destruction/pathologic fracture of the right T2 transverse process.  Two hepatic lesions measuring up to 6.0 cm, worrisome for metastases, less likely abscesses.  Additional ancillary findings as above.  Electronically Signed: By: Julian Hy M.D. On: 12/26/2013 17:44    EKG: Independently reviewed.   Assessment/Plan Principal Problem:   Mass of thoracic vertebra Active Problems:   Hypothyroidism   Essential hypertension   Chronic systolic heart failure   End stage renal disease   Automatic implantable cardioverter-defibrillator in  situ   S/P Right AKA (above knee amputation)   H/O pancreas/kidney transplant 2002 [facility]   GERD (gastroesophageal reflux disease)  1. Weakness over leg: Complete paralysis of lower extremities. The patient has lesions at T1-T2 and T3 area with epidural and paraspinous tumor. Neurosurgeon was consulted, Dr. Ellene Route evaluated the patient and plan to ask interventional radiology  - will admit to SDU - start solucortif 50 mg q6h - pain control: MS contin and morphine prn.  - check INR - may need to call IR to do a needle biopsy of the soft tissue mass in the spine or his liver which ever they feel is more easily accessible per Dr. Ellene Route.   2. 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 CHF: 2-D echo 1/14/9 showed EF of 45%. On admission patient is euvolemic.  -will get HD today  4. ESRD-HD: Dialysis schedule on Monday/Wednesday/Friday - left message to renal   5. Hx of pancreas/kidney transplantation: Volume immunosuppressant at home. - continue as azathioprine, tacrolimus  6. Hypothyroidism: last TSH was 1.02 on 03/03/11 - will hold Synthroid - check TSH  DVT ppx: SQ Heparin     Code Status: DNRe Family Communication: Yes, patient's wife at bed side Disposition Plan: Admit to inpatient   Date of Service 12/27/2013    Ivor Costa [facility] Pager [phone]  If 7PM-7AM, please contact night-coverage [email or web address] Password [facility] 12/27/2013, 7:33 AM

## 2013-12-26 NOTE — ED Notes (Signed)
Per EMS pt was here Wednesday for not being able to feel his lower extremities. Pt today is complaining of weakness and neck pain. The weakness per pt started Thursday and now pt doesn't have any feeling in his lower extremities, pt states he is numb all the way up into his stomach. 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

## 2013-12-26 NOTE — ED Notes (Signed)
Pt in CT.

## 2013-12-26 NOTE — ED Notes (Signed)
Pt complaining of pain in arms, shoulders, and neck. Described as tingling/burning.

## 2013-12-26 NOTE — ED Notes (Addendum)
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 Pt has not been able to move his legs at all since Thursday. Pt cannot move his legs during assessment. 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 Pt states he is numb all the way up to his lower chest since Thursday. Pt is alert/oriented x4. Pt states he has not been sick within the last month. Pt states he did have new onset fever this morning 100.4 and took some tylenol and now temp is 98.4. 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

## 2013-12-26 NOTE — ED Provider Notes (Signed)
CSN: [id number]     Arrival date & time 12/26/13  1153 History   First MD Initiated Contact with Patient 12/26/13 1206     Chief Complaint  Patient presents with  . Fatigue     (Consider location/radiation/quality/duration/timing/severity/associated sxs/prior Treatment) HPI Mr. Mejorado is a is a 53 y.o. male with a PMHx of ESRD on dialysis M/W/F, secondary hyperparathyroidism, hypothyroidism, HTN, NICM s/p cardiac defibrillator implantation, DM1, ICH, HLD, anemia, PUD, cholelithiasis, ED, neuropathy, GERD, glaucoma, legal blindness, SCC finger, multiple amputations including R AKA and L 3rd digit, PVD, VTach, arthritis, heart murmur, CAD, and PSHx of pancreatic and kidney transplant, who presents to the ED complaining of mid thoracic back pain, neck pain, fever. Patient states his back pain has been present for approximately 6 weeks, and he has been treated with over-the-counter pain relief and Flexeril. Patient states his back. Has since not improved, and now has radiated into his neck. Patient states he also has a pain in his hips, and feels as though his "backbone is rubbing against another bone". Patient stating he is also noted a increased weakness in his legs bilaterally. Patient states he uses a wheelchair to get around his house, and he has very limited function of his legs to begin with. Patient reports having a generalized numbness from his chest down to his legs including his left foot. Patient has right-sided AKA.    Past Medical History  Diagnosis Date  . Secondary hyperparathyroidism, renal     In setting of ESRD.  [name] Hypothyroidism   . Automatic implantable cardiac defibrillator in situ     CRT-ICD   . Hypertension   . Nonischemic cardiomyopathy     2D-echocardiogram (08/2007) - LV EF 45%, akinesis of inferoseptal wall, inferior wall,   . ESRD on hemodialysis Started age 21yo    on MWF schedule. Previous failed renal transplant 1998 at [facility] (with rejection in 1998). 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 Resumed HD 10/2006.   [name] DM type 1 (diabetes mellitus, type 1) DX: age 28yo    previously well controlled with Aic 5 (12/2008)  . Intracranial hemorrhage     History of. On prophylactic dilantin.  . 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  . Hyperlipidemia   . Hemopericardium     History of in setting of failed radiogrequency ablation for Vtach  . Anemia of chronic disease     BL Hgb 11-13. In setting of ESRD.   [name] Peptic ulcer disease     Unknown history, no records per [employer].   . Cholelithiasis     Noted at least since 01/2006. Asymptomatic.  . Erectile dysfunction   . Peripheral autonomic neuropathy due to diabetes mellitus   . GERD (gastroesophageal reflux disease)   . Glaucoma   . Squamous cell skin cancer, finger     History of. Marginal resection in 05/2008 with recurrent infection/ lytic lesion involving distal phalanx  . Toe amputation status     Ischemic fourth and fifith toe left (03/2009), history of previous 1,2,3 toe  amputations.   [name] MRSA (methicillin resistant staph aureus) culture positive     Verify type - Per medical history form dated 02/13/10.  [name] Poor circulation     per medical history form dated 02/13/10.  . Ventricular tachycardia     appropriate ATP and Shocks 12/12  . RIATA LEAD  EXTERNALIZATION 12/12 (sk) // high RV threshold  . S/P BKA (below knee amputation)     right  . Arthritis   . Blood transfusion without reported diagnosis   . Heart murmur   . Coronary artery disease   . ICD (implantable cardiac defibrillator) in place   . Legally blind   . Arteriovenous graft for hemodialysis in place, primary 2009    left leg, had a previous graft in right arm that occulded  . Chronic cholecystitis with calculus s/p lap chole Feb 2014 03/11/2012   Past Surgical History  Procedure  Laterality Date  . Cardiac defibrillator placement  11/05    [employer] Jude  . Transplant pancreatic allograft  2002  . Toe amputation  03/2009    left 4th, 5th toes. Previous 1,2, 3 toe left.  . Kidney transplant  2002  . Above knee leg amputation  11/09/10    Right AKA  . Ablation      2006  . Cholecystectomy N/A 04/16/2012    Procedure: LAPAROSCOPIC CHOLECYSTECTOMY WITH INTRAOPERATIVE CHOLANGIOGRAM;  Surgeon: Adin Hector, MD;  Location: [place];  Service: General;  Laterality: N/A;   Family History  Problem Relation Age of Onset  . Hypertension Brother   . Hypertension Sister   . Coronary artery disease Mother     x 5 vessel graft in her 64s  . Hypertension Mother   . Prostate cancer Father   . Hypertension Father   . Cancer Father     Prostate  . Hypertension Brother    History  Substance Use Topics  . Smoking status: Never Smoker   . Smokeless tobacco: Never Used  . Alcohol Use: No     Comment: previously drank heavily from 1980-1987    Review of Systems  Constitutional: Positive for fever.  HENT: Negative for trouble swallowing.   Eyes: Negative for visual disturbance.  Respiratory: Negative for shortness of breath.   Cardiovascular: Negative for chest pain.  Gastrointestinal: Negative for nausea, vomiting and abdominal pain.  Genitourinary: Negative for dysuria.  Musculoskeletal: Positive for back pain and neck pain.  Skin: Negative for rash.  Neurological: Positive for weakness and numbness. Negative for dizziness and syncope.  Psychiatric/Behavioral: Negative.       Allergies  Ceftriaxone sodium; Dilaudid; Protamine; Rocephin; Avelox; Ciprofloxacin; and Moxifloxacin  Home Medications   Prior to Admission medications   Medication Sig Start Date End Date Taking? Authorizing Provider  acetaminophen (TYLENOL) 500 MG tablet Take 1,000 mg by mouth every 6 (six) hours as needed for mild pain.   Yes Historical Provider, MD  amiodarone (PACERONE) 200 MG tablet  Take 200 mg by mouth every evening.    Yes Historical Provider, MD  azaTHIOprine (IMURAN) 50 MG tablet Take 75 mg by mouth daily.  12/07/11  Yes Historical Provider, MD  bimatoprost (LUMIGAN) 0.03 % ophthalmic solution Place 1 drop into both eyes at bedtime.    Yes Historical Provider, MD  Calcium Polycarbophil (EQUALACTIN PO) Take 2 tablets by mouth 2 (two) times daily.   Yes Historical Provider, MD  carvedilol (COREG) 3.125 MG tablet Take 3.125 mg by mouth See admin instructions. Takes after dialysis on Monday, Wednesday, Friday, and Saturday   Yes Historical Provider, MD  gabapentin (NEURONTIN) 100 MG capsule Take 300 mg by mouth at bedtime.  09/09/12  Yes Historical Provider, MD  HYDROcodone-acetaminophen (LORTAB) 10-500 MG per tablet Take 0.5 tablets by mouth 2 (two) times daily as needed for pain. 12/22/13  Yes Mercedes Strupp Camprubi-Soms,  PA-C  lanthanum (FOSRENOL) 1000 MG chewable tablet Chew 1,000 mg by mouth 3 (three) times daily with meals. And with snacks   Yes Historical Provider, MD  levothyroxine (SYNTHROID, LEVOTHROID) 75 MCG tablet Take 225 mcg by mouth daily.  12/31/11  Yes Historical Provider, MD  loperamide (IMODIUM) 2 MG capsule Take 1 mg by mouth 2 (two) times daily as needed for diarrhea or loose stools.    Yes Historical Provider, MD  Lutein 20 MG TABS Take 1 tablet by mouth every evening.    Yes Historical Provider, MD  multivitamin (RENA-VIT) TABS tablet Take 1 tablet by mouth daily.   Yes Historical Provider, MD  nitroGLYCERIN (NITROSTAT) 0.4 MG SL tablet Place 0.4 mg under the tongue every 5 (five) minutes as needed for chest pain.   Yes Historical Provider, MD  nizatidine (AXID) 150 MG capsule Take 150 mg by mouth at bedtime.   Yes Historical Provider, MD  Omega-3 Fatty Acids (SALMON OIL-1000 PO) Take 2,000 Units by mouth 2 (two) times daily.   Yes Historical Provider, MD  phenytoin (DILANTIN) 100 MG ER capsule Take 100 mg by mouth every 8 (eight) hours.   Yes Historical  Provider, MD  predniSONE (DELTASONE) 5 MG tablet Take 5 mg by mouth daily.    Yes Historical Provider, MD  rosuvastatin (CRESTOR) 5 MG tablet Take 5 mg by mouth every other day.    Yes Historical Provider, MD  tacrolimus (PROGRAF) 1 MG capsule Take 2 mg by mouth 2 (two) times daily.    Yes Historical Provider, MD  vitamin C (ASCORBIC ACID) 500 MG tablet Take 500 mg by mouth 2 (two) times daily.    Yes Historical Provider, MD   BP 97/51  Pulse 64  Temp(Src) 98.4 F (36.9 C) (Oral)  Resp 16  SpO2 96% Physical Exam  Nursing note and vitals reviewed. Constitutional: He is oriented to person, place, and time. He appears well-developed and well-nourished. No distress.  HENT:  Head: Normocephalic and atraumatic.  Mouth/Throat: Oropharynx is clear and moist. No oropharyngeal exudate.  Eyes: Right eye exhibits no discharge. Left eye exhibits no discharge. No scleral icterus.  Neck: Normal range of motion.  Cardiovascular: Normal rate, regular rhythm and S2 normal.   Murmur heard.  Systolic murmur is present with a grade of 3/6  Pulses:      Radial pulses are 2+ on the right side, and 2+ on the left side.       Dorsalis pedis pulses are 0 on the left side. Right dorsalis pedis pulse not accessible.  No palpable pulse patient's left DP. Pulse confirmed with Doppler.  Pulmonary/Chest: Effort normal and breath sounds normal. No accessory muscle usage. Not tachypneic. No respiratory distress.  Abdominal: Soft. There is no tenderness.  Genitourinary: Rectal exam shows external hemorrhoid and anal tone abnormal. Guaiac positive stool.  Poor rectal tone noted on exam. Moderate amount of light brown colored stool noted around rectum and gluteal cleft. No frank blood noted on exam, external hemorrhoid noted at approximately 8:00 position of rectum. Chaperone present during entire rectal exam.  Musculoskeletal: Normal range of motion. He exhibits no edema and no tenderness.       Thoracic back: He  exhibits normal range of motion.       Back:  No palpable tenderness and spinous processes, however patient reports being numb from the L3-L4 level distally. With range of motion patient reports a "grinding bone on bone sensation" in his lumbar region.   Neurological: He  is alert and oriented to person, place, and time. No cranial nerve deficit. Coordination normal. GCS eye subscore is 4. GCS verbal subscore is 5. GCS motor subscore is 6.  Patient fully alert and answering questions appropriately in full, clear sentences. Patient unable to lift his legs off the bed. Motor strength 2 out of 5 muscle groups of femoral region. Patient AKA on the right leg. On left foot, patient has multiple amputations to toes, and decreased sensation of foot, distal tibial region.  Skin: Skin is warm and dry. No rash noted. He is not diaphoretic.  Psychiatric: He has a normal mood and affect.    ED Course  Procedures (including critical care time) Labs Review Labs Reviewed  CBC WITH DIFFERENTIAL - Abnormal; Notable for the following:    WBC 12.6 (*)    RBC 3.36 (*)    Hemoglobin 10.9 (*)    HCT 33.3 (*)    RDW 16.5 (*)    Neutrophils Relative % 84 (*)    Neutro Abs 10.5 (*)    Lymphocytes Relative 5 (*)    Monocytes Absolute 1.3 (*)    All other components within normal limits  I-STAT CHEM 8, ED - Abnormal; Notable for the following:    BUN 41 (*)    Creatinine, Ser 4.90 (*)    Hemoglobin 12.2 (*)    HCT 36.0 (*)    All other components within normal limits  POC OCCULT BLOOD, ED - Abnormal; Notable for the following:    Fecal Occult Bld POSITIVE (*)    All other components within normal limits  OCCULT BLOOD X 1 CARD TO LAB, STOOL    Imaging Review Ct Chest W Contrast  12/26/2013   ADDENDUM REPORT: 12/26/2013 18:02  ADDENDUM: Addendum for further clarification.  The right paraspinal lesion demonstrates a significant soft tissue component which extends superiorly, adjacent to the right posterior  spinous process at T1, where it measures approximately 2.5 x 3.3 cm (series 21/image 4).  Inferiorly/posteriorly, the tumor extends along the posterior spinous process at T3 (series 201/image 10), with suspected pathologic fracture (series 223/image 67).  The differential for this lesion includes malignant nerve sheath tumors given the general location. However, the unusual appearance, as well as the liver lesions, raise the possibility that this is an osseous/extraosseous metastasis from an unknown primary.  The portion of the lesion which is adjacent to the posterior spinous processes at T1 or T3 may provide a convenient location for percutaneous sampling.   Electronically Signed   By: Julian Hy M.D.   On: 12/26/2013 18:02   12/26/2013   CLINICAL DATA:  Chest pain. Evaluate for intra-abdominal abscess or vascular issue. Evaluate for epidural abscess, discitis, or cord compression. History of right above knee amputation, CAD, kidney transplant. Now with ESRD on dialysis.  EXAM: CT CHEST, ABDOMEN, AND PELVIS WITH CONTRAST  TECHNIQUE: Multidetector CT imaging of the chest, abdomen and pelvis was performed following the standard protocol during bolus administration of intravenous contrast.  CONTRAST:  188mL OMNIPAQUE IOHEXOL 300 MG/ML  SOLN  COMPARISON:  [place] CT abdomen dated 11/12/2011. [place] CTA chest dated 06/18/2011. [facility] CT abdomen pelvis dated 12/03/2008.  FINDINGS: CT CHEST FINDINGS  Mildly dependent atelectasis in the lung bases. Trace bilateral pleural effusions. No suspicious pulmonary nodules. Mild emphysematous changes. No pneumothorax.  Visualized thyroid is heterogeneous.  Cardiomegaly. No pericardial effusion. Coronary atherosclerosis. Atherosclerotic calcifications of the aortic arch. Right subclavian ICD.  Small mediastinal lymph nodes which do not meet  both CT size criteria.  Mild wall thickening involving the mid/ distal esophagus, nonspecific.  Right paraspinal soft tissue  mass, measuring at least 1.9 x 3.3 x 1.9 cm (series 201/images 9-12), with associated destruction/pathologic fracture of the right T2 transverse process (series 222/image 63). Lesion is posterior to the right lung apex but favored to be extrapleural in location. Additional abnormal soft tissue likely extends posterior to the right T2 transverse process (series 21/image 7).  Degenerative changes of the thoracic spine. Multiple old left rib fracture deformities.  CT ABDOMEN AND PELVIS FINDINGS  Hepatobiliary: 3.6 x 6.0 cm peripherally enhancing lesion in the medial segment left hepatic lobe (series 21/image 58), new. 3.9 x 4.6 cm peripherally enhancing low-density lesion in the posterior segment right hepatic lobe (series 21/image 26), new. Given the perispinal finding, these are suspicious for necrotic metastases. Abscesses are also possible.  Background heterogeneous perfusion of the liver.  Status post cholecystectomy. No intrahepatic or extrahepatic ductal dilatation.  Pancreas: Parenchymal atrophy.  Spleen: Within normal limits.  Adrenals/Urinary Tract: Adrenal glands are unremarkable.  Status post right nephrectomy. Native left renal atrophy with multiple probable acquired renal cysts. Transplant kidney in the left lower quadrant with additional small cysts. No hydronephrosis.  Bladder is thick-walled although underdistended.  Stomach/Bowel: Stomach is unremarkable.  No evidence of bowel obstruction.  Extensive colonic diverticulosis, without associated inflammatory changes.  Vascular/Lymphatic: Atherosclerotic calcifications of the abdominal aorta and branch vessels.  No suspicious abdominopelvic lymphadenopathy.  Reproductive: Prostate is grossly unremarkable.  Other: No abdominopelvic ascites.  Musculoskeletal: Degenerative changes of the lumbar spine.  IMPRESSION: Right paraspinal soft tissue mass, measuring at least 3.3 cm, worrisome for primary or metastatic malignancy. Associated osseous  destruction/pathologic fracture of the right T2 transverse process.  Two hepatic lesions measuring up to 6.0 cm, worrisome for metastases, less likely abscesses.  Additional ancillary findings as above.  Electronically Signed: By: Julian Hy M.D. On: 12/26/2013 17:44   Ct Thoracic Spine W Contrast  12/26/2013   CLINICAL DATA:  Back pain. Evaluate for epidural abscess, discitis, or cord compression.  EXAM: CT THORACIC SPINE WITH CONTRAST  TECHNIQUE: Multidetector CT imaging of the thoracic spine was performed using the standard protocol following bolus administration of intravenous contrast. Multiplanar CT image reconstructions were also generated.  CONTRAST:  138mL OMNIPAQUE IOHEXOL 300 MG/ML  SOLN  COMPARISON:  None.  FINDINGS: Right paraspinal tumor is present, centered along the right T2 transverse process, with associated pathologic fracture/osseous destruction (series 206/image 19).  Tumor extends superiorly adjacent to the right T1 posterior spinous process (series 207/image 1), where measures approximately 2.7 x 3.2 cm.  Tumor extends laterally/anteriorly along the posterior right lung apex (series 207/image 13-20), where it measures approximately 1.9 x 3.5 cm.  Tumor extends posteriorly cyst inferiorly along the T3 posterior spinous process (series 207/image 10), where measures approximately 3.7 x 4.3 cm, with associated pathologic fracture (series 206/images 21-22).  Although difficult to definitively state on CT, there is no convincing tumor extension into the spinal canal (series 210/image 27).  This appearance favors osseous/extraosseous metastases, less likely malignant nerve sheath tumor.  Degenerative changes of the lower cervical spine.  Exaggerated thoracic kyphosis.  Mild multilevel degenerative changes of the thoracic spine. Vertebral body heights are maintained.  Spinal canal remains patent.  Renal osteodystrophy.  IMPRESSION: Right paraspinal tumor along T1-3, as described above. This  appearance favors osseous/extraosseous metastases, less likely malignant nerve sheath tumor.  Associated osseous destruction with pathologic fracture involving the right T2 transverse process  and T3 posterior spinous process.  No definite extension into the spinal canal.   Electronically Signed   By: Julian Hy M.D.   On: 12/26/2013 18:13   Ct Abdomen Pelvis W Contrast  12/26/2013   ADDENDUM REPORT: 12/26/2013 18:02  ADDENDUM: Addendum for further clarification.  The right paraspinal lesion demonstrates a significant soft tissue component which extends superiorly, adjacent to the right posterior spinous process at T1, where it measures approximately 2.5 x 3.3 cm (series 21/image 4).  Inferiorly/posteriorly, the tumor extends along the posterior spinous process at T3 (series 201/image 10), with suspected pathologic fracture (series 223/image 67).  The differential for this lesion includes malignant nerve sheath tumors given the general location. However, the unusual appearance, as well as the liver lesions, raise the possibility that this is an osseous/extraosseous metastasis from an unknown primary.  The portion of the lesion which is adjacent to the posterior spinous processes at T1 or T3 may provide a convenient location for percutaneous sampling.   Electronically Signed   By: Julian Hy M.D.   On: 12/26/2013 18:02   12/26/2013   CLINICAL DATA:  Chest pain. Evaluate for intra-abdominal abscess or vascular issue. Evaluate for epidural abscess, discitis, or cord compression. History of right above knee amputation, CAD, kidney transplant. Now with ESRD on dialysis.  EXAM: CT CHEST, ABDOMEN, AND PELVIS WITH CONTRAST  TECHNIQUE: Multidetector CT imaging of the chest, abdomen and pelvis was performed following the standard protocol during bolus administration of intravenous contrast.  CONTRAST:  175mL OMNIPAQUE IOHEXOL 300 MG/ML  SOLN  COMPARISON:  [place] CT abdomen dated 11/12/2011.  CTA  chest dated 06/18/2011. [facility] CT abdomen pelvis dated 12/03/2008.  FINDINGS: CT CHEST FINDINGS  Mildly dependent atelectasis in the lung bases. Trace bilateral pleural effusions. No suspicious pulmonary nodules. Mild emphysematous changes. No pneumothorax.  Visualized thyroid is heterogeneous.  Cardiomegaly. No pericardial effusion. Coronary atherosclerosis. Atherosclerotic calcifications of the aortic arch. Right subclavian ICD.  Small mediastinal lymph nodes which do not meet both CT size criteria.  Mild wall thickening involving the mid/ distal esophagus, nonspecific.  Right paraspinal soft tissue mass, measuring at least 1.9 x 3.3 x 1.9 cm (series 201/images 9-12), with associated destruction/pathologic fracture of the right T2 transverse process (series 222/image 63). Lesion is posterior to the right lung apex but favored to be extrapleural in location. Additional abnormal soft tissue likely extends posterior to the right T2 transverse process (series 21/image 7).  Degenerative changes of the thoracic spine. Multiple old left rib fracture deformities.  CT ABDOMEN AND PELVIS FINDINGS  Hepatobiliary: 3.6 x 6.0 cm peripherally enhancing lesion in the medial segment left hepatic lobe (series 21/image 58), new. 3.9 x 4.6 cm peripherally enhancing low-density lesion in the posterior segment right hepatic lobe (series 21/image 26), new. Given the perispinal finding, these are suspicious for necrotic metastases. Abscesses are also possible.  Background heterogeneous perfusion of the liver.  Status post cholecystectomy. No intrahepatic or extrahepatic ductal dilatation.  Pancreas: Parenchymal atrophy.  Spleen: Within normal limits.  Adrenals/Urinary Tract: Adrenal glands are unremarkable.  Status post right nephrectomy. Native left renal atrophy with multiple probable acquired renal cysts. Transplant kidney in the left lower quadrant with additional small cysts. No hydronephrosis.  Bladder is thick-walled although  underdistended.  Stomach/Bowel: Stomach is unremarkable.  No evidence of bowel obstruction.  Extensive colonic diverticulosis, without associated inflammatory changes.  Vascular/Lymphatic: Atherosclerotic calcifications of the abdominal aorta and branch vessels.  No suspicious abdominopelvic lymphadenopathy.  Reproductive: Prostate is grossly  unremarkable.  Other: No abdominopelvic ascites.  Musculoskeletal: Degenerative changes of the lumbar spine.  IMPRESSION: Right paraspinal soft tissue mass, measuring at least 3.3 cm, worrisome for primary or metastatic malignancy. Associated osseous destruction/pathologic fracture of the right T2 transverse process.  Two hepatic lesions measuring up to 6.0 cm, worrisome for metastases, less likely abscesses.  Additional ancillary findings as above.  Electronically Signed: By: Julian Hy M.D. On: 12/26/2013 17:44     EKG Interpretation   Date/Time:  Sunday December 26 2013 12:00:34 EDT Ventricular Rate:  69 PR Interval:  189 QRS Duration: 223 QT Interval:  537 QTC Calculation: 575 R Axis:   -162 Text Interpretation:  Atrial-sensed ventricular-paced rhythm No further  analysis attempted due to paced rhythm No significant change since last  tracing Confirmed by Maryan Rued  MD, Loree Fee ([id number]) on 12/26/2013 12:30:58  PM      MDM   Final diagnoses:  Back pain  Weakness  Chest pain    53 year old male with multiple complaints, reporting his legs are weaker, increased back pain, fever today back pain 6 weeks rule out of discitis, epidural abscess, spinal cord compression. Patient unable to go through MRI due to pacemaker placement. We contacted radiology who recommended CT with contrast of T and L-spine, and possible follow-up imaging of CT myelogram with contrast tomorrow and possible nuclear med study based on these results.  Patient signed out to Dr. Debby Freiberg, M.D. to follow-up on patient's results and for appropriate consult/admission   based on results   Signed,  Dahlia Bailiff, PA-C 6:21 PM  This patient seen with Dr. Blanchie Dessert, M.D.  Carrie Mew, PA-C 12/26/13 [place] [place]

## 2013-12-26 NOTE — ED Notes (Signed)
PA at bedside.

## 2013-12-26 NOTE — ED Notes (Signed)
MD notified of pt pain

## 2013-12-26 NOTE — ED Notes (Signed)
Pt o2 sats dropping into high 80's, placed pt on 3L nasal cannula.

## 2013-12-26 NOTE — Consult Note (Signed)
Reason for Consult: Paralysis thoracic back pain and pathologic fractures T1 T2 T3 Referring Physician: Dr. Paula Libra is an 53 y.o. male.  HPI: Patient is a 52 year old individual with end-stage renal disease having been on dialysis for a number of years having had a previous pancreas and kidney transplant years ago that resulted in the renal transplant failing but the pancreas transplant continuing to work. The patient has had severe vascular disease having had amputations of the left forefoot and toes and the right leg above the knee. He has been on renal dialysis. About 3 weeks ago he started developing pain between the shoulder blades. He is seen by an orthopedist near his home and ultimately was planned to do an MRI. Is in the emergency room last Wednesday. Apparently patient states that last Wednesday he was unable to move his legs and his wife corroborates this. Because an MRI was planned it does not appear that any specific intervention was done on that last emergency room visit. Nonetheless the wife has not been able to care for him because of his paralysis and he came to the emergency room today with increasing pain between the shoulder blades. A CT scan was performed as the MRI could not be performed because the patient has had a pacemaker. He has been fairly pacer dependent. The CT scan demonstrates a paraspinous mass with involvement of the T1-T2 and T3 vertebrae. There is no clear evidence of any epidural tumor but there does appear to be pathologic involvement of the endplates of O6-Z1. The patient also has some lesions in his liver and it appears that this is a new diagnosis of cancer.  Past Medical History  Diagnosis Date  . Secondary hyperparathyroidism, renal     In setting of ESRD.  [name] Hypothyroidism   . Automatic implantable cardiac defibrillator in situ     CRT-ICD   . Hypertension   . Nonischemic cardiomyopathy     2D-echocardiogram (08/2007) - LV EF 45%, akinesis of  inferoseptal wall, inferior wall,   . ESRD on hemodialysis Started age 6yo    on MWF schedule. Previous failed renal transplant 1998 at [facility] (with rejection in 1998). 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 Resumed HD 10/2006.   [name] DM type 1 (diabetes mellitus, type 1) DX: age 55yo    previously well controlled with Aic 5 (12/2008)  . Intracranial hemorrhage     History of. On prophylactic dilantin.  . 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  . Hyperlipidemia   . Hemopericardium     History of in setting of failed radiogrequency ablation for Vtach  . Anemia of chronic disease     BL Hgb 11-13. In setting of ESRD.   [name] Peptic ulcer disease     Unknown history, no records per [facility].   . Cholelithiasis     Noted at least since 01/2006. Asymptomatic.  . Erectile dysfunction   . Peripheral autonomic neuropathy due to diabetes mellitus   . GERD (gastroesophageal reflux disease)   . Glaucoma   . Squamous cell skin cancer, finger     History of. Marginal resection in 05/2008 with recurrent infection/ lytic lesion involving distal phalanx  . Toe amputation status     Ischemic fourth and fifith toe left (03/2009), history of previous 1,2,3 toe  amputations.   [name] MRSA (methicillin resistant staph aureus) culture positive  Verify type - Per medical history form dated 02/13/10.  [name] Poor circulation     per medical history form dated 02/13/10.  . Ventricular tachycardia     appropriate ATP and Shocks 12/12  . RIATA LEAD     EXTERNALIZATION 12/12 (sk) // high RV threshold  . S/P BKA (below knee amputation)     right  . Arthritis   . Blood transfusion without reported diagnosis   . Heart murmur   . Coronary artery disease   . ICD (implantable cardiac defibrillator) in place   . Legally blind   . Arteriovenous graft for hemodialysis in place, primary 2009     left leg, had a previous graft in right arm that occulded  . Chronic cholecystitis with calculus s/p lap chole Feb 2014 03/11/2012    Past Surgical History  Procedure Laterality Date  . Cardiac defibrillator placement  11/05    St Jude  . Transplant pancreatic allograft  2002  . Toe amputation  03/2009    left 4th, 5th toes. Previous 1,2, 3 toe left.  . Kidney transplant  2002  . Above knee leg amputation  11/09/10    Right AKA  . Ablation      2006  . Cholecystectomy N/A 04/16/2012    Procedure: LAPAROSCOPIC CHOLECYSTECTOMY WITH INTRAOPERATIVE CHOLANGIOGRAM;  Surgeon: Adin Hector, MD;  Location: [place];  Service: General;  Laterality: N/A;    Family History  Problem Relation Age of Onset  . Hypertension Brother   . Hypertension Sister   . Coronary artery disease Mother     x 5 vessel graft in her 72s  . Hypertension Mother   . Prostate cancer Father   . Hypertension Father   . Cancer Father     Prostate  . Hypertension Brother     Social History:  reports that he has never smoked. He has never used smokeless tobacco. He reports that he does not drink alcohol or use illicit drugs.  Allergies:  Allergies  Allergen Reactions  . Ceftriaxone Sodium Nausea And Vomiting  . Dilaudid [Hydromorphone Hcl]     Sensitivity--respiratory distress  . Protamine     Significant hypotension  . Rocephin [Ceftriaxone Sodium In Dextrose] Nausea And Vomiting    Rocephin injection with xylocaine  . Avelox [Moxifloxacin Hcl In Nacl] Rash    Ask patient to clarify type of medication. Also need to know type/severity/reaction.  . Ciprofloxacin Rash  . Moxifloxacin Rash    Medications: I have not reviewed the patient's current medications. I am aware that he completed a prednisone dose course last Monday.  Results for orders placed during the hospital encounter of 12/26/13 (from the past 48 hour(s))  CBC WITH DIFFERENTIAL     Status: Abnormal   Collection Time    12/26/13  1:48 PM       Result Value Ref Range   WBC 12.6 (*) 4.0 - 10.5 K/uL   RBC 3.36 (*) 4.22 - 5.81 MIL/uL   Hemoglobin 10.9 (*) 13.0 - 17.0 g/dL   HCT 33.3 (*) 39.0 - 52.0 %   MCV 99.1  78.0 - 100.0 fL   MCH 32.4  26.0 - 34.0 pg   MCHC 32.7  30.0 - 36.0 g/dL   RDW 16.5 (*) 11.5 - 15.5 %   Platelets 223  150 - 400 K/uL   Neutrophils Relative % 84 (*) 43 - 77 %   Neutro Abs 10.5 (*) 1.7 - 7.7 K/uL   Lymphocytes Relative 5 (*)  12 - 46 %   Lymphs Abs 0.7  0.7 - 4.0 K/uL   Monocytes Relative 10  3 - 12 %   Monocytes Absolute 1.3 (*) 0.1 - 1.0 K/uL   Eosinophils Relative 1  0 - 5 %   Eosinophils Absolute 0.1  0.0 - 0.7 K/uL   Basophils Relative 0  0 - 1 %   Basophils Absolute 0.0  0.0 - 0.1 K/uL  POC OCCULT BLOOD, ED     Status: Abnormal   Collection Time    12/26/13  1:50 PM      Result Value Ref Range   Fecal Occult Bld POSITIVE (*) NEGATIVE  I-STAT CHEM 8, ED     Status: Abnormal   Collection Time    12/26/13  2:04 PM      Result Value Ref Range   Sodium 137  137 - 147 mEq/L   Potassium 4.3  3.7 - 5.3 mEq/L   Chloride 96  96 - 112 mEq/L   BUN 41 (*) 6 - 23 mg/dL   Creatinine, Ser 4.90 (*) 0.50 - 1.35 mg/dL   Glucose, Bld 90  70 - 99 mg/dL   Calcium, Ion 1.13  1.12 - 1.23 mmol/L   TCO2 33  0 - 100 mmol/L   Hemoglobin 12.2 (*) 13.0 - 17.0 g/dL   HCT 36.0 (*) 39.0 - 52.0 %    Ct Chest W Contrast  12/26/2013   ADDENDUM REPORT: 12/26/2013 18:02  ADDENDUM: Addendum for further clarification.  The right paraspinal lesion demonstrates a significant soft tissue component which extends superiorly, adjacent to the right posterior spinous process at T1, where it measures approximately 2.5 x 3.3 cm (series 21/image 4).  Inferiorly/posteriorly, the tumor extends along the posterior spinous process at T3 (series 201/image 10), with suspected pathologic fracture (series 223/image 67).  The differential for this lesion includes malignant nerve sheath tumors given the general location. However, the unusual  appearance, as well as the liver lesions, raise the possibility that this is an osseous/extraosseous metastasis from an unknown primary.  The portion of the lesion which is adjacent to the posterior spinous processes at T1 or T3 may provide a convenient location for percutaneous sampling.   Electronically Signed   By: Julian Hy M.D.   On: 12/26/2013 18:02   12/26/2013   CLINICAL DATA:  Chest pain. Evaluate for intra-abdominal abscess or vascular issue. Evaluate for epidural abscess, discitis, or cord compression. History of right above knee amputation, CAD, kidney transplant. Now with ESRD on dialysis.  EXAM: CT CHEST, ABDOMEN, AND PELVIS WITH CONTRAST  TECHNIQUE: Multidetector CT imaging of the chest, abdomen and pelvis was performed following the standard protocol during bolus administration of intravenous contrast.  CONTRAST:  143mL OMNIPAQUE IOHEXOL 300 MG/ML  SOLN  COMPARISON:  [place] CT abdomen dated 11/12/2011. [place] CTA chest dated 06/18/2011. [facility] CT abdomen pelvis dated 12/03/2008.  FINDINGS: CT CHEST FINDINGS  Mildly dependent atelectasis in the lung bases. Trace bilateral pleural effusions. No suspicious pulmonary nodules. Mild emphysematous changes. No pneumothorax.  Visualized thyroid is heterogeneous.  Cardiomegaly. No pericardial effusion. Coronary atherosclerosis. Atherosclerotic calcifications of the aortic arch. Right subclavian ICD.  Small mediastinal lymph nodes which do not meet both CT size criteria.  Mild wall thickening involving the mid/ distal esophagus, nonspecific.  Right paraspinal soft tissue mass, measuring at least 1.9 x 3.3 x 1.9 cm (series 201/images 9-12), with associated destruction/pathologic fracture of the right T2 transverse process (series 222/image 63). Lesion is posterior  to the right lung apex but favored to be extrapleural in location. Additional abnormal soft tissue likely extends posterior to the right T2 transverse process (series 21/image 7).   Degenerative changes of the thoracic spine. Multiple old left rib fracture deformities.  CT ABDOMEN AND PELVIS FINDINGS  Hepatobiliary: 3.6 x 6.0 cm peripherally enhancing lesion in the medial segment left hepatic lobe (series 21/image 58), new. 3.9 x 4.6 cm peripherally enhancing low-density lesion in the posterior segment right hepatic lobe (series 21/image 26), new. Given the perispinal finding, these are suspicious for necrotic metastases. Abscesses are also possible.  Background heterogeneous perfusion of the liver.  Status post cholecystectomy. No intrahepatic or extrahepatic ductal dilatation.  Pancreas: Parenchymal atrophy.  Spleen: Within normal limits.  Adrenals/Urinary Tract: Adrenal glands are unremarkable.  Status post right nephrectomy. Native left renal atrophy with multiple probable acquired renal cysts. Transplant kidney in the left lower quadrant with additional small cysts. No hydronephrosis.  Bladder is thick-walled although underdistended.  Stomach/Bowel: Stomach is unremarkable.  No evidence of bowel obstruction.  Extensive colonic diverticulosis, without associated inflammatory changes.  Vascular/Lymphatic: Atherosclerotic calcifications of the abdominal aorta and branch vessels.  No suspicious abdominopelvic lymphadenopathy.  Reproductive: Prostate is grossly unremarkable.  Other: No abdominopelvic ascites.  Musculoskeletal: Degenerative changes of the lumbar spine.  IMPRESSION: Right paraspinal soft tissue mass, measuring at least 3.3 cm, worrisome for primary or metastatic malignancy. Associated osseous destruction/pathologic fracture of the right T2 transverse process.  Two hepatic lesions measuring up to 6.0 cm, worrisome for metastases, less likely abscesses.  Additional ancillary findings as above.  Electronically Signed: By: Julian Hy M.D. On: 12/26/2013 17:44   Ct Thoracic Spine W Contrast  12/26/2013   CLINICAL DATA:  Back pain. Evaluate for epidural abscess, discitis, or  cord compression.  EXAM: CT THORACIC SPINE WITH CONTRAST  TECHNIQUE: Multidetector CT imaging of the thoracic spine was performed using the standard protocol following bolus administration of intravenous contrast. Multiplanar CT image reconstructions were also generated.  CONTRAST:  149mL OMNIPAQUE IOHEXOL 300 MG/ML  SOLN  COMPARISON:  None.  FINDINGS: Right paraspinal tumor is present, centered along the right T2 transverse process, with associated pathologic fracture/osseous destruction (series 206/image 19).  Tumor extends superiorly adjacent to the right T1 posterior spinous process (series 207/image 1), where measures approximately 2.7 x 3.2 cm.  Tumor extends laterally/anteriorly along the posterior right lung apex (series 207/image 13-20), where it measures approximately 1.9 x 3.5 cm.  Tumor extends posteriorly cyst inferiorly along the T3 posterior spinous process (series 207/image 10), where measures approximately 3.7 x 4.3 cm, with associated pathologic fracture (series 206/images 21-22).  Although difficult to definitively state on CT, there is no convincing tumor extension into the spinal canal (series 210/image 27).  This appearance favors osseous/extraosseous metastases, less likely malignant nerve sheath tumor.  Degenerative changes of the lower cervical spine.  Exaggerated thoracic kyphosis.  Mild multilevel degenerative changes of the thoracic spine. Vertebral body heights are maintained.  Spinal canal remains patent.  Renal osteodystrophy.  IMPRESSION: Right paraspinal tumor along T1-3, as described above. This appearance favors osseous/extraosseous metastases, less likely malignant nerve sheath tumor.  Associated osseous destruction with pathologic fracture involving the right T2 transverse process and T3 posterior spinous process.  No definite extension into the spinal canal.   Electronically Signed   By: Julian Hy M.D.   On: 12/26/2013 18:13   Ct Lumbar Spine W Contrast  12/26/2013    CLINICAL DATA:  Back pain, weakness. Evaluate for  epidural abscess, discitis, or cord compression.  EXAM: CT LUMBAR SPINE WITH CONTRAST  TECHNIQUE: Multidetector CT imaging of the lumbar spine was performed with intravenous contrast administration. Multiplanar CT image reconstructions were also generated.  CONTRAST:  127mL OMNIPAQUE IOHEXOL 300 MG/ML  SOLN  COMPARISON:  None.  FINDINGS: Five lumbar type vertebral bodies.  Normal lumbar lordosis.  No evidence of fracture or dislocation. Vertebral body heights are maintained.  Mild multilevel degenerative changes.  Spinal canal is widely patent.  Small posterior disc bulges at L3-4, L4-5, and L5-S1 (series 217/ image 41).  Mild lateral recess stenosis with facet arthropathy at L4-5 and L5-S1.  No evidence of epidural abscess or discitis osteomyelitis.  Liver lesion in the posterior segment right hepatic lobe (series 227/image 48), better visualized on CT abdomen/pelvis.  IMPRESSION: Small posterior disc bulges at L3-4, L4-5, and L5-S1.  Spinal canal remains patent.  No evidence of epidural abscess or discitis osteomyelitis.   Electronically Signed   By: Julian Hy M.D.   On: 12/26/2013 18:21   Ct Abdomen Pelvis W Contrast  12/26/2013   ADDENDUM REPORT: 12/26/2013 18:02  ADDENDUM: Addendum for further clarification.  The right paraspinal lesion demonstrates a significant soft tissue component which extends superiorly, adjacent to the right posterior spinous process at T1, where it measures approximately 2.5 x 3.3 cm (series 21/image 4).  Inferiorly/posteriorly, the tumor extends along the posterior spinous process at T3 (series 201/image 10), with suspected pathologic fracture (series 223/image 67).  The differential for this lesion includes malignant nerve sheath tumors given the general location. However, the unusual appearance, as well as the liver lesions, raise the possibility that this is an osseous/extraosseous metastasis from an unknown primary.  The  portion of the lesion which is adjacent to the posterior spinous processes at T1 or T3 may provide a convenient location for percutaneous sampling.   Electronically Signed   By: Julian Hy M.D.   On: 12/26/2013 18:02   12/26/2013   CLINICAL DATA:  Chest pain. Evaluate for intra-abdominal abscess or vascular issue. Evaluate for epidural abscess, discitis, or cord compression. History of right above knee amputation, CAD, kidney transplant. Now with ESRD on dialysis.  EXAM: CT CHEST, ABDOMEN, AND PELVIS WITH CONTRAST  TECHNIQUE: Multidetector CT imaging of the chest, abdomen and pelvis was performed following the standard protocol during bolus administration of intravenous contrast.  CONTRAST:  152mL OMNIPAQUE IOHEXOL 300 MG/ML  SOLN  COMPARISON:  [place] CT abdomen dated 11/12/2011. [place] CTA chest dated 06/18/2011. [facility] CT abdomen pelvis dated 12/03/2008.  FINDINGS: CT CHEST FINDINGS  Mildly dependent atelectasis in the lung bases. Trace bilateral pleural effusions. No suspicious pulmonary nodules. Mild emphysematous changes. No pneumothorax.  Visualized thyroid is heterogeneous.  Cardiomegaly. No pericardial effusion. Coronary atherosclerosis. Atherosclerotic calcifications of the aortic arch. Right subclavian ICD.  Small mediastinal lymph nodes which do not meet both CT size criteria.  Mild wall thickening involving the mid/ distal esophagus, nonspecific.  Right paraspinal soft tissue mass, measuring at least 1.9 x 3.3 x 1.9 cm (series 201/images 9-12), with associated destruction/pathologic fracture of the right T2 transverse process (series 222/image 63). Lesion is posterior to the right lung apex but favored to be extrapleural in location. Additional abnormal soft tissue likely extends posterior to the right T2 transverse process (series 21/image 7).  Degenerative changes of the thoracic spine. Multiple old left rib fracture deformities.  CT ABDOMEN AND PELVIS FINDINGS  Hepatobiliary: 3.6 x 6.0  cm peripherally enhancing lesion in the medial  segment left hepatic lobe (series 21/image 58), new. 3.9 x 4.6 cm peripherally enhancing low-density lesion in the posterior segment right hepatic lobe (series 21/image 26), new. Given the perispinal finding, these are suspicious for necrotic metastases. Abscesses are also possible.  Background heterogeneous perfusion of the liver.  Status post cholecystectomy. No intrahepatic or extrahepatic ductal dilatation.  Pancreas: Parenchymal atrophy.  Spleen: Within normal limits.  Adrenals/Urinary Tract: Adrenal glands are unremarkable.  Status post right nephrectomy. Native left renal atrophy with multiple probable acquired renal cysts. Transplant kidney in the left lower quadrant with additional small cysts. No hydronephrosis.  Bladder is thick-walled although underdistended.  Stomach/Bowel: Stomach is unremarkable.  No evidence of bowel obstruction.  Extensive colonic diverticulosis, without associated inflammatory changes.  Vascular/Lymphatic: Atherosclerotic calcifications of the abdominal aorta and branch vessels.  No suspicious abdominopelvic lymphadenopathy.  Reproductive: Prostate is grossly unremarkable.  Other: No abdominopelvic ascites.  Musculoskeletal: Degenerative changes of the lumbar spine.  IMPRESSION: Right paraspinal soft tissue mass, measuring at least 3.3 cm, worrisome for primary or metastatic malignancy. Associated osseous destruction/pathologic fracture of the right T2 transverse process.  Two hepatic lesions measuring up to 6.0 cm, worrisome for metastases, less likely abscesses.  Additional ancillary findings as above.  Electronically Signed: By: Julian Hy M.D. On: 12/26/2013 17:44    Review of Systems  Constitutional: Positive for fever.       Fever just today, low-grade  Eyes: Negative.   Respiratory: Negative.   Cardiovascular: Negative.   Gastrointestinal: Negative.   Genitourinary: Negative.   Musculoskeletal: Positive for  back pain and neck pain.       Pain between shoulder blades for several weeks  Skin: Negative.   Neurological:       Inability to move lower extremities since Wednesday  Endo/Heme/Allergies: Negative.   Psychiatric/Behavioral: Negative.    Blood pressure 98/49, pulse 65, temperature 98.4 F (36.9 C), temperature source Oral, resp. rate 16, SpO2 98.00%. Physical Exam  Constitutional: He appears well-developed and well-nourished.  HENT:  Head: Normocephalic and atraumatic.  Eyes: Pupils are equal, round, and reactive to light.  Neck: Normal range of motion. Neck supple.  Neurological:  Good movement of both upper extremities but patient complains of pain in the proximal shoulders. He has no sensation on his chest below level of the nipples down either lower extremity. He has no volitional movement of either lower extremity he hasn't above-knee amputation on the right side.  Skin: Skin is warm and dry.  Psychiatric: He has a normal mood and affect.  Patient is very sleepy but will follow simple commands he has received intravenous pain medication.    Assessment/Plan: Complete paralysis of lower extremities with a sensory level at approximately T3 or T4. The patient has lesions at T1-T2 and T3 area with epidural and paraspinous tumor.  High-dose steroids may be given to see if this helps manage the patient's pain but I would ask interventional radiology to do a needle biopsy of the soft tissue mass in the spine or his liver which ever they feel is more easily accessible. I do not feel that extensive surgical decompression is likely to yield any improvement in the patient's neurologic function therefore I would advise conservative management with high-dose steroids radiation and/or chemotherapy depending on the findings of the biopsy.  Matilyn Fehrman J 12/26/2013, 10:11 PM

## 2013-12-26 NOTE — ED Provider Notes (Signed)
  Physical Exam  BP 95/47  Pulse 66  Temp(Src) 98.4 F (36.9 C) (Oral)  Resp 21  SpO2 98%  Physical Exam  ED Course  Procedures  MDM Assumed care of pt from Dr. Maryan Rued awaiting CT scan of spine and cap.    This returned with a mass of t1-t3, as well as hepatic masses which could be necrotic versus metastatic.  He has a ho prior pancreas tx and has been on immunosuppresant in the past, as well as having a fever to 100.4 this morning, but has otherwise been without significant symptoms with exception of back pain and paralysis of remaining L leg.  Consulted [facility] who do not feel he is a candidate for heroic surgical measures at this time.  Consulted hospitalist for admission.  No indication for antibiotics given overall picture.  Diagnoses of Back pain, Weakness, and Chest pain were pertinent to this visit.       Debby Freiberg, MD 12/27/13 [phone]

## 2013-12-26 NOTE — ED Provider Notes (Signed)
Medical screening examination/treatment/procedure(s) were conducted as a shared visit with non-physician practitioner(s) and myself.  I personally evaluated the patient during the encounter.   EKG Interpretation   Date/Time:  Sunday December 26 2013 12:00:34 EDT Ventricular Rate:  69 PR Interval:  189 QRS Duration: 223 QT Interval:  537 QTC Calculation: 575 R Axis:   -162 Text Interpretation:  Atrial-sensed ventricular-paced rhythm No further  analysis attempted due to paced rhythm No significant change since last  tracing Confirmed by [facility]  MD, Loree Fee ([id number]) on 12/26/2013 12:30:58  PM      Pt with sx concerning for cord compromise with weakness, numbness and decreased rectal tone  Blanchie Dessert, MD 12/26/13 2138

## 2013-12-26 NOTE — ED Notes (Signed)
Pt gone to CT at this time.

## 2013-12-26 NOTE — ED Notes (Signed)
PA at bedside. Doppler used to access pulse in left foot. Pt expressing to PA reason for being here is because his back has hurt for 6 weeks.

## 2013-12-27 ENCOUNTER — Encounter (HOSPITAL_COMMUNITY): Payer: Self-pay | Admitting: *Deleted

## 2013-12-27 DIAGNOSIS — K7689 Other specified diseases of liver: Secondary | ICD-10-CM

## 2013-12-27 DIAGNOSIS — N186 End stage renal disease: Secondary | ICD-10-CM

## 2013-12-27 DIAGNOSIS — Z9483 Pancreas transplant status: Secondary | ICD-10-CM

## 2013-12-27 DIAGNOSIS — R222 Localized swelling, mass and lump, trunk: Secondary | ICD-10-CM

## 2013-12-27 LAB — COMPREHENSIVE METABOLIC PANEL
ALBUMIN: 2.2 g/dL — AB (ref 3.5–5.2)
ALT: 85 U/L — ABNORMAL HIGH (ref 0–53)
ANION GAP: 17 — AB (ref 5–15)
AST: 203 U/L — ABNORMAL HIGH (ref 0–37)
Alkaline Phosphatase: 357 U/L — ABNORMAL HIGH (ref 39–117)
BUN: 52 mg/dL — AB (ref 6–23)
CALCIUM: 9.2 mg/dL (ref 8.4–10.5)
CO2: 27 mEq/L (ref 19–32)
CREATININE: 5.91 mg/dL — AB (ref 0.50–1.35)
Chloride: 94 mEq/L — ABNORMAL LOW (ref 96–112)
GFR calc non Af Amer: 10 mL/min — ABNORMAL LOW (ref >90)
GFR, EST AFRICAN AMERICAN: 11 mL/min — AB (ref >90)
Glucose, Bld: 91 mg/dL (ref 70–99)
POTASSIUM: 5 meq/L (ref 3.7–5.3)
Sodium: 138 mEq/L (ref 137–147)
Total Bilirubin: 0.4 mg/dL (ref 0.3–1.2)
Total Protein: 6.7 g/dL (ref 6.0–8.3)

## 2013-12-27 LAB — MRSA PCR SCREENING: MRSA BY PCR: NEGATIVE

## 2013-12-27 LAB — CBC
HCT: 31 % — ABNORMAL LOW (ref 39.0–52.0)
HEMOGLOBIN: 10 g/dL — AB (ref 13.0–17.0)
MCH: 31.8 pg (ref 26.0–34.0)
MCHC: 32.3 g/dL (ref 30.0–36.0)
MCV: 98.7 fL (ref 78.0–100.0)
Platelets: 203 10*3/uL (ref 150–400)
RBC: 3.14 MIL/uL — AB (ref 4.22–5.81)
RDW: 16.8 % — ABNORMAL HIGH (ref 11.5–15.5)
WBC: 11.6 10*3/uL — AB (ref 4.0–10.5)

## 2013-12-27 LAB — PROTIME-INR
INR: 1.45 (ref 0.00–1.49)
Prothrombin Time: 17.8 seconds — ABNORMAL HIGH (ref 11.6–15.2)

## 2013-12-27 LAB — APTT: aPTT: 40 seconds — ABNORMAL HIGH (ref 24–37)

## 2013-12-27 LAB — GLUCOSE, CAPILLARY: GLUCOSE-CAPILLARY: 85 mg/dL (ref 70–99)

## 2013-12-27 MED ORDER — PENTAFLUOROPROP-TETRAFLUOROETH EX AERO: 1.0000 "application " | INHALATION_SPRAY | CUTANEOUS | Status: DC | PRN

## 2013-12-27 MED ORDER — LIDOCAINE HCL (PF) 1 % IJ SOLN: 5.0000 mL | INTRAMUSCULAR | Status: DC | PRN

## 2013-12-27 MED ORDER — HYDROCORTISONE NA SUCCINATE PF 100 MG IJ SOLR
50.0000 mg | Freq: Four times a day (QID) | INTRAMUSCULAR | Status: DC
  Administered 2013-12-27 – 2014-01-03 (×28): 50 mg via INTRAVENOUS
  Filled 2013-12-27 (×33): qty 1

## 2013-12-27 MED ORDER — ALTEPLASE 2 MG IJ SOLR
2.0000 mg | Freq: Once | INTRAMUSCULAR | Status: DC | PRN
  Filled 2013-12-27: qty 2

## 2013-12-27 MED ORDER — SODIUM CHLORIDE 0.9 % IV SOLN
62.5000 mg | INTRAVENOUS | Status: DC
  Administered 2013-12-27 – 2013-12-29 (×2): 62.5 mg via INTRAVENOUS
  Filled 2013-12-27 (×3): qty 5

## 2013-12-27 MED ORDER — LIDOCAINE-PRILOCAINE 2.5-2.5 % EX CREA
1.0000 "application " | TOPICAL_CREAM | CUTANEOUS | Status: DC | PRN
  Filled 2013-12-27: qty 5

## 2013-12-27 MED ORDER — MORPHINE SULFATE 2 MG/ML IJ SOLN
1.0000 mg | INTRAMUSCULAR | Status: DC | PRN
  Administered 2014-01-01: 2 mg via INTRAVENOUS
  Filled 2013-12-27: qty 1

## 2013-12-27 MED ORDER — SODIUM CHLORIDE 0.9 % IV BOLUS (SEPSIS)
250.0000 mL | Freq: Once | INTRAVENOUS | Status: AC
  Administered 2013-12-27: 250 mL via INTRAVENOUS

## 2013-12-27 MED ORDER — SODIUM CHLORIDE 0.9 % IV SOLN: 100.0000 mL | INTRAVENOUS | Status: DC | PRN

## 2013-12-27 MED ORDER — CETYLPYRIDINIUM CHLORIDE 0.05 % MT LIQD
7.0000 mL | Freq: Two times a day (BID) | OROMUCOSAL | Status: DC
  Administered 2013-12-27 – 2014-01-03 (×14): 7 mL via OROMUCOSAL

## 2013-12-27 MED ORDER — HEPARIN SODIUM (PORCINE) 1000 UNIT/ML DIALYSIS: 20.0000 [IU]/kg | INTRAMUSCULAR | Status: DC | PRN

## 2013-12-27 MED ORDER — CALCITRIOL 0.5 MCG PO CAPS
0.5000 ug | ORAL_CAPSULE | ORAL | Status: DC
  Administered 2013-12-27 – 2013-12-31 (×3): 0.5 ug via ORAL
  Filled 2013-12-27 (×5): qty 1

## 2013-12-27 MED ORDER — SODIUM CHLORIDE 0.9 % IV SOLN
100.0000 mL | INTRAVENOUS | Status: DC | PRN
Start: 2013-12-27 — End: 2013-12-27

## 2013-12-27 MED ORDER — NEPRO/CARBSTEADY PO LIQD
237.0000 mL | ORAL | Status: DC | PRN
  Filled 2013-12-27: qty 237

## 2013-12-27 MED ORDER — HEPARIN SODIUM (PORCINE) 1000 UNIT/ML DIALYSIS: 1000.0000 [IU] | INTRAMUSCULAR | Status: DC | PRN

## 2013-12-27 NOTE — Progress Notes (Signed)
Patient ID: [name], male   DOB: 11/02/1960, 52 y.o.   MRN: [id number] Spoke with Dr Jola Baptist today to facilitate needle bx.

## 2013-12-27 NOTE — Progress Notes (Signed)
Pts BP is soft 84/44 paged NP Erin Hearing, 239ml bolus given per NP order.   Kashana Breach M. Dalbert Batman, RN, BSN 12/27/2013 7:26 AM

## 2013-12-27 NOTE — Progress Notes (Addendum)
[place] TEAM 1 - Stepdown/ICU TEAM Progress Note  IAN CASTAGNA IRW:[id number] DOB: 02-06-1961 DOA: 12/26/2013 PCP: Maris Berger  Admit HPI / Brief Narrative: 53 y.o. 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  He was prescribed a muscle relaxant by his renal doctor, which did not help. His back pain started radiating to the R arm. 11 days prior patient was evaluated by an Orthopedic surgeon in [place]. He was treated with tapering dose steroids which also did not help. On 10/22, patient started having severe weakness of his left leg (he s/p R AKA). He could not walk anymore. He went back to his Orthopedic Surgeon again who planned to do a CT scan, but this had not been done yet. Because of worsening symptoms, patient came to the hospital.   A CT scan was performed which demonstrated a paraspinous mass with involvement of the T1-T2 and T3 vertebrae. There was no clear evidence of an epidural tumor but there did appear to be pathologic involvement of the endplates of P6-P9. The patient also had some lesions in his liver.   HPI/Subjective: Pt c/o radicular type pain affecting both of his arms, w/ pain radiating down from his shoulders to his elbows.  He states this has been present for days.  He denies cp, n/v, sob, or abdom pain.    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-T2 and T3 area with epidural and paraspinous tumor - high dose steroids - surgical intervention not felt to offer benefit  - pain control: MS contin and morphine prn - IR to do a needle biopsy of his liver   Hypotension  - multifactorial likely, due to ESRD, possible adrenal insuff, hypovolemia, and pain meds  - well tolerated at present - follow trend   Chronic systolic CHF - 2-D echo 5/09/3 showed EF of 45% - volume control per HD   ESRD on HD - Dialysis on Monday/Wednesday/Friday - Nephrology following   Hx of pancreas/kidney transplantation - renal failed - pancreas functional  - continue home immunosuppressive meds   Hypothyroidism - TSH was 1.02 on 03/03/11 - check TSH  S/P AICD  DM CBG well controlled   HLD  Anemia of chronic kidney disease  Code Status: FULL Family Communication: spoke w/ wife at bedside  Disposition Plan: SDU   Consultants: Nephrology  Neurosurgery  IR  Procedures: none  Antibiotics: none  DVT prophylaxis: SQ heparin   Objective: Blood pressure 83/59, pulse 60, temperature 98.9 F (37.2 C), temperature source Oral, resp. rate 15, height 5' 10.5" (1.791 m), weight 65 kg (143 lb 4.8 oz), SpO2 100.00%.  Intake/Output Summary (Last 24 hours) at 12/27/13 1732 Last data filed at 12/27/13 1700  Gross per 24 hour  Intake    253 ml  Output    -88 ml  Net    341 ml   Exam: General: No acute respiratory distress Lungs: Clear to auscultation bilaterally without wheezes or crackles Cardiovascular: Regular rate and rhythm without gallop or rub, 2/6 systolic M  Abdomen: Nontender, nondistended, soft, bowel sounds positive, no rebound, no ascites, no appreciable mass Extremities: No significant cyanosis, clubbing, or edema L LE   Data Reviewed: Basic Metabolic Panel:  Recent Labs Lab 12/22/13 1904 12/26/13 1404 12/27/13  0305  NA 141 137 138  K 3.7 4.3 5.0  CL 95* 96 94*  CO2 34*  --  27  GLUCOSE 102* 90 91  BUN 14 41* 52*  CREATININE 3.14* 4.90* 5.91*  CALCIUM 9.4  --  9.2  MG 2.1  --   --     Liver Function Tests:  Recent Labs Lab 12/22/13 1904 12/27/13 0305  AST 15 203*  ALT 9 85*  ALKPHOS 182* 357*  BILITOT 0.3 0.4  PROT 7.4 6.7  ALBUMIN 2.8* 2.2*    Recent Labs Lab 12/22/13 1904  LIPASE 37   Coags:  Recent  Labs Lab 12/27/13 0305  INR 1.45    Recent Labs Lab 12/27/13 0305  APTT 40*    CBC:  Recent Labs Lab 12/22/13 1904 12/26/13 1348 12/26/13 1404 12/27/13 0305  WBC 6.6 12.6*  --  11.6*  NEUTROABS 5.0 10.5*  --   --   HGB 12.0* 10.9* 12.2* 10.0*  HCT 36.2* 33.3* 36.0* 31.0*  MCV 98.9 99.1  --  98.7  PLT 255 223  --  203   CBG:  Recent Labs Lab 12/27/13 0818  GLUCAP 85    Recent Results (from the past 240 hour(s))  MRSA PCR SCREENING     Status: None   Collection Time    12/26/13 11:15 PM      Result Value Ref Range Status   MRSA by PCR NEGATIVE  NEGATIVE Final   Comment:            The GeneXpert MRSA Assay (FDA     approved for NASAL specimens     only), is one component of a     comprehensive MRSA colonization     surveillance program. It is not     intended to diagnose MRSA     infection nor to guide or     monitor treatment for     MRSA infections.     Studies:  Recent x-ray studies have been reviewed in detail by the Attending Physician  Scheduled Meds:  Scheduled Meds: . amiodarone  200 mg Oral QPM  . antiseptic oral rinse  7 mL Mouth Rinse BID  . azaTHIOprine  75 mg Oral Daily  . calcitRIOL  0.5 mcg Oral Q M,W,F-HD  . [START ON 12/29/2013] ferric gluconate (FERRLECIT/NULECIT) IV  62.5 mg Intravenous Q Wed-HD  . gabapentin  300 mg Oral QHS  . heparin  5,000 Units Subcutaneous 3 times per day  . hydrocortisone sod succinate (SOLU-CORTEF) inj  50 mg Intravenous 4 times per day  . lanthanum  1,000 mg Oral TID WC  . latanoprost  1 drop Both Eyes QHS  . levothyroxine  225 mcg Oral QAC breakfast  . morphine  15 mg Oral Q12H  . multivitamin  1 tablet Oral Daily  . phenytoin  100 mg Oral 3 times per day  . polycarbophil  625 mg Oral Daily  . rosuvastatin  5 mg Oral QODAY  . sodium chloride  3 mL Intravenous Q12H  . tacrolimus  2 mg Oral BID  . vitamin C  500 mg Oral BID    Time spent on care of this patient: 35 mins   Nadja Lina T ,  MD   [facility] Office  [phone] Pager - Text Page per Shea Evans as per below:  On-Call/Text Page:      [email or web address]      password [email or web address]  If 7PM-7AM, please contact night-coverage [email or web address] Password [facility] 12/27/2013, 5:32 PM  LOS: 1 day

## 2013-12-27 NOTE — Consult Note (Signed)
[name] 12/27/2013 Rexene Agent Requesting Physician:  Blaine Hamper  Reason for Consult:  Comanagement of ESRD pt, paraylsis, ? New diagnosis metastatic malignancy  HPI:  32M ESRD MWF [place] [facility] via LLE AVG admitted with paralysis in LEs, back pain, and CT identified paraspinous mass with involvement of T1-T3.  Also noted liver lesions worrisome for metatases and working diagnosis of metastatic disease with unkonwn primary.  Current plan is for needle biopsy with IR.    Pt w/ ESRD and s/p KP Transplant with failed Kidney but persistent pacreatic endocrine function in 2002.  Maintenance IS is Tac/Pred/Aza.  Maintenance HD has been uneventful of late.   He was having back pain when seen 10/19 and then on 10/21 developed weaknessa nd loss of sensation in lower extremity.  Admitted 10/25.  Had been to ED 10/21. Has developed fecal incontinence.    Other PMH notable for PVD s/p R AKA, CABG.      Filed Weights   12/26/13 2307 12/27/13 0500  Weight: 61.4 kg (135 lb 5.8 oz) 61.6 kg (135 lb 12.9 oz)    I/O last 3 completed shifts: In: 5 [I.V.:50] Out: -   ROS Balance of 12 systems is negative w/ exceptions as above  Outpt HD Orders Unit: [place] Days: MWF Time: 4.5h Dialyzer: F160 EDW: 58kg K/Ca: 2K/2.25Ca Access: AVG L Leg Needle Size: 15g BFR/DFR: 400/a1.5 UF Proflie: None VDRA: Calcitriol 0.21mcg qMWF EPO: None IV Fe: 50mg  qWk Heparin: 2000 units IVP  qTx Most Recent Phos / PTH: 3.9 / 321 in 11/2013 Most Recent TSAT: 33% 11/2013 Most Recent eKT/V: 1.85 12/08/13 Treatment Adherence: Excellent  PMH  Past Medical History  Diagnosis Date  . Secondary hyperparathyroidism, renal     In setting of ESRD.  [name] Hypothyroidism   . Automatic implantable cardiac defibrillator in situ     CRT-ICD   . Hypertension   . Nonischemic cardiomyopathy     2D-echocardiogram (08/2007) - LV EF 45%, akinesis of inferoseptal wall, inferior wall,   . ESRD on hemodialysis Started age 38yo   on MWF schedule. Previous failed renal transplant 1998 at [facility] (with rejection in 1998). 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 Resumed HD 10/2006.   [name] DM type 1 (diabetes mellitus, type 1) DX: age 72yo    previously well controlled with Aic 5 (12/2008)  . Intracranial hemorrhage     History of. On prophylactic dilantin.  . 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  . Hyperlipidemia   . Hemopericardium     History of in setting of failed radiogrequency ablation for Vtach  . Anemia of chronic disease     BL Hgb 11-13. In setting of ESRD.   [name] Peptic ulcer disease     Unknown history, no records per [employer].   . Cholelithiasis     Noted at least since 01/2006. Asymptomatic.  . Erectile dysfunction   . Peripheral autonomic neuropathy due to diabetes mellitus   . GERD (gastroesophageal reflux disease)   . Glaucoma   . Squamous cell skin cancer, finger     History of. Marginal resection in 05/2008 with recurrent infection/ lytic lesion involving distal phalanx  . Toe amputation status     Ischemic fourth and fifith toe left (03/2009), history of previous 1,2,3 toe  amputations.   [name] MRSA (methicillin resistant staph aureus) culture positive     Verify type -  Per medical history form dated 02/13/10.  [name] Poor circulation     per medical history form dated 02/13/10.  . Ventricular tachycardia     appropriate ATP and Shocks 12/12  . RIATA LEAD     EXTERNALIZATION 12/12 (sk) // high RV threshold  . S/P BKA (below knee amputation)     right  . Arthritis   . Blood transfusion without reported diagnosis   . Heart murmur   . Coronary artery disease   . ICD (implantable cardiac defibrillator) in place   . Legally blind   . Arteriovenous graft for hemodialysis in place, primary 2009    left leg, had a previous graft in right arm that occulded  . Chronic  cholecystitis with calculus s/p lap chole Feb 2014 03/11/2012   PSH  Past Surgical History  Procedure Laterality Date  . Cardiac defibrillator placement  11/05    [employer] Jude  . Transplant pancreatic allograft  2002  . Toe amputation  03/2009    left 4th, 5th toes. Previous 1,2, 3 toe left.  . Kidney transplant  2002  . Above knee leg amputation  11/09/10    Right AKA  . Ablation      2006  . Cholecystectomy N/A 04/16/2012    Procedure: LAPAROSCOPIC CHOLECYSTECTOMY WITH INTRAOPERATIVE CHOLANGIOGRAM;  Surgeon: Adin Hector, MD;  Location: [place];  Service: General;  Laterality: N/A;  . Coronary artery bypass graft    . Vascular surgery    . Eye surgery    . Insert / replace / remove pacemaker     FH  Family History  Problem Relation Age of Onset  . Hypertension Brother   . Hypertension Sister   . Coronary artery disease Mother     x 5 vessel graft in her 46s  . Hypertension Mother   . Prostate cancer Father   . Hypertension Father   . Cancer Father     Prostate  . Hypertension Brother    [place]  reports that he has never smoked. He has never used smokeless tobacco. He reports that he does not drink alcohol or use illicit drugs. Allergies  Allergies  Allergen Reactions  . Ceftriaxone Sodium Nausea And Vomiting  . Dilaudid [Hydromorphone Hcl]     Sensitivity--respiratory distress  . Protamine     Significant hypotension  . Rocephin [Ceftriaxone Sodium In Dextrose] Nausea And Vomiting    Rocephin injection with xylocaine  . Avelox [Moxifloxacin Hcl In Nacl] Rash    Ask patient to clarify type of medication. Also need to know type/severity/reaction.  . Ciprofloxacin Rash  . Moxifloxacin Rash   Home medications Prior to Admission medications   Medication Sig Start Date End Date Taking? Authorizing Provider  acetaminophen (TYLENOL) 500 MG tablet Take 1,000 mg by mouth every 6 (six) hours as needed for mild pain.   Yes Historical Provider, MD  amiodarone (PACERONE) 200 MG  tablet Take 200 mg by mouth every evening.    Yes Historical Provider, MD  azaTHIOprine (IMURAN) 50 MG tablet Take 75 mg by mouth daily.  12/07/11  Yes Historical Provider, MD  bimatoprost (LUMIGAN) 0.03 % ophthalmic solution Place 1 drop into both eyes at bedtime.    Yes Historical Provider, MD  Calcium Polycarbophil (EQUALACTIN PO) Take 2 tablets by mouth 2 (two) times daily.   Yes Historical Provider, MD  carvedilol (COREG) 3.125 MG tablet Take 3.125 mg by mouth See admin instructions. Takes after dialysis on Monday, Wednesday, Friday, and Saturday  Yes Historical Provider, MD  gabapentin (NEURONTIN) 100 MG capsule Take 300 mg by mouth at bedtime.  09/09/12  Yes Historical Provider, MD  HYDROcodone-acetaminophen (LORTAB) 10-500 MG per tablet Take 0.5 tablets by mouth 2 (two) times daily as needed for pain. 12/22/13  Yes Mercedes Strupp Camprubi-Soms, PA-C  lanthanum (FOSRENOL) 1000 MG chewable tablet Chew 1,000 mg by mouth 3 (three) times daily with meals. And with snacks   Yes Historical Provider, MD  levothyroxine (SYNTHROID, LEVOTHROID) 75 MCG tablet Take 225 mcg by mouth daily.  12/31/11  Yes Historical Provider, MD  loperamide (IMODIUM) 2 MG capsule Take 1 mg by mouth 2 (two) times daily as needed for diarrhea or loose stools.    Yes Historical Provider, MD  Lutein 20 MG TABS Take 1 tablet by mouth every evening.    Yes Historical Provider, MD  multivitamin (RENA-VIT) TABS tablet Take 1 tablet by mouth daily.   Yes Historical Provider, MD  nitroGLYCERIN (NITROSTAT) 0.4 MG SL tablet Place 0.4 mg under the tongue every 5 (five) minutes as needed for chest pain.   Yes Historical Provider, MD  nizatidine (AXID) 150 MG capsule Take 150 mg by mouth at bedtime.   Yes Historical Provider, MD  Omega-3 Fatty Acids (SALMON OIL-1000 PO) Take 2,000 Units by mouth 2 (two) times daily.   Yes Historical Provider, MD  phenytoin (DILANTIN) 100 MG ER capsule Take 100 mg by mouth every 8 (eight) hours.   Yes  Historical Provider, MD  predniSONE (DELTASONE) 5 MG tablet Take 5 mg by mouth daily.    Yes Historical Provider, MD  rosuvastatin (CRESTOR) 5 MG tablet Take 5 mg by mouth every other day.    Yes Historical Provider, MD  tacrolimus (PROGRAF) 1 MG capsule Take 2 mg by mouth 2 (two) times daily.    Yes Historical Provider, MD  vitamin C (ASCORBIC ACID) 500 MG tablet Take 500 mg by mouth 2 (two) times daily.    Yes Historical Provider, MD    Current Medications Scheduled Meds: . amiodarone  200 mg Oral QPM  . antiseptic oral rinse  7 mL Mouth Rinse BID  . azaTHIOprine  75 mg Oral Daily  . calcitRIOL  0.5 mcg Oral Q M,W,F-HD  . [START ON 12/29/2013] ferric gluconate (FERRLECIT/NULECIT) IV  62.5 mg Intravenous Q Wed-HD  . gabapentin  300 mg Oral QHS  . heparin  5,000 Units Subcutaneous 3 times per day  . hydrocortisone sod succinate (SOLU-CORTEF) inj  50 mg Intravenous 4 times per day  . lanthanum  1,000 mg Oral TID WC  . latanoprost  1 drop Both Eyes QHS  . levothyroxine  225 mcg Oral QAC breakfast  . morphine  15 mg Oral Q12H  . multivitamin  1 tablet Oral Daily  . phenytoin  100 mg Oral 3 times per day  . polycarbophil  625 mg Oral Daily  . rosuvastatin  5 mg Oral QODAY  . sodium chloride  3 mL Intravenous Q12H  . tacrolimus  2 mg Oral BID  . vitamin C  500 mg Oral BID   Continuous Infusions: . sodium chloride 1,000 mL (12/27/13 0018)   PRN Meds:.acetaminophen, dextrose, loperamide, morphine injection, nitroGLYCERIN  CBC  Recent Labs Lab 12/22/13 1904 12/26/13 1348 12/26/13 1404 12/27/13 0305  WBC 6.6 12.6*  --  11.6*  NEUTROABS 5.0 10.5*  --   --   HGB 12.0* 10.9* 12.2* 10.0*  HCT 36.2* 33.3* 36.0* 31.0*  MCV 98.9 99.1  --  98.7  PLT 255 223  --  453   Basic Metabolic Panel  Recent Labs Lab 12/22/13 1904 12/26/13 1404 12/27/13 0305  NA 141 137 138  K 3.7 4.3 5.0  CL 95* 96 94*  CO2 34*  --  27  GLUCOSE 102* 90 91  BUN 14 41* 52*  CREATININE 3.14* 4.90*  5.91*  CALCIUM 9.4  --  9.2    Physical Exam  Blood pressure 78/43, pulse 60, temperature 98.2 F (36.8 C), temperature source Axillary, resp. rate 16, height 5' 10.5" (1.791 m), weight 61.6 kg (135 lb 12.9 oz), SpO2 98.00%. GEN: groggy, NAD ENT: NCAT. Fair dentition EYES: EOMI CV: RRR, nl s1s2 PULM: CTAB ABD: s/nt/nd.  +BS SKIN: no rashes EXT:No LEE NEURO: unable to move leg   A/P 1. ESRD: Cont on MWF tentatively, but biopsy more important.  Good clearances of late.  K and HCO3 acceptable.  Vol ok. 2. HTN/Vol: Patient has mild chronic hypotension. EDW has been stable. Keep at that for now. 3. Anemia: Patient is not on ESA therapy nor will be started given the concern for malignancy. He is iron replete.  Continue maintenance weekly iron 4. MBD: Indices are all at goal on the outpatient scheduled regimen. No need to make changes.  Lanthanum, Calcitriol 5. Functioning Pancreatic Transplant: cont outpatient immunosuppression of tacrolimus, azathioprine, prednisone 6. Lower extremity paralysis with thoracic paraspinous mass and questionable hepatic metastases: Needle biopsy pending. On high-dose corticosteroids.  Pearson Grippe MD 12/27/2013, 10:01 AM

## 2013-12-27 NOTE — Procedures (Signed)
I was present at this dialysis session. I have reviewed the session itself and made appropriate changes. Under EDW, challenge 2kg, BP permitting.   Pearson Grippe  MD 12/27/2013, 2:43 PM

## 2013-12-27 NOTE — Consult Note (Signed)
Chief Complaint: Chief Complaint  Patient presents with  . Fatigue  wt loss Back pain weakness  Referring Physician(s): Dr Thereasa Solo  History of Present Illness: [name] is a 53 y.o. 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 Secondary hyperparathyroidism, renal     In setting of ESRD.  [name] Hypothyroidism   . Automatic implantable cardiac defibrillator in situ     CRT-ICD   . Hypertension   . Nonischemic cardiomyopathy     2D-echocardiogram (08/2007) - LV EF 45%, akinesis of inferoseptal wall, inferior wall,   . ESRD on hemodialysis Started age 84yo    on MWF schedule. Previous failed renal transplant 1998 at [facility] (with rejection in 1998). 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 Resumed HD 10/2006.   [name] DM type 1 (diabetes mellitus, type 1) DX: age 73yo    previously well controlled with Aic 5 (12/2008)  . Intracranial hemorrhage     History of. On prophylactic dilantin.  . 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  . Hyperlipidemia   . Hemopericardium     History of in setting of failed radiogrequency ablation for Vtach  . Anemia of chronic disease     BL Hgb 11-13. In setting of ESRD.   [name] Peptic ulcer disease     Unknown history, no records per [facility].   . Cholelithiasis     Noted at least since 01/2006. Asymptomatic.  . Erectile dysfunction   . Peripheral autonomic neuropathy due to diabetes mellitus   . GERD (gastroesophageal reflux disease)   .  Glaucoma   . Squamous cell skin cancer, finger     History of. Marginal resection in 05/2008 with recurrent infection/ lytic lesion involving distal phalanx  . Toe amputation status     Ischemic fourth and fifith toe left (03/2009), history of previous 1,2,3 toe  amputations.   [name] MRSA (methicillin resistant staph aureus) culture positive     Verify type - Per medical history form dated 02/13/10.  [name] Poor circulation     per medical history form dated 02/13/10.  . Ventricular tachycardia     appropriate ATP and Shocks 12/12  . RIATA LEAD     EXTERNALIZATION 12/12 (sk) // high RV threshold  . S/P BKA (below knee amputation)     right  . Arthritis   . Blood transfusion without reported diagnosis   . Heart murmur   . Coronary artery disease   . ICD (implantable cardiac defibrillator) in place   . Legally blind   . Arteriovenous graft for hemodialysis in place, primary 2009    left leg, had a previous graft in right arm that occulded  . Chronic cholecystitis with calculus s/p lap chole Feb 2014 03/11/2012    Past Surgical History  Procedure Laterality Date  . Cardiac defibrillator placement  11/05    St Jude  . Transplant pancreatic allograft  2002  . Toe amputation  03/2009    left 4th, 5th toes. Previous 1,2, 3  toe left.  . Kidney transplant  2002  . Above knee leg amputation  11/09/10    Right AKA  . Ablation      2006  . Cholecystectomy N/A 04/16/2012    Procedure: LAPAROSCOPIC CHOLECYSTECTOMY WITH INTRAOPERATIVE CHOLANGIOGRAM;  Surgeon: Adin Hector, MD;  Location: [facility];  Service: General;  Laterality: N/A;  . Coronary artery bypass graft    . Vascular surgery    . Eye surgery    . Insert / replace / remove pacemaker      Allergies: Ceftriaxone sodium; Dilaudid; Protamine; Rocephin; Avelox; Ciprofloxacin; and Moxifloxacin  Medications: Prior to Admission medications   Medication Sig Start Date End Date Taking? Authorizing Provider  acetaminophen (TYLENOL) 500 MG  tablet Take 1,000 mg by mouth every 6 (six) hours as needed for mild pain.   Yes Historical Provider, MD  amiodarone (PACERONE) 200 MG tablet Take 200 mg by mouth every evening.    Yes Historical Provider, MD  azaTHIOprine (IMURAN) 50 MG tablet Take 75 mg by mouth daily.  12/07/11  Yes Historical Provider, MD  bimatoprost (LUMIGAN) 0.03 % ophthalmic solution Place 1 drop into both eyes at bedtime.    Yes Historical Provider, MD  Calcium Polycarbophil (EQUALACTIN PO) Take 2 tablets by mouth 2 (two) times daily.   Yes Historical Provider, MD  carvedilol (COREG) 3.125 MG tablet Take 3.125 mg by mouth See admin instructions. Takes after dialysis on Monday, Wednesday, Friday, and Saturday   Yes Historical Provider, MD  gabapentin (NEURONTIN) 100 MG capsule Take 300 mg by mouth at bedtime.  09/09/12  Yes Historical Provider, MD  HYDROcodone-acetaminophen (LORTAB) 10-500 MG per tablet Take 0.5 tablets by mouth 2 (two) times daily as needed for pain. 12/22/13  Yes Mercedes Strupp Camprubi-Soms, PA-C  lanthanum (FOSRENOL) 1000 MG chewable tablet Chew 1,000 mg by mouth 3 (three) times daily with meals. And with snacks   Yes Historical Provider, MD  levothyroxine (SYNTHROID, LEVOTHROID) 75 MCG tablet Take 225 mcg by mouth daily.  12/31/11  Yes Historical Provider, MD  loperamide (IMODIUM) 2 MG capsule Take 1 mg by mouth 2 (two) times daily as needed for diarrhea or loose stools.    Yes Historical Provider, MD  Lutein 20 MG TABS Take 1 tablet by mouth every evening.    Yes Historical Provider, MD  multivitamin (RENA-VIT) TABS tablet Take 1 tablet by mouth daily.   Yes Historical Provider, MD  nitroGLYCERIN (NITROSTAT) 0.4 MG SL tablet Place 0.4 mg under the tongue every 5 (five) minutes as needed for chest pain.   Yes Historical Provider, MD  nizatidine (AXID) 150 MG capsule Take 150 mg by mouth at bedtime.   Yes Historical Provider, MD  Omega-3 Fatty Acids (SALMON OIL-1000 PO) Take 2,000 Units by mouth 2 (two)  times daily.   Yes Historical Provider, MD  phenytoin (DILANTIN) 100 MG ER capsule Take 100 mg by mouth every 8 (eight) hours.   Yes Historical Provider, MD  predniSONE (DELTASONE) 5 MG tablet Take 5 mg by mouth daily.    Yes Historical Provider, MD  rosuvastatin (CRESTOR) 5 MG tablet Take 5 mg by mouth every other day.    Yes Historical Provider, MD  tacrolimus (PROGRAF) 1 MG capsule Take 2 mg by mouth 2 (two) times daily.    Yes Historical Provider, MD  vitamin C (ASCORBIC ACID) 500 MG tablet Take 500 mg by mouth 2 (two) times daily.    Yes Historical Provider, MD    Family History  Problem Relation Age of Onset  . Hypertension Brother   . Hypertension Sister   . Coronary artery disease Mother     x 5 vessel graft in her 48s  . Hypertension Mother   . Prostate cancer Father   . Hypertension Father   . Cancer Father     Prostate  . Hypertension Brother     History   Social History  . Marital Status: Married    Spouse Name: N/A    Number of Children: 0  . Years of Education: 12th grade   Occupational History  . UNEMPLOYED    Social History Main Topics  . Smoking status: Never Smoker   . Smokeless tobacco: Never Used  . 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 Drug Use: No  . Sexual Activity: None   Other Topics Concern  . None   Social History Narrative   The patient lives in [place] with his wife and stepdaughter.  He denies any tobacco use.  Does have a history of  previous heavy alcohol use, but has been clean since 1987 and denies any illicit drug use.   He is a retired Conservation officer, nature.     Review of Systems: A 12 point ROS discussed and pertinent positives are indicated in the HPI above.  All other systems are negative.  Review of Systems  Constitutional: Positive for fever, activity change, appetite change, fatigue and unexpected weight change.  Respiratory: Negative for cough and shortness of breath.   Cardiovascular:  Negative for chest pain.  Gastrointestinal: Negative for abdominal pain.  Musculoskeletal: Positive for back pain.  Psychiatric/Behavioral: Negative for behavioral problems and confusion.    Vital Signs: BP 76/44  Pulse 63  Temp(Src) 99.4 F (37.4 C) (Oral)  Resp 13  Ht 5' 10.5" (1.791 m)  Wt 64.5 kg (142 lb 3.2 oz)  BMI 20.11 kg/m2  SpO2 100%  Physical Exam  Constitutional: He is oriented to person, place, and time.  Eyes:  Blind  Cardiovascular: Normal rate and regular rhythm.   No murmur heard. Pulmonary/Chest: Effort normal and breath sounds normal. He has no wheezes.  Abdominal: Soft. Bowel sounds are normal. There is no tenderness.  Musculoskeletal: Normal range of motion.  Pt with Rt AKA  Neurological: He is alert and oriented to person, place, and time.  Skin: Skin is warm and dry.  Psychiatric: He has a normal mood and affect. His behavior is normal. Judgment and thought content normal.    Imaging: Dg Chest 2 View  12/22/2013   CLINICAL DATA:  53 year old male with chest pain for 1 month. Associated left lower extremity numbness and weakness. Initial encounter.  EXAM: CHEST  2 VIEW  COMPARISON:  11/23/2013 and earlier.  FINDINGS: Stable right chest cardiac AICD. Stable cardiomegaly and mediastinal contours. Visualized tracheal air column is within normal limits. Multilevel chronic left rib fractures. No pneumothorax, pulmonary edema, pleural effusion or confluent pulmonary opacity. Osteopenia. No acute osseous abnormality identified.  IMPRESSION: No acute cardiopulmonary abnormality.   Electronically Signed   By: Lars Pinks M.D.   On: 12/22/2013 20:22   Ct Chest W Contrast  12/26/2013   ADDENDUM REPORT: 12/26/2013 18:02  ADDENDUM: Addendum for further clarification.  The right paraspinal lesion demonstrates a significant soft tissue component which extends superiorly, adjacent to the right posterior spinous process at T1, where it measures approximately 2.5 x 3.3 cm  (series 21/image 4).  Inferiorly/posteriorly, the tumor extends along the  posterior spinous process at T3 (series 201/image 10), with suspected pathologic fracture (series 223/image 67).  The differential for this lesion includes malignant nerve sheath tumors given the general location. However, the unusual appearance, as well as the liver lesions, raise the possibility that this is an osseous/extraosseous metastasis from an unknown primary.  The portion of the lesion which is adjacent to the posterior spinous processes at T1 or T3 may provide a convenient location for percutaneous sampling.   Electronically Signed   By: Julian Hy M.D.   On: 12/26/2013 18:02   12/26/2013   CLINICAL DATA:  Chest pain. Evaluate for intra-abdominal abscess or vascular issue. Evaluate for epidural abscess, discitis, or cord compression. History of right above knee amputation, CAD, kidney transplant. Now with ESRD on dialysis.  EXAM: CT CHEST, ABDOMEN, AND PELVIS WITH CONTRAST  TECHNIQUE: Multidetector CT imaging of the chest, abdomen and pelvis was performed following the standard protocol during bolus administration of intravenous contrast.  CONTRAST:  165mL OMNIPAQUE IOHEXOL 300 MG/ML  SOLN  COMPARISON:  [place] CT abdomen dated 11/12/2011. [facility] CTA chest dated 06/18/2011. [facility] CT abdomen pelvis dated 12/03/2008.  FINDINGS: CT CHEST FINDINGS  Mildly dependent atelectasis in the lung bases. Trace bilateral pleural effusions. No suspicious pulmonary nodules. Mild emphysematous changes. No pneumothorax.  Visualized thyroid is heterogeneous.  Cardiomegaly. No pericardial effusion. Coronary atherosclerosis. Atherosclerotic calcifications of the aortic arch. Right subclavian ICD.  Small mediastinal lymph nodes which do not meet both CT size criteria.  Mild wall thickening involving the mid/ distal esophagus, nonspecific.  Right paraspinal soft tissue mass, measuring at least 1.9 x 3.3 x 1.9 cm (series 201/images 9-12),  with associated destruction/pathologic fracture of the right T2 transverse process (series 222/image 63). Lesion is posterior to the right lung apex but favored to be extrapleural in location. Additional abnormal soft tissue likely extends posterior to the right T2 transverse process (series 21/image 7).  Degenerative changes of the thoracic spine. Multiple old left rib fracture deformities.  CT ABDOMEN AND PELVIS FINDINGS  Hepatobiliary: 3.6 x 6.0 cm peripherally enhancing lesion in the medial segment left hepatic lobe (series 21/image 58), new. 3.9 x 4.6 cm peripherally enhancing low-density lesion in the posterior segment right hepatic lobe (series 21/image 26), new. Given the perispinal finding, these are suspicious for necrotic metastases. Abscesses are also possible.  Background heterogeneous perfusion of the liver.  Status post cholecystectomy. No intrahepatic or extrahepatic ductal dilatation.  Pancreas: Parenchymal atrophy.  Spleen: Within normal limits.  Adrenals/Urinary Tract: Adrenal glands are unremarkable.  Status post right nephrectomy. Native left renal atrophy with multiple probable acquired renal cysts. Transplant kidney in the left lower quadrant with additional small cysts. No hydronephrosis.  Bladder is thick-walled although underdistended.  Stomach/Bowel: Stomach is unremarkable.  No evidence of bowel obstruction.  Extensive colonic diverticulosis, without associated inflammatory changes.  Vascular/Lymphatic: Atherosclerotic calcifications of the abdominal aorta and branch vessels.  No suspicious abdominopelvic lymphadenopathy.  Reproductive: Prostate is grossly unremarkable.  Other: No abdominopelvic ascites.  Musculoskeletal: Degenerative changes of the lumbar spine.  IMPRESSION: Right paraspinal soft tissue mass, measuring at least 3.3 cm, worrisome for primary or metastatic malignancy. Associated osseous destruction/pathologic fracture of the right T2 transverse process.  Two hepatic  lesions measuring up to 6.0 cm, worrisome for metastases, less likely abscesses.  Additional ancillary findings as above.  Electronically Signed: By: Julian Hy M.D. On: 12/26/2013 17:44   Ct Thoracic Spine W Contrast  12/26/2013   CLINICAL DATA:  Back pain. Evaluate  for epidural abscess, discitis, or cord compression.  EXAM: CT THORACIC SPINE WITH CONTRAST  TECHNIQUE: Multidetector CT imaging of the thoracic spine was performed using the standard protocol following bolus administration of intravenous contrast. Multiplanar CT image reconstructions were also generated.  CONTRAST:  163mL OMNIPAQUE IOHEXOL 300 MG/ML  SOLN  COMPARISON:  None.  FINDINGS: Right paraspinal tumor is present, centered along the right T2 transverse process, with associated pathologic fracture/osseous destruction (series 206/image 19).  Tumor extends superiorly adjacent to the right T1 posterior spinous process (series 207/image 1), where measures approximately 2.7 x 3.2 cm.  Tumor extends laterally/anteriorly along the posterior right lung apex (series 207/image 13-20), where it measures approximately 1.9 x 3.5 cm.  Tumor extends posteriorly cyst inferiorly along the T3 posterior spinous process (series 207/image 10), where measures approximately 3.7 x 4.3 cm, with associated pathologic fracture (series 206/images 21-22).  Although difficult to definitively state on CT, there is no convincing tumor extension into the spinal canal (series 210/image 27).  This appearance favors osseous/extraosseous metastases, less likely malignant nerve sheath tumor.  Degenerative changes of the lower cervical spine.  Exaggerated thoracic kyphosis.  Mild multilevel degenerative changes of the thoracic spine. Vertebral body heights are maintained.  Spinal canal remains patent.  Renal osteodystrophy.  IMPRESSION: Right paraspinal tumor along T1-3, as described above. This appearance favors osseous/extraosseous metastases, less likely malignant nerve  sheath tumor.  Associated osseous destruction with pathologic fracture involving the right T2 transverse process and T3 posterior spinous process.  No definite extension into the spinal canal.   Electronically Signed   By: Julian Hy M.D.   On: 12/26/2013 18:13   Ct Lumbar Spine W Contrast  12/26/2013   CLINICAL DATA:  Back pain, weakness. Evaluate for epidural abscess, discitis, or cord compression.  EXAM: CT LUMBAR SPINE WITH CONTRAST  TECHNIQUE: Multidetector CT imaging of the lumbar spine was performed with intravenous contrast administration. Multiplanar CT image reconstructions were also generated.  CONTRAST:  194mL OMNIPAQUE IOHEXOL 300 MG/ML  SOLN  COMPARISON:  None.  FINDINGS: Five lumbar type vertebral bodies.  Normal lumbar lordosis.  No evidence of fracture or dislocation. Vertebral body heights are maintained.  Mild multilevel degenerative changes.  Spinal canal is widely patent.  Small posterior disc bulges at L3-4, L4-5, and L5-S1 (series 217/ image 41).  Mild lateral recess stenosis with facet arthropathy at L4-5 and L5-S1.  No evidence of epidural abscess or discitis osteomyelitis.  Liver lesion in the posterior segment right hepatic lobe (series 227/image 48), better visualized on CT abdomen/pelvis.  IMPRESSION: Small posterior disc bulges at L3-4, L4-5, and L5-S1.  Spinal canal remains patent.  No evidence of epidural abscess or discitis osteomyelitis.   Electronically Signed   By: Julian Hy M.D.   On: 12/26/2013 18:21   Ct Abdomen Pelvis W Contrast  12/26/2013   ADDENDUM REPORT: 12/26/2013 18:02  ADDENDUM: Addendum for further clarification.  The right paraspinal lesion demonstrates a significant soft tissue component which extends superiorly, adjacent to the right posterior spinous process at T1, where it measures approximately 2.5 x 3.3 cm (series 21/image 4).  Inferiorly/posteriorly, the tumor extends along the posterior spinous process at T3 (series 201/image 10), with  suspected pathologic fracture (series 223/image 67).  The differential for this lesion includes malignant nerve sheath tumors given the general location. However, the unusual appearance, as well as the liver lesions, raise the possibility that this is an osseous/extraosseous metastasis from an unknown primary.  The portion of the lesion which  is adjacent to the posterior spinous processes at T1 or T3 may provide a convenient location for percutaneous sampling.   Electronically Signed   By: Julian Hy M.D.   On: 12/26/2013 18:02   12/26/2013   CLINICAL DATA:  Chest pain. Evaluate for intra-abdominal abscess or vascular issue. Evaluate for epidural abscess, discitis, or cord compression. History of right above knee amputation, CAD, kidney transplant. Now with ESRD on dialysis.  EXAM: CT CHEST, ABDOMEN, AND PELVIS WITH CONTRAST  TECHNIQUE: Multidetector CT imaging of the chest, abdomen and pelvis was performed following the standard protocol during bolus administration of intravenous contrast.  CONTRAST:  169mL OMNIPAQUE IOHEXOL 300 MG/ML  SOLN  COMPARISON:  [place] CT abdomen dated 11/12/2011. [facility] CTA chest dated 06/18/2011. [facility] CT abdomen pelvis dated 12/03/2008.  FINDINGS: CT CHEST FINDINGS  Mildly dependent atelectasis in the lung bases. Trace bilateral pleural effusions. No suspicious pulmonary nodules. Mild emphysematous changes. No pneumothorax.  Visualized thyroid is heterogeneous.  Cardiomegaly. No pericardial effusion. Coronary atherosclerosis. Atherosclerotic calcifications of the aortic arch. Right subclavian ICD.  Small mediastinal lymph nodes which do not meet both CT size criteria.  Mild wall thickening involving the mid/ distal esophagus, nonspecific.  Right paraspinal soft tissue mass, measuring at least 1.9 x 3.3 x 1.9 cm (series 201/images 9-12), with associated destruction/pathologic fracture of the right T2 transverse process (series 222/image 63). Lesion is posterior to the  right lung apex but favored to be extrapleural in location. Additional abnormal soft tissue likely extends posterior to the right T2 transverse process (series 21/image 7).  Degenerative changes of the thoracic spine. Multiple old left rib fracture deformities.  CT ABDOMEN AND PELVIS FINDINGS  Hepatobiliary: 3.6 x 6.0 cm peripherally enhancing lesion in the medial segment left hepatic lobe (series 21/image 58), new. 3.9 x 4.6 cm peripherally enhancing low-density lesion in the posterior segment right hepatic lobe (series 21/image 26), new. Given the perispinal finding, these are suspicious for necrotic metastases. Abscesses are also possible.  Background heterogeneous perfusion of the liver.  Status post cholecystectomy. No intrahepatic or extrahepatic ductal dilatation.  Pancreas: Parenchymal atrophy.  Spleen: Within normal limits.  Adrenals/Urinary Tract: Adrenal glands are unremarkable.  Status post right nephrectomy. Native left renal atrophy with multiple probable acquired renal cysts. Transplant kidney in the left lower quadrant with additional small cysts. No hydronephrosis.  Bladder is thick-walled although underdistended.  Stomach/Bowel: Stomach is unremarkable.  No evidence of bowel obstruction.  Extensive colonic diverticulosis, without associated inflammatory changes.  Vascular/Lymphatic: Atherosclerotic calcifications of the abdominal aorta and branch vessels.  No suspicious abdominopelvic lymphadenopathy.  Reproductive: Prostate is grossly unremarkable.  Other: No abdominopelvic ascites.  Musculoskeletal: Degenerative changes of the lumbar spine.  IMPRESSION: Right paraspinal soft tissue mass, measuring at least 3.3 cm, worrisome for primary or metastatic malignancy. Associated osseous destruction/pathologic fracture of the right T2 transverse process.  Two hepatic lesions measuring up to 6.0 cm, worrisome for metastases, less likely abscesses.  Additional ancillary findings as above.  Electronically  Signed: By: Julian Hy M.D. On: 12/26/2013 17:44    Labs:  CBC:  Recent Labs  12/22/13 1904 12/26/13 1348 12/26/13 1404 12/27/13 0305  WBC 6.6 12.6*  --  11.6*  HGB 12.0* 10.9* 12.2* 10.0*  HCT 36.2* 33.3* 36.0* 31.0*  PLT 255 223  --  203    COAGS:  Recent Labs  12/27/13 0305  INR 1.45  APTT 40*    BMP:  Recent Labs  12/22/13 1904 12/26/13 1404 12/27/13 0305  NA 141 137 138  K 3.7 4.3 5.0  CL 95* 96 94*  CO2 34*  --  27  GLUCOSE 102* 90 91  BUN 14 41* 52*  CALCIUM 9.4  --  9.2  CREATININE 3.14* 4.90* 5.91*  GFRNONAA 21*  --  10*  GFRAA 24*  --  11*    LIVER FUNCTION TESTS:  Recent Labs  12/22/13 1904 12/27/13 0305  BILITOT 0.3 0.4  AST 15 203*  ALT 9 85*  ALKPHOS 182* 357*  PROT 7.4 6.7  ALBUMIN 2.8* 2.2*    TUMOR MARKERS: No results found for this basename: AFPTM, CEA, CA199, CHROMGRNA,  in the last 8760 hours  Assessment and Plan: Back pain Wt loss Weakness Work up shows liver lesion and paraspinal mass Now scheduled of bx liver lesion in Rad 10/27 Pt aware of procedure benefits and risks and agreeable to proceed Consent signed andin chart  Thank you for this interesting consult.  I greatly enjoyed meeting Hector Shade and look forward to participating in their care.     I spent a total of 20 minutes face to face in clinical consultation, greater than 50% of which was counseling/coordinating care for liver lesion bx  Signed: Shantai Tiedeman A 12/27/2013, 3:17 PM

## 2013-12-27 NOTE — Progress Notes (Signed)
Per MD, as long as pt sbp >75 keep in UF for HD

## 2013-12-27 NOTE — Progress Notes (Signed)
Pt alert, oriented x4 and vss for his baseline after HD. Report called to [place], [place] RN.

## 2013-12-27 NOTE — Progress Notes (Signed)
INITIAL NUTRITION ASSESSMENT  DOCUMENTATION CODES Per approved criteria  -Not Applicable   INTERVENTION: Advance diet as medically appropriate, add supplements once/as able RD to follow for nutrition care plan  NUTRITION DIAGNOSIS: Increased nutrient needs related to ESRD on HD, skin integrity as evidenced by estimated nutrition needs  Goal: Pt to meet >/= 90% of their estimated nutrition needs   Monitor:  PO & supplemental intake, weight, labs, I/O's  Reason for Assessment: Low Braden  53 y.o. male  Admitting Dx: Mass of thoracic vertebra  ASSESSMENT: 53 y.o. Male with PMH of ESRD on dialysis (M/W/F), pancreas and kidney transplantation, CHF, ICD, hypertension, GERD, hypothyroidism, hyperlipidemia, who presented with back pain.   CT scan demonstrates a paraspinous mass with involvement of the T1-T2 and T3 vertebrae.  RD spoke with pt's wife at bedside; reports pt's appetite was fair PTA; usually consumes "2 good meals per day;" wt has been stable; pt with increased nutrient needs given chronic illness; does not care for Nepro Shakes as it causes him diarrhea; amenable to trying Resource Breeze during hospitalization; RD to monitor, add once/as able.   Low braden score places patient at risk for further skin breakdown.  No muscle or subcutaneous fat depletion noticed.  Height: Ht Readings from Last 1 Encounters:  12/26/13 5' 10.5" (1.791 m)    Weight: Wt Readings from Last 1 Encounters:  12/27/13 135 lb 12.9 oz (61.6 kg)    Ideal Body Weight: 156 lb -- adjusted for BKA  % Ideal Body Weight: 86%  Wt Readings from Last 10 Encounters:  12/27/13 135 lb 12.9 oz (61.6 kg)  10/26/13 127 lb 9.6 oz (57.879 kg)  09/28/12 132 lb (59.875 kg)  05/12/12 130 lb (58.968 kg)  04/23/12 135 lb (61.236 kg)  04/14/12 135 lb 14.4 oz (61.644 kg)  03/24/12 135 lb 9.3 oz (61.5 kg)  03/11/12 135 lb 9.6 oz (61.508 kg)  10/22/11 136 lb 12.8 oz (62.052 kg)  07/08/11 131 lb (59.421 kg)     Usual Body Weight: 127-132 lb  % Usual Body Weight: 94-102%  BMI:  20.8 kg/m2 -- adjusted for BKA  Estimated Nutritional Needs: Kcal: 1850-2050 Protein: 90-100 gm Fluid: 1200 ml  Skin: Stage I pressure ulcer to sacrum  Diet Order: NPO  EDUCATION NEEDS: -No education needs identified at this time   Intake/Output Summary (Last 24 hours) at 12/27/13 1311 Last data filed at 12/27/13 1100  Gross per 24 hour  Intake    253 ml  Output      0 ml  Net    253 ml    Labs:   Recent Labs Lab 12/22/13 1904 12/26/13 1404 12/27/13 0305  NA 141 137 138  K 3.7 4.3 5.0  CL 95* 96 94*  CO2 34*  --  27  BUN 14 41* 52*  CREATININE 3.14* 4.90* 5.91*  CALCIUM 9.4  --  9.2  MG 2.1  --   --   GLUCOSE 102* 90 91    CBG (last 3)   Recent Labs  12/27/13 0818  GLUCAP 85    Scheduled Meds: . amiodarone  200 mg Oral QPM  . antiseptic oral rinse  7 mL Mouth Rinse BID  . azaTHIOprine  75 mg Oral Daily  . calcitRIOL  0.5 mcg Oral Q M,W,F-HD  . [START ON 12/29/2013] ferric gluconate (FERRLECIT/NULECIT) IV  62.5 mg Intravenous Q Wed-HD  . gabapentin  300 mg Oral QHS  . heparin  5,000 Units Subcutaneous 3 times per day  .  hydrocortisone sod succinate (SOLU-CORTEF) inj  50 mg Intravenous 4 times per day  . lanthanum  1,000 mg Oral TID WC  . latanoprost  1 drop Both Eyes QHS  . levothyroxine  225 mcg Oral QAC breakfast  . morphine  15 mg Oral Q12H  . multivitamin  1 tablet Oral Daily  . phenytoin  100 mg Oral 3 times per day  . polycarbophil  625 mg Oral Daily  . rosuvastatin  5 mg Oral QODAY  . sodium chloride  3 mL Intravenous Q12H  . tacrolimus  2 mg Oral BID  . vitamin C  500 mg Oral BID    Continuous Infusions: . sodium chloride 1,000 mL (12/27/13 0018)    Past Medical History  Diagnosis Date  . Secondary hyperparathyroidism, renal     In setting of ESRD.  [name] Hypothyroidism   . Automatic implantable cardiac defibrillator in situ     CRT-ICD   . Hypertension    . Nonischemic cardiomyopathy     2D-echocardiogram (08/2007) - LV EF 45%, akinesis of inferoseptal wall, inferior wall,   . ESRD on hemodialysis Started age 38yo    on MWF schedule. Previous failed renal transplant 1998 at [facility] (with rejection in 1998). 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 Resumed HD 10/2006.   [name] DM type 1 (diabetes mellitus, type 1) DX: age 56yo    previously well controlled with Aic 5 (12/2008)  . Intracranial hemorrhage     History of. On prophylactic dilantin.  . 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  . Hyperlipidemia   . Hemopericardium     History of in setting of failed radiogrequency ablation for Vtach  . Anemia of chronic disease     BL Hgb 11-13. In setting of ESRD.   [name] Peptic ulcer disease     Unknown history, no records per [employer].   . Cholelithiasis     Noted at least since 01/2006. Asymptomatic.  . Erectile dysfunction   . Peripheral autonomic neuropathy due to diabetes mellitus   . GERD (gastroesophageal reflux disease)   . Glaucoma   . Squamous cell skin cancer, finger     History of. Marginal resection in 05/2008 with recurrent infection/ lytic lesion involving distal phalanx  . Toe amputation status     Ischemic fourth and fifith toe left (03/2009), history of previous 1,2,3 toe  amputations.   [name] MRSA (methicillin resistant staph aureus) culture positive     Verify type - Per medical history form dated 02/13/10.  [name] Poor circulation     per medical history form dated 02/13/10.  . Ventricular tachycardia     appropriate ATP and Shocks 12/12  . RIATA LEAD     EXTERNALIZATION 12/12 (sk) // high RV threshold  . S/P BKA (below knee amputation)     right  . Arthritis   . Blood transfusion without reported diagnosis   . Heart murmur   . Coronary artery disease   . ICD (implantable cardiac defibrillator) in  place   . Legally blind   . Arteriovenous graft for hemodialysis in place, primary 2009    left leg, had a previous graft in right arm that occulded  . Chronic cholecystitis with calculus s/p lap chole Feb 2014 03/11/2012    Past Surgical History  Procedure Laterality Date  . Cardiac defibrillator placement  11/05    St Jude  .  Transplant pancreatic allograft  2002  . Toe amputation  03/2009    left 4th, 5th toes. Previous 1,2, 3 toe left.  . Kidney transplant  2002  . Above knee leg amputation  11/09/10    Right AKA  . Ablation      2006  . Cholecystectomy N/A 04/16/2012    Procedure: LAPAROSCOPIC CHOLECYSTECTOMY WITH INTRAOPERATIVE CHOLANGIOGRAM;  Surgeon: Adin Hector, MD;  Location: [facility];  Service: General;  Laterality: N/A;  . Coronary artery bypass graft    . Vascular surgery    . Eye surgery    . Insert / replace / remove pacemaker      Arthur Holms, RD, LDN Pager #: [phone] After-Hours Pager #: [phone]

## 2013-12-28 DIAGNOSIS — R29818 Other symptoms and signs involving the nervous system: Secondary | ICD-10-CM

## 2013-12-28 DIAGNOSIS — E119 Type 2 diabetes mellitus without complications: Secondary | ICD-10-CM

## 2013-12-28 DIAGNOSIS — T86890 Other transplanted tissue rejection: Secondary | ICD-10-CM

## 2013-12-28 DIAGNOSIS — Z992 Dependence on renal dialysis: Secondary | ICD-10-CM

## 2013-12-28 DIAGNOSIS — E785 Hyperlipidemia, unspecified: Secondary | ICD-10-CM

## 2013-12-28 DIAGNOSIS — Z940 Kidney transplant status: Secondary | ICD-10-CM

## 2013-12-28 DIAGNOSIS — Z95810 Presence of automatic (implantable) cardiac defibrillator: Secondary | ICD-10-CM

## 2013-12-28 DIAGNOSIS — I950 Idiopathic hypotension: Secondary | ICD-10-CM

## 2013-12-28 DIAGNOSIS — I5022 Chronic systolic (congestive) heart failure: Secondary | ICD-10-CM

## 2013-12-28 LAB — COMPREHENSIVE METABOLIC PANEL
ALBUMIN: 2.1 g/dL — AB (ref 3.5–5.2)
ALT: 102 U/L — ABNORMAL HIGH (ref 0–53)
ANION GAP: 17 — AB (ref 5–15)
AST: 123 U/L — ABNORMAL HIGH (ref 0–37)
Alkaline Phosphatase: 379 U/L — ABNORMAL HIGH (ref 39–117)
BUN: 32 mg/dL — ABNORMAL HIGH (ref 6–23)
CALCIUM: 8.8 mg/dL (ref 8.4–10.5)
CO2: 26 mEq/L (ref 19–32)
CREATININE: 3.89 mg/dL — AB (ref 0.50–1.35)
Chloride: 97 mEq/L (ref 96–112)
GFR calc Af Amer: 19 mL/min — ABNORMAL LOW (ref >90)
GFR calc non Af Amer: 16 mL/min — ABNORMAL LOW (ref >90)
Glucose, Bld: 140 mg/dL — ABNORMAL HIGH (ref 70–99)
Potassium: 4.4 mEq/L (ref 3.7–5.3)
Sodium: 140 mEq/L (ref 137–147)
TOTAL PROTEIN: 6.3 g/dL (ref 6.0–8.3)
Total Bilirubin: 0.3 mg/dL (ref 0.3–1.2)

## 2013-12-28 LAB — CBC
HCT: 30.1 % — ABNORMAL LOW (ref 39.0–52.0)
Hemoglobin: 9.8 g/dL — ABNORMAL LOW (ref 13.0–17.0)
MCH: 32.1 pg (ref 26.0–34.0)
MCHC: 32.6 g/dL (ref 30.0–36.0)
MCV: 98.7 fL (ref 78.0–100.0)
PLATELETS: 204 10*3/uL (ref 150–400)
RBC: 3.05 MIL/uL — AB (ref 4.22–5.81)
RDW: 16.6 % — AB (ref 11.5–15.5)
WBC: 14.4 10*3/uL — ABNORMAL HIGH (ref 4.0–10.5)

## 2013-12-28 LAB — LIPID PANEL
Cholesterol: 87 mg/dL (ref 0–200)
HDL: 55 mg/dL (ref >39)
LDL Cholesterol: 22 mg/dL (ref 0–99)
Total CHOL/HDL Ratio: 1.6 RATIO
Triglycerides: 50 mg/dL (ref <150)
VLDL: 10 mg/dL (ref 0–40)

## 2013-12-28 LAB — PROTIME-INR
INR: 1.38 (ref 0.00–1.49)
PROTHROMBIN TIME: 17.1 s — AB (ref 11.6–15.2)

## 2013-12-28 LAB — TSH: TSH: 5.15 u[IU]/mL — ABNORMAL HIGH (ref 0.350–4.500)

## 2013-12-28 LAB — GLUCOSE, CAPILLARY
GLUCOSE-CAPILLARY: 135 mg/dL — AB (ref 70–99)
GLUCOSE-CAPILLARY: 128 mg/dL — AB (ref 70–99)

## 2013-12-28 LAB — T4, FREE: FREE T4: 1.42 ng/dL (ref 0.80–1.80)

## 2013-12-28 LAB — PHENYTOIN LEVEL, TOTAL

## 2013-12-28 MED ORDER — BOOST / RESOURCE BREEZE PO LIQD: 1.0000 | Freq: Every day | ORAL | Status: DC

## 2013-12-28 MED ORDER — HEPARIN SODIUM (PORCINE) 5000 UNIT/ML IJ SOLN: 5000.0000 [IU] | Freq: Three times a day (TID) | INTRAMUSCULAR | Status: DC

## 2013-12-28 MED ORDER — OXYCODONE HCL 5 MG PO TABS
5.0000 mg | ORAL_TABLET | Freq: Once | ORAL | Status: AC
Start: 2013-12-28 — End: 2013-12-28
  Administered 2013-12-28: 5 mg via ORAL
  Filled 2013-12-28: qty 1

## 2013-12-28 MED ORDER — LIDOCAINE HCL (PF) 1 % IJ SOLN
INTRAMUSCULAR | Status: AC
  Filled 2013-12-28: qty 10

## 2013-12-28 MED ORDER — MIDODRINE HCL 2.5 MG PO TABS
2.5000 mg | ORAL_TABLET | Freq: Three times a day (TID) | ORAL | Status: DC
  Administered 2013-12-29 – 2013-12-30 (×5): 2.5 mg via ORAL
  Filled 2013-12-28 (×8): qty 1

## 2013-12-28 MED ORDER — MORPHINE SULFATE ER 15 MG PO TBCR
15.0000 mg | EXTENDED_RELEASE_TABLET | Freq: Two times a day (BID) | ORAL | Status: DC
  Administered 2013-12-30 – 2014-01-03 (×7): 15 mg via ORAL
  Filled 2013-12-28 (×10): qty 1

## 2013-12-28 NOTE — Progress Notes (Signed)
Utilization Review Completed.[name] T10/27/2015  

## 2013-12-28 NOTE — Progress Notes (Signed)
Admit: 12/26/2013 LOS: 2  6M ESRD MWF [facility] via LLE AVG and Functioning Pancreatic Transplant w/ hx/o CAD/CABG admitted with LE paralysis and high thoracic paraspinous mass w/ question of liver mets.  Subjective:  For liver biopsy today HD yesterday; had mild ASx hypotension; kept even In better spirits this AM No new issues  10/26 0701 - 10/27 0700 In: 203 [I.V.:203] Out: -88   Filed Weights   12/27/13 1350 12/27/13 1700 12/28/13 0414  Weight: 64.5 kg (142 lb 3.2 oz) 65 kg (143 lb 4.8 oz) 63.5 kg (139 lb 15.9 oz)    Scheduled Meds: . amiodarone  200 mg Oral QPM  . antiseptic oral rinse  7 mL Mouth Rinse BID  . azaTHIOprine  75 mg Oral Daily  . calcitRIOL  0.5 mcg Oral Q M,W,F-HD  . [START ON 12/29/2013] ferric gluconate (FERRLECIT/NULECIT) IV  62.5 mg Intravenous Q Wed-HD  . gabapentin  300 mg Oral QHS  . heparin  5,000 Units Subcutaneous 3 times per day  . hydrocortisone sod succinate (SOLU-CORTEF) inj  50 mg Intravenous 4 times per day  . lanthanum  1,000 mg Oral TID WC  . latanoprost  1 drop Both Eyes QHS  . levothyroxine  225 mcg Oral QAC breakfast  . morphine  15 mg Oral Q12H  . multivitamin  1 tablet Oral Daily  . phenytoin  100 mg Oral 3 times per day  . polycarbophil  625 mg Oral Daily  . sodium chloride  3 mL Intravenous Q12H  . tacrolimus  2 mg Oral BID  . vitamin C  500 mg Oral BID   Continuous Infusions:  PRN Meds:.acetaminophen, dextrose, loperamide, morphine injection, nitroGLYCERIN  Current Labs: reviewed    Physical Exam:  Blood pressure 88/43, pulse 62, temperature 98.9 F (37.2 C), temperature source Oral, resp. rate 22, height 5' 10.5" (1.791 m), weight 63.5 kg (139 lb 15.9 oz), SpO2 97.00%. GEN: groggy, NAD  ENT: NCAT. Fair dentition  EYES: EOMI  CV: RRR, nl s1s2  PULM: CTAB  ABD: s/nt/nd. +BS  SKIN: no rashes  EXT:No LEE  NEURO: unable to move leg  Outpt HD Orders Unit: [place] Days: MWF  Time: 4.5h  Dialyzer: F160  EDW:  58kg  K/Ca: 2K/2.25Ca  Access: AVG L Leg  Needle Size: 15g  BFR/DFR: 400/a1.5  UF Proflie: None  VDRA: Calcitriol 0.89mcg qMWF  EPO: None  IV Fe: 50mg  qWk  Heparin: 2000 units IVP qTx  Most Recent Phos / PTH: 3.9 / 321 in 11/2013  Most Recent TSAT: 33% 11/2013  Most Recent eKT/V: 1.85 12/08/13  Treatment Adherence: Excellent  Assessment and Plan 1. Lower Ext Paralysis w/ Thoracic Paraspinous Mass and ? Liver Metastases: for liver Bx today.  On corticosteroids. 2. ESRD: On MWF Schedule.   3. Volume Status and Hypotension: tolerates mild hypotension. Challenged EDW but weights not corresponding.  Goal UF 2L next Tx.  I wonder if some of this hypotension is related to spinal cord injury 4. Anemia:Patient is not on ESA therapy nor will be started given the concern for malignancy. He is iron replete. 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 No need to make changes. Lanthanum, Calcitriol 6. Functioning Pancreatic Transplant: cont outpatient immunosuppression of tacrolimus, azathioprine, prednisone   Pearson Grippe MD 12/28/2013, 10:34 AM   Recent Labs Lab 12/22/13 1904 12/26/13 1404 12/27/13 0305 12/28/13 0319  NA 141 137 138 140  K 3.7 4.3 5.0 4.4  CL 95* 96 94* 97  CO2 34*  --  27 26  GLUCOSE 102* 90 91 140*  BUN 14 41* 52* 32*  CREATININE 3.14* 4.90* 5.91* 3.89*  CALCIUM 9.4  --  9.2 8.8    Recent Labs Lab 12/22/13 1904 12/26/13 1348 12/26/13 1404 12/27/13 0305 12/28/13 0319  WBC 6.6 12.6*  --  11.6* 14.4*  NEUTROABS 5.0 10.5*  --   --   --   HGB 12.0* 10.9* 12.2* 10.0* 9.8*  HCT 36.2* 33.3* 36.0* 31.0* 30.1*  MCV 98.9 99.1  --  98.7 98.7  PLT 255 223  --  203 204

## 2013-12-28 NOTE — Progress Notes (Signed)
[facility] TEAM 1 - Stepdown/ICU TEAM Progress Note  TYREZ BERRIOS HUD:[id number] DOB: November 05, 1960 DOA: 12/26/2013 PCP: Maris Berger  Admit HPI / Brief Narrative: 53 y.o. WM PMHx S/P right AKA, S/P amputation all left foot metatarsals, ESRD on HD (M/W/F), Hx pancreas and kidney transplantation (on immunosuppressants), CHF, ICD, hypertension, GERD, hypothyroidism, and hyperlipidemia, who presented with back pain that started 6 weeks prior. He was prescribed a muscle relaxant by his renal doctor, which did not help. His back pain started radiating to the R arm. 11 days prior patient was evaluated by an Orthopedic surgeon in [place]. He was treated with tapering dose steroids which also did not help. On 10/22, patient started having severe weakness of his left leg (he s/p R AKA). He could not walk anymore. He went back to his Orthopedic Surgeon again who planned to do a CT scan, but this had not been done yet. Because of worsening symptoms, patient came to the hospital.  A CT scan was performed which demonstrated a paraspinous mass with involvement of the T1-T2 and T3 vertebrae. There was no clear evidence of an epidural tumor but there did appear to be pathologic involvement of the endplates of C5-Y8. The patient also had some lesions in his liver.      HPI/Subjective: 10/27 States (3) days ago lost feeling below waist. Lost ability to move lower extremities. Positive loss bowel/Bladder control.   Assessment/Plan: Paraspinal mass T1-T3  -complete paralysis of lower extremities with a sensory level at approximately T3 or T4 - lesions at T1-T2 and T3 area with epidural and paraspinous tumor  - high dose steroids - surgical intervention not felt to offer benefit  - pain control: Patient becoming hypotensive with combination MS contin and morphine prn;  HOLD MS CONTIN FOR SBP<110  - IR to do a needle biopsy of his liver on 10/28   Hypotension  - multifactorial likely, due to ESRD, possible  adrenal insuff, hypovolemia, and pain meds - well tolerated at present - follow trend  -Continue Solu-Cortef 50 mg QID -Start Midodrine 2.5 mg TID  Chronic systolic CHF  - 2-D echo 5/02/7 showed EF of 45% - volume control per HD    ESRD on HD M/W/F  - Dialysis on Monday/Wednesday/Friday - Nephrology following   Hx of pancreas/kidney transplantation - renal failed - pancreas functional  - continue home immunosuppressive meds   Subacute Hypothyroidism?  - 10/27 TSH= 5.1  -obtain Free T4  S/P AICD  -stable no issues  DM  -CBG well controlled  - 9/8 HgA1C= 4.9  HLD  -Obtain Lipid Panel  Anemia of chronic kidney disease    Code Status: FULL  Family Communication: spoke w/ wife at bedside  Disposition Plan: SDU     Consultants: Dr Pearson Grippe (Nephrology ) Dr Kristeen Miss (Neurosurgery ) Rd Monia Sabal (IR)   Procedure/Significant Events: Liver biopsy pending   Culture NA  Antibiotics: NA  DVT prophylaxis: Subcutaneous heparin; on 11/27 discontinued after midnight, will need to restart after biopsy   Devices NA  LINES / TUBES:      Continuous Infusions:   Objective: VITAL SIGNS: Temp: 98.9 F (37.2 C) (10/27 0750) Temp Source: Oral (10/27 0750) BP: 88/43 mmHg (10/27 0750) Pulse Rate: 63 (10/27 0750) SPO2; FIO2:   Intake/Output Summary (Last 24 hours) at 12/28/13 0755 Last data filed at 12/27/13 1959  Gross per 24 hour  Intake    203 ml  Output    -88 ml  Net  291 ml     Exam: General: A/O x 4, NAD,    No acute respiratory distress Lungs: Clear to auscultation bilaterally without wheezes or crackles Cardiovascular: Regular rate and rhythm positive Systolic Murmur, AICD in Rt Chest wall no pain or sign Infx, (-) gallop or rub normal S1 and S2 Abdomen: Nontender, nondistended, soft, bowel sounds positive, no rebound, no ascites, no appreciable mass Extremities: No significant cyanosis, clubbing, or edema bilateral lower  extremities. Rt AKA (-) SSx of decubitus, LLE all metatarsals amputated 2dary to DM per Pt. Neurologic; CN II-XII intact, Tongue/Uvula midline, Strength bilat UE 5/5 sensation intact, Bilat LE strength 0/5, loss all sensation   Data Reviewed: Basic Metabolic Panel:  Recent Labs Lab 12/22/13 1904 12/26/13 1404 12/27/13 0305 12/28/13 0319  NA 141 137 138 140  K 3.7 4.3 5.0 4.4  CL 95* 96 94* 97  CO2 34*  --  27 26  GLUCOSE 102* 90 91 140*  BUN 14 41* 52* 32*  CREATININE 3.14* 4.90* 5.91* 3.89*  CALCIUM 9.4  --  9.2 8.8  MG 2.1  --   --   --    Liver Function Tests:  Recent Labs Lab 12/22/13 1904 12/27/13 0305 12/28/13 0319  AST 15 203* 123*  ALT 9 85* 102*  ALKPHOS 182* 357* 379*  BILITOT 0.3 0.4 0.3  PROT 7.4 6.7 6.3  ALBUMIN 2.8* 2.2* 2.1*    Recent Labs Lab 12/22/13 1904  LIPASE 37   No results found for this basename: AMMONIA,  in the last 168 hours CBC:  Recent Labs Lab 12/22/13 1904 12/26/13 1348 12/26/13 1404 12/27/13 0305 12/28/13 0319  WBC 6.6 12.6*  --  11.6* 14.4*  NEUTROABS 5.0 10.5*  --   --   --   HGB 12.0* 10.9* 12.2* 10.0* 9.8*  HCT 36.2* 33.3* 36.0* 31.0* 30.1*  MCV 98.9 99.1  --  98.7 98.7  PLT 255 223  --  203 204   Cardiac Enzymes: No results found for this basename: CKTOTAL, CKMB, CKMBINDEX, TROPONINI,  in the last 168 hours BNP (last 3 results) No results found for this basename: PROBNP,  in the last 8760 hours CBG:  Recent Labs Lab 12/27/13 0818  GLUCAP 85    Recent Results (from the past 240 hour(s))  MRSA PCR SCREENING     Status: None   Collection Time    12/26/13 11:15 PM      Result Value Ref Range Status   MRSA by PCR NEGATIVE  NEGATIVE Final   Comment:            The GeneXpert MRSA Assay (FDA     approved for NASAL specimens     only), is one component of a     comprehensive MRSA colonization     surveillance program. It is not     intended to diagnose MRSA     infection nor to guide or     monitor  treatment for     MRSA infections.     Studies:  Recent x-ray studies have been reviewed in detail by the Attending Physician  Scheduled Meds:  Scheduled Meds: . amiodarone  200 mg Oral QPM  . antiseptic oral rinse  7 mL Mouth Rinse BID  . azaTHIOprine  75 mg Oral Daily  . calcitRIOL  0.5 mcg Oral Q M,W,F-HD  . [START ON 12/29/2013] ferric gluconate (FERRLECIT/NULECIT) IV  62.5 mg Intravenous Q Wed-HD  . gabapentin  300 mg Oral QHS  .  heparin  5,000 Units Subcutaneous 3 times per day  . hydrocortisone sod succinate (SOLU-CORTEF) inj  50 mg Intravenous 4 times per day  . lanthanum  1,000 mg Oral TID WC  . latanoprost  1 drop Both Eyes QHS  . levothyroxine  225 mcg Oral QAC breakfast  . morphine  15 mg Oral Q12H  . multivitamin  1 tablet Oral Daily  . phenytoin  100 mg Oral 3 times per day  . polycarbophil  625 mg Oral Daily  . sodium chloride  3 mL Intravenous Q12H  . tacrolimus  2 mg Oral BID  . vitamin C  500 mg Oral BID    Time spent on care of this patient: 40 mins   Allie Bossier , MD   [facility] Office  [phone] Pager - [phone]  On-Call/Text Page:      [email or web address]      password [email or web address]  If 7PM-7AM, please contact night-coverage [email or web address] Password [email or web address] 12/28/2013, 7:55 AM   LOS: 2 days

## 2013-12-28 NOTE — Progress Notes (Signed)
Patient ID: [name], male   DOB: [birth year], 53 y.o.   MRN: [id number]   Pt was scheduled for liver lesion biopsy in Radiology today Pt DID receive am dose Heparin injection- (although ordered held)  Now we will reschedule to 10/28 am Have spoken to RN We will HOLD 10/27 1000 pm dose and 10/28 Hep injections (per Dr Kathlene Cote) Plan for bx 10/28 am  RN aware She will inform pt See new orders for 10/28 am

## 2013-12-28 NOTE — Progress Notes (Signed)
Patient ID: [name], male   DOB: 04-Jul-1960, 53 y.o.   MRN: [id number]

## 2013-12-28 NOTE — Progress Notes (Signed)
Patient ID: [name], male   DOB: 1961-01-07, 53 y.o.   MRN: [id number]  Contacted by Dr. Sherral Hammers to discuss case.  Unfortunately, patient received 5000 U SQ heparin this morning.   Regarding biopsy site, I would still favor sampling one of the liver masses given much more suspicious imaging features for malignancy.  The upper thoracic paraspinal soft tissue abnormality is much more vague by CT, with no discrete enhancing mass visualized.  Although this may also be a malignant process vs infectious, I think initial tissue yield is higher in one of the liver lesions.  Would also ultimately suggest PET scan if biopsy positive for malignancy.    Will proceed with ultrasound guided liver biopsy in AM after holding SQ heparin tonight and in AM.

## 2013-12-29 ENCOUNTER — Inpatient Hospital Stay (HOSPITAL_COMMUNITY): Payer: Medicare Other

## 2013-12-29 LAB — CBC
HCT: 30.7 % — ABNORMAL LOW (ref 39.0–52.0)
HEMATOCRIT: 31.6 % — AB (ref 39.0–52.0)
HEMOGLOBIN: 9.9 g/dL — AB (ref 13.0–17.0)
HEMOGLOBIN: 10.4 g/dL — AB (ref 13.0–17.0)
MCH: 31.9 pg (ref 26.0–34.0)
MCH: 32 pg (ref 26.0–34.0)
MCHC: 32.2 g/dL (ref 30.0–36.0)
MCHC: 32.9 g/dL (ref 30.0–36.0)
MCV: 99 fL (ref 78.0–100.0)
MCV: 97.2 fL (ref 78.0–100.0)
Platelets: 214 10*3/uL (ref 150–400)
Platelets: 229 10*3/uL (ref 150–400)
RBC: 3.1 MIL/uL — AB (ref 4.22–5.81)
RBC: 3.25 MIL/uL — AB (ref 4.22–5.81)
RDW: 16.4 % — ABNORMAL HIGH (ref 11.5–15.5)
RDW: 16.5 % — ABNORMAL HIGH (ref 11.5–15.5)
WBC: 15 10*3/uL — ABNORMAL HIGH (ref 4.0–10.5)
WBC: 15.4 10*3/uL — ABNORMAL HIGH (ref 4.0–10.5)

## 2013-12-29 LAB — RENAL FUNCTION PANEL
Albumin: 2.1 g/dL — ABNORMAL LOW (ref 3.5–5.2)
Anion gap: 17 — ABNORMAL HIGH (ref 5–15)
BUN: 67 mg/dL — AB (ref 6–23)
CO2: 25 meq/L (ref 19–32)
CREATININE: 6.1 mg/dL — AB (ref 0.50–1.35)
Calcium: 8.6 mg/dL (ref 8.4–10.5)
Chloride: 93 mEq/L — ABNORMAL LOW (ref 96–112)
GFR calc non Af Amer: 9 mL/min — ABNORMAL LOW (ref >90)
GFR, EST AFRICAN AMERICAN: 11 mL/min — AB (ref >90)
GLUCOSE: 132 mg/dL — AB (ref 70–99)
PHOSPHORUS: 4.9 mg/dL — AB (ref 2.3–4.6)
Potassium: 5.4 mEq/L — ABNORMAL HIGH (ref 3.7–5.3)
Sodium: 135 mEq/L — ABNORMAL LOW (ref 137–147)

## 2013-12-29 LAB — GLUCOSE, CAPILLARY: Glucose-Capillary: 139 mg/dL — ABNORMAL HIGH (ref 70–99)

## 2013-12-29 MED ORDER — MIDAZOLAM HCL 2 MG/2ML IJ SOLN
INTRAMUSCULAR | Status: AC
  Filled 2013-12-29: qty 4

## 2013-12-29 MED ORDER — SODIUM CHLORIDE 0.9 % IV SOLN
INTRAVENOUS | Status: AC | PRN
  Administered 2013-12-29: 10 mL/h via INTRAVENOUS

## 2013-12-29 MED ORDER — MIDAZOLAM HCL 2 MG/2ML IJ SOLN
INTRAMUSCULAR | Status: AC | PRN
  Administered 2013-12-29: 0.5 mg via INTRAVENOUS

## 2013-12-29 MED ORDER — FENTANYL CITRATE 0.05 MG/ML IJ SOLN
INTRAMUSCULAR | Status: AC
  Filled 2013-12-29: qty 2

## 2013-12-29 MED ORDER — LIDOCAINE HCL (PF) 1 % IJ SOLN
INTRAMUSCULAR | Status: AC
  Filled 2013-12-29: qty 10

## 2013-12-29 MED ORDER — MIDODRINE HCL 5 MG PO TABS
ORAL_TABLET | ORAL | Status: AC
  Filled 2013-12-29: qty 1

## 2013-12-29 MED ORDER — FENTANYL CITRATE 0.05 MG/ML IJ SOLN
INTRAMUSCULAR | Status: AC | PRN
  Administered 2013-12-29: 25 ug via INTRAVENOUS

## 2013-12-29 MED ORDER — ACETAMINOPHEN 500 MG PO TABS
500.0000 mg | ORAL_TABLET | Freq: Three times a day (TID) | ORAL | Status: DC | PRN
  Filled 2013-12-29 (×2): qty 1

## 2013-12-29 NOTE — Progress Notes (Signed)
[place] TEAM 1 - Stepdown/ICU TEAM Progress Note  [name] WUJ:[id number] DOB: 07/24/1960 DOA: 12/26/2013 PCP: Maris Berger  Admit HPI / Brief Narrative: 53 y.o. 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  He was prescribed a muscle relaxant by his renal doctor, which did not help. His back pain started radiating to the R arm. 11 days prior patient was evaluated by an Orthopedic surgeon in [place]. He was treated with tapering dose steroids which also did not help. On 10/22, patient started having severe weakness of his left leg (he s/p R AKA). He could not walk anymore. He went back to his Orthopedic Surgeon again who planned to do a CT scan, but this had not been done yet. Because of worsening symptoms, patient came to the hospital.   A CT scan was performed which demonstrated a paraspinous mass with involvement of the T1-T2 and T3 vertebrae. There was no clear evidence of an epidural tumor but there did appear to be pathologic involvement of the endplates of N5-A2. The patient also had some lesions in his liver.   HPI/Subjective: Patient is seen on the hemodialysis unit.  He reports no significant pain in his abdomen post biopsy.  He denies shortness of breath chest pain nausea vomiting fevers or chills.  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-T2 and T3 area with epidural and paraspinous tumor - high dose steroids - surgical intervention not felt to offer benefit  - pain control: MS contin and morphine prn - IR completed needle core biopsy today - Path pending  Hypotension  - multifactorial likely, due to ESRD, possible adrenal insuff, hypovolemia, and pain meds -  well tolerated at present - follow trend - continue midodrine  Chronic systolic CHF - 2-D echo 1/30/8 showed EF of 45% - volume control per HD - euvolemic at present  ESRD on HD - Dialysis on Monday/Wednesday/Friday - Nephrology following   Hx of pancreas/kidney transplantation - renal failed - pancreas functional  - continue home immunosuppressive meds   Hypothyroidism - TSH was 1.02 on 03/03/11 - TSH 10/27 5.1 -  free T4 normal therefore continue home replacement regimen  S/P AICD  DM CBG well controlled - 9/8 HgA1C= 4.9  HLD  Anemia of chronic kidney disease Hemoglobin is stable  Code Status: FULL Family Communication: No family present at time of exam today in hemodialysis unit  Disposition Plan: SDU   Consultants: Nephrology  Neurosurgery  IR  Procedures: 10/28 - US guided biopsy of left hepatic lesion - 3 cores obtained  Antibiotics: none  DVT prophylaxis: SQ heparin   Objective: Blood pressure 92/45, pulse 60, temperature 98.6 F (37 C), temperature source Oral, resp. rate 12, height 5' 10.5" (1.791 m), weight 63.4 kg (139 lb 12.4 oz), SpO2 99.00%. No intake or output data in the 24 hours ending 12/29/13 1527  General: No acute respiratory distress Lungs: Clear to auscultation bilaterally without wheezes or crackles Cardiovascular: Regular rate and rhythm without gallop or rub, 2/6 systolic M  Abdomen: Nontender, nondistended, soft, bowel sounds positive, no rebound, no ascites, no appreciable mass - Band-Aid at biopsy site without evidence of blood loss Extremities: No significant cyanosis, clubbing, or edema L LE   Data Reviewed: Basic Metabolic Panel:  Recent Labs Lab 12/22/13 1904 12/26/13 1404 12/27/13 0305 12/28/13 0319 12/29/13 1252  NA 141 137 138 140 135*  K 3.7 4.3 5.0 4.4 5.4*  CL 95* 96 94* 97 93*  CO2 34*  --  27 26 25   GLUCOSE 102* 90 91 140* 132*  BUN 14 41* 52* 32* 67*  CREATININE 3.14* 4.90* 5.91* 3.89* 6.10*  CALCIUM 9.4   --  9.2 8.8 8.6  MG 2.1  --   --   --   --   PHOS  --   --   --   --  4.9*    Liver Function Tests:  Recent Labs Lab 12/22/13 1904 12/27/13 0305 12/28/13 0319 12/29/13 1252  AST 15 203* 123*  --   ALT 9 85* 102*  --   ALKPHOS 182* 357* 379*  --   BILITOT 0.3 0.4 0.3  --   PROT 7.4 6.7 6.3  --   ALBUMIN 2.8* 2.2* 2.1* 2.1*    Recent Labs Lab 12/22/13 1904  LIPASE 37   Coags:  Recent Labs Lab 12/27/13 0305 12/28/13 0319  INR 1.45 1.38    Recent Labs Lab 12/27/13 0305  APTT 40*    CBC:  Recent Labs Lab 12/22/13 1904 12/26/13 1348 12/26/13 1404 12/27/13 0305 12/28/13 0319 12/29/13 1252  WBC 6.6 12.6*  --  11.6* 14.4* 15.4*  NEUTROABS 5.0 10.5*  --   --   --   --   HGB 12.0* 10.9* 12.2* 10.0* 9.8* 9.9*  HCT 36.2* 33.3* 36.0* 31.0* 30.1* 30.7*  MCV 98.9 99.1  --  98.7 98.7 99.0  PLT 255 223  --  203 204 229   CBG:  Recent Labs Lab 12/27/13 0818 12/28/13 0752 12/28/13 1242 12/29/13 0818  GLUCAP 85 135* 128* 139*    Recent Results (from the past 240 hour(s))  MRSA PCR SCREENING     Status: None   Collection Time    12/26/13 11:15 PM      Result Value Ref Range Status   MRSA by PCR NEGATIVE  NEGATIVE Final   Comment:            The GeneXpert MRSA Assay (FDA     approved for NASAL specimens     only), is one component of a     comprehensive MRSA colonization     surveillance program. It is not     intended to diagnose MRSA     infection nor to guide or     monitor treatment for     MRSA infections.     Studies:  Recent x-ray studies have been reviewed in detail by the Attending Physician  Scheduled Meds:  Scheduled Meds: . amiodarone  200 mg Oral QPM  . antiseptic oral rinse  7 mL Mouth Rinse BID  . azaTHIOprine  75 mg Oral Daily  . calcitRIOL  0.5 mcg Oral Q M,W,F-HD  . ferric gluconate (FERRLECIT/NULECIT) IV  62.5 mg Intravenous Q Wed-HD  . gabapentin  300 mg Oral QHS  . hydrocortisone sod succinate (SOLU-CORTEF) inj  50 mg  Intravenous 4 times per day  . lanthanum  1,000 mg Oral TID WC  . latanoprost  1 drop Both Eyes QHS  . levothyroxine  225 mcg Oral QAC breakfast  . lidocaine (PF)      . midazolam      . midodrine      . midodrine  2.5 mg Oral TID WC  . morphine  15 mg Oral  Q12H  . multivitamin  1 tablet Oral Daily  . phenytoin  100 mg Oral 3 times per day  . polycarbophil  625 mg Oral Daily  . sodium chloride  3 mL Intravenous Q12H  . tacrolimus  2 mg Oral BID  . vitamin C  500 mg Oral BID    Time spent on care of this patient: 25 mins   [facility] T , MD   [facility] Office  [phone] Pager - Text Page per Shea Evans as per below:  On-Call/Text Page:      [email or web address]      password [email or web address]  If 7PM-7AM, please contact night-coverage [email or web address] Password [email or web address] 12/29/2013, 3:27 PM   LOS: 3 days

## 2013-12-29 NOTE — Progress Notes (Signed)
CCMD called to make nurse aware that pt had 8 beats of widened QRS. Strip was saved.

## 2013-12-29 NOTE — Procedures (Signed)
US guided biopsy of left hepatic lesion.  3 cores obtained.  No immediate complication.

## 2013-12-29 NOTE — Sedation Documentation (Signed)
Patient denies pain and is resting comfortably.  

## 2013-12-29 NOTE — Sedation Documentation (Signed)
Unable to waste Fentanyl in the Pyxis as discrepancy was created and resolved, but unable to waste med. Fentanyl 1.5 ml equals 42mcg wasted in sink with P. Clois Dupes RN

## 2013-12-29 NOTE — Sedation Documentation (Signed)
States no feeling below chest. Is able to move arms but not legs. Is blind.

## 2013-12-29 NOTE — Procedures (Signed)
I was present at this dialysis session. I have reviewed the session itself and made appropriate changes.   Goal UF 3L.  SBP in 90s. AVG.    Pt s/p liver biopsy today.  No heparin in HD.   Pearson Grippe  MD 12/29/2013, 3:11 PM

## 2013-12-30 LAB — GLUCOSE, CAPILLARY
Glucose-Capillary: 127 mg/dL — ABNORMAL HIGH (ref 70–99)
Glucose-Capillary: 119 mg/dL — ABNORMAL HIGH (ref 70–99)

## 2013-12-30 MED ORDER — MIDODRINE HCL 2.5 MG PO TABS
2.5000 mg | ORAL_TABLET | Freq: Three times a day (TID) | ORAL | Status: DC
  Administered 2013-12-31 – 2014-01-03 (×12): 2.5 mg via ORAL
  Filled 2013-12-30 (×13): qty 1

## 2013-12-30 MED ORDER — LEVOTHYROXINE SODIUM 25 MCG PO TABS
225.0000 ug | ORAL_TABLET | Freq: Every day | ORAL | Status: DC
  Administered 2013-12-31 – 2014-01-03 (×4): 225 ug via ORAL
  Filled 2013-12-30 (×5): qty 1

## 2013-12-30 MED ORDER — LANTHANUM CARBONATE 500 MG PO CHEW
1000.0000 mg | CHEWABLE_TABLET | Freq: Three times a day (TID) | ORAL | Status: DC
  Administered 2013-12-31 – 2014-01-03 (×9): 1000 mg via ORAL
  Filled 2013-12-30 (×14): qty 2

## 2013-12-30 NOTE — Plan of Care (Signed)
Problem: Phase III Progression Outcomes Goal: Voiding independently Outcome: Not Applicable Date Met:  68/59/92 Pt Hemodialysis

## 2013-12-30 NOTE — Progress Notes (Signed)
PATIENT DETAILS Name: [name] Age: 53 y.o. Sex: male Date of Birth: [birth year] Admit Date: 12/26/2013 Admitting Physician Ivor Costa, MD [id number] M  Brief summary : 53 y.o. 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 He was being evaluated by his orthopedic surgeon who was planning on doing a CT scan. Unfortunately patient developed severe weakness in his left leg (he s/p R AKA). 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 Seen by neurosurgery, not felt to be a surgical candidate, underwent liver biopsy by interventional radiology.  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 IR performed Liver bx on 10/28, path pending.  Multiple liver masses: Likely metastatic deposits.  IR performed Liver bx on 10/28, path pending.   End-stage renal disease: On MWF Schedule. Nephrology following  Hx of pancreas/kidney transplantation - renal failed - pancreas functional: continue home immunosuppressive meds. Currently on IV Solu-Cortef, transition to prednisone when able.  Chronic systolic CHF: 2-D echo 0/35/4 showed EF of 45% - volume control per HD - euvolemic at present  Hypotension: multifactorial likely, due to ESRD, possible adrenal insuff, hypovolemia, and pain meds - well tolerated at present - follow trend - continue midodrine. Coreg continues to be on hold.    Hypothyroidism: Continue  levothyroxine.    History of ventricular tachycardia: Continue amiodarone, has AICD in place.   DM:CBG well controlled - 9/8 HgA1C= 4.9   Anemia of chronic kidney disease: Per nephrology   History of intracranial bleeding: On prophylactic Dilantin therapy   Legally blind  Disposition: Remain inpatient  Antibiotics:  None  DVT Prophylaxis: Begin SQ Heparin  Code Status:  DNR  Family Communication Mother at bedside  Procedures: 10/28 - US guided biopsy of left hepatic lesion - 3 cores obtained  CONSULTS:  Renal/Neurosurgery/IR   MEDICATIONS: Scheduled Meds: . amiodarone  200 mg Oral QPM  . antiseptic oral rinse  7 mL Mouth Rinse BID  . azaTHIOprine  75 mg Oral Daily  . calcitRIOL  0.5 mcg Oral Q M,W,F-HD  . ferric gluconate (FERRLECIT/NULECIT) IV  62.5 mg Intravenous Q Wed-HD  . gabapentin  300 mg Oral QHS  . hydrocortisone sod succinate (SOLU-CORTEF) inj  50 mg Intravenous 4 times per day  . lanthanum  1,000 mg Oral TID WC  . latanoprost  1 drop Both Eyes QHS  . levothyroxine  225 mcg Oral QAC breakfast  . midodrine  2.5 mg Oral TID WC  . morphine  15 mg Oral Q12H  . multivitamin  1 tablet Oral Daily  . phenytoin  100 mg Oral 3 times per day  . polycarbophil  625 mg Oral Daily  . sodium chloride  3 mL Intravenous Q12H  . tacrolimus  2 mg Oral BID  . vitamin C  500 mg Oral BID   Continuous Infusions:  PRN Meds:.acetaminophen, dextrose, loperamide, morphine injection, nitroGLYCERIN  Antibiotics: Anti-infectives   None       PHYSICAL EXAM: Vital signs in last 24 hours: Filed Vitals:  12/30/13 0026 12/30/13 0435 12/30/13 0752 12/30/13 1149  BP: 105/46 101/51 111/57 95/45  Pulse:  61 58 59  Temp: 98.5 F (36.9 C) 98.5 F (36.9 C) 98.5 F (36.9 C) 98.3 F (36.8 C)  TempSrc: Oral Oral Oral Oral  Resp:  16 19 18   Height:      Weight:  63 kg (138 lb 14.2 oz)    SpO2:  95% 96% 96%    Weight change: -0.1 kg (-3.5 oz) Filed Weights   12/29/13  1237 12/29/13 1638 12/30/13 0435  Weight: 63.4 kg (139 lb 12.4 oz) 61 kg (134 lb 7.7 oz) 63 kg (138 lb 14.2 oz)   Body mass index is 19.64 kg/(m^2).   Gen Exam: Awake and alert with clear speech.   Neck: Supple, No JVD.   Chest: B/L Clear.   CVS: S1 S2 Regular, no murmurs.  Abdomen: soft, BS +, non tender, non distended.  Extremities: s/p Right AKA, left leg-no edema Neurologic: Paraplegic Skin: No Rash.   Wounds: N/A.   Intake/Output from previous day:  Intake/Output Summary (Last 24 hours) at 12/30/13 1347 Last data filed at 12/30/13 1306  Gross per 24 hour  Intake      6 ml  Output   2381 ml  Net  -2375 ml     LAB RESULTS: CBC  Recent Labs Lab 12/26/13 1348 12/26/13 1404 12/27/13 0305 12/28/13 0319 12/29/13 1252 12/29/13 2354  WBC 12.6*  --  11.6* 14.4* 15.4* 15.0*  HGB 10.9* 12.2* 10.0* 9.8* 9.9* 10.4*  HCT 33.3* 36.0* 31.0* 30.1* 30.7* 31.6*  PLT 223  --  203 204 229 214  MCV 99.1  --  98.7 98.7 99.0 97.2  MCH 32.4  --  31.8 32.1 31.9 32.0  MCHC 32.7  --  32.3 32.6 32.2 32.9  RDW 16.5*  --  16.8* 16.6* 16.5* 16.4*  LYMPHSABS 0.7  --   --   --   --   --   MONOABS 1.3*  --   --   --   --   --   EOSABS 0.1  --   --   --   --   --   BASOSABS 0.0  --   --   --   --   --     Chemistries   Recent Labs Lab 12/26/13 1404 12/27/13 0305 12/28/13 0319 12/29/13 1252  NA 137 138 140 135*  K 4.3 5.0 4.4 5.4*  CL 96 94* 97 93*  CO2  --  27 26 25   GLUCOSE 90 91 140* 132*  BUN 41* 52* 32* 67*  CREATININE 4.90* 5.91* 3.89* 6.10*  CALCIUM  --  9.2 8.8 8.6    CBG:  Recent Labs Lab 12/27/13 0818 12/28/13 0752 12/28/13 1242 12/29/13 0818 12/30/13 0754  GLUCAP 85 135* 128* 139* 127*    GFR Estimated Creatinine Clearance: 12.2 ml/min (by C-G formula based on Cr of 6.1).  Coagulation profile  Recent Labs Lab 12/27/13 0305 12/28/13 0319  INR 1.45 1.38    Cardiac Enzymes No results found for this basename: CK, CKMB, TROPONINI, MYOGLOBIN,  in the  last 168 hours  No components found with this basename: POCBNP,  No results found for this basename: DDIMER,  in the last 72 hours No results found for this basename: HGBA1C,  in the last 72 hours  Recent Labs  12/28/13 0319  CHOL 87  HDL 55  LDLCALC 22  TRIG 50  CHOLHDL 1.6  Recent Labs  12/28/13 0319  TSH 5.150*   No results found for this basename: VITAMINB12, FOLATE, FERRITIN, TIBC, IRON, RETICCTPCT,  in the last 72 hours No results found for this basename: LIPASE, AMYLASE,  in the last 72 hours  Urine Studies No results found for this basename: UACOL, UAPR, USPG, UPH, UTP, UGL, UKET, UBIL, UHGB, UNIT, UROB, ULEU, UEPI, UWBC, URBC, UBAC, CAST, CRYS, UCOM, BILUA,  in the last 72 hours  MICROBIOLOGY: Recent Results (from the past 240 hour(s))  MRSA PCR SCREENING     Status: None   Collection Time    12/26/13 11:15 PM      Result Value Ref Range Status   MRSA by PCR NEGATIVE  NEGATIVE Final   Comment:            The GeneXpert MRSA Assay (FDA     approved for NASAL specimens     only), is one component of a     comprehensive MRSA colonization     surveillance program. It is not     intended to diagnose MRSA     infection nor to guide or     monitor treatment for     MRSA infections.    RADIOLOGY STUDIES/RESULTS: Dg Chest 2 View  12/22/2013   CLINICAL DATA:  53 year old male with chest pain for 1 month. Associated left lower extremity numbness and weakness. Initial encounter.  EXAM: CHEST  2 VIEW  COMPARISON:  11/23/2013 and earlier.  FINDINGS: Stable right chest cardiac AICD. Stable cardiomegaly and mediastinal contours. Visualized tracheal air column is within normal limits. Multilevel chronic left rib fractures. No pneumothorax, pulmonary edema, pleural effusion or confluent pulmonary opacity. Osteopenia. No acute osseous abnormality identified.  IMPRESSION: No acute cardiopulmonary abnormality.   Electronically Signed   By: Lars Pinks M.D.   On: 12/22/2013 20:22     Ct Chest W Contrast  12/26/2013   ADDENDUM REPORT: 12/26/2013 18:02  ADDENDUM: Addendum for further clarification.  The right paraspinal lesion demonstrates a significant soft tissue component which extends superiorly, adjacent to the right posterior spinous process at T1, where it measures approximately 2.5 x 3.3 cm (series 21/image 4).  Inferiorly/posteriorly, the tumor extends along the posterior spinous process at T3 (series 201/image 10), with suspected pathologic fracture (series 223/image 67).  The differential for this lesion includes malignant nerve sheath tumors given the general location. However, the unusual appearance, as well as the liver lesions, raise the possibility that this is an osseous/extraosseous metastasis from an unknown primary.  The portion of the lesion which is adjacent to the posterior spinous processes at T1 or T3 may provide a convenient location for percutaneous sampling.   Electronically Signed   By: Julian Hy M.D.   On: 12/26/2013 18:02   12/26/2013   CLINICAL DATA:  Chest pain. Evaluate for intra-abdominal abscess or vascular issue. Evaluate for epidural abscess, discitis, or cord compression. History of right above knee amputation, CAD, kidney transplant. Now with ESRD on dialysis.  EXAM: CT CHEST, ABDOMEN, AND PELVIS WITH CONTRAST  TECHNIQUE: Multidetector CT imaging of the chest, abdomen and pelvis was performed following the standard protocol during bolus administration of intravenous contrast.  CONTRAST:  158mL OMNIPAQUE IOHEXOL 300 MG/ML  SOLN  COMPARISON:  [place] CT abdomen dated 11/12/2011. [place] CTA chest dated 06/18/2011. [facility] CT abdomen pelvis dated 12/03/2008.  FINDINGS: CT CHEST FINDINGS  Mildly dependent atelectasis in the lung bases. Trace bilateral pleural effusions. No suspicious pulmonary nodules. Mild emphysematous changes. No  pneumothorax.  Visualized thyroid is heterogeneous.  Cardiomegaly. No pericardial effusion. Coronary  atherosclerosis. Atherosclerotic calcifications of the aortic arch. Right subclavian ICD.  Small mediastinal lymph nodes which do not meet both CT size criteria.  Mild wall thickening involving the mid/ distal esophagus, nonspecific.  Right paraspinal soft tissue mass, measuring at least 1.9 x 3.3 x 1.9 cm (series 201/images 9-12), with associated destruction/pathologic fracture of the right T2 transverse process (series 222/image 63). Lesion is posterior to the right lung apex but favored to be extrapleural in location. Additional abnormal soft tissue likely extends posterior to the right T2 transverse process (series 21/image 7).  Degenerative changes of the thoracic spine. Multiple old left rib fracture deformities.  CT ABDOMEN AND PELVIS FINDINGS  Hepatobiliary: 3.6 x 6.0 cm peripherally enhancing lesion in the medial segment left hepatic lobe (series 21/image 58), new. 3.9 x 4.6 cm peripherally enhancing low-density lesion in the posterior segment right hepatic lobe (series 21/image 26), new. Given the perispinal finding, these are suspicious for necrotic metastases. Abscesses are also possible.  Background heterogeneous perfusion of the liver.  Status post cholecystectomy. No intrahepatic or extrahepatic ductal dilatation.  Pancreas: Parenchymal atrophy.  Spleen: Within normal limits.  Adrenals/Urinary Tract: Adrenal glands are unremarkable.  Status post right nephrectomy. Native left renal atrophy with multiple probable acquired renal cysts. Transplant kidney in the left lower quadrant with additional small cysts. No hydronephrosis.  Bladder is thick-walled although underdistended.  Stomach/Bowel: Stomach is unremarkable.  No evidence of bowel obstruction.  Extensive colonic diverticulosis, without associated inflammatory changes.  Vascular/Lymphatic: Atherosclerotic calcifications of the abdominal aorta and branch vessels.  No suspicious abdominopelvic lymphadenopathy.  Reproductive: Prostate is grossly  unremarkable.  Other: No abdominopelvic ascites.  Musculoskeletal: Degenerative changes of the lumbar spine.  IMPRESSION: Right paraspinal soft tissue mass, measuring at least 3.3 cm, worrisome for primary or metastatic malignancy. Associated osseous destruction/pathologic fracture of the right T2 transverse process.  Two hepatic lesions measuring up to 6.0 cm, worrisome for metastases, less likely abscesses.  Additional ancillary findings as above.  Electronically Signed: By: Julian Hy M.D. On: 12/26/2013 17:44   Ct Thoracic Spine W Contrast  12/26/2013   CLINICAL DATA:  Back pain. Evaluate for epidural abscess, discitis, or cord compression.  EXAM: CT THORACIC SPINE WITH CONTRAST  TECHNIQUE: Multidetector CT imaging of the thoracic spine was performed using the standard protocol following bolus administration of intravenous contrast. Multiplanar CT image reconstructions were also generated.  CONTRAST:  173mL OMNIPAQUE IOHEXOL 300 MG/ML  SOLN  COMPARISON:  None.  FINDINGS: Right paraspinal tumor is present, centered along the right T2 transverse process, with associated pathologic fracture/osseous destruction (series 206/image 19).  Tumor extends superiorly adjacent to the right T1 posterior spinous process (series 207/image 1), where measures approximately 2.7 x 3.2 cm.  Tumor extends laterally/anteriorly along the posterior right lung apex (series 207/image 13-20), where it measures approximately 1.9 x 3.5 cm.  Tumor extends posteriorly cyst inferiorly along the T3 posterior spinous process (series 207/image 10), where measures approximately 3.7 x 4.3 cm, with associated pathologic fracture (series 206/images 21-22).  Although difficult to definitively state on CT, there is no convincing tumor extension into the spinal canal (series 210/image 27).  This appearance favors osseous/extraosseous metastases, less likely malignant nerve sheath tumor.  Degenerative changes of the lower cervical spine.   Exaggerated thoracic kyphosis.  Mild multilevel degenerative changes of the thoracic spine. Vertebral body heights are maintained.  Spinal canal remains patent.  Renal osteodystrophy.  IMPRESSION: Right  paraspinal tumor along T1-3, as described above. This appearance favors osseous/extraosseous metastases, less likely malignant nerve sheath tumor.  Associated osseous destruction with pathologic fracture involving the right T2 transverse process and T3 posterior spinous process.  No definite extension into the spinal canal.   Electronically Signed   By: Julian Hy M.D.   On: 12/26/2013 18:13   Ct Lumbar Spine W Contrast  12/26/2013   CLINICAL DATA:  Back pain, weakness. Evaluate for epidural abscess, discitis, or cord compression.  EXAM: CT LUMBAR SPINE WITH CONTRAST  TECHNIQUE: Multidetector CT imaging of the lumbar spine was performed with intravenous contrast administration. Multiplanar CT image reconstructions were also generated.  CONTRAST:  176mL OMNIPAQUE IOHEXOL 300 MG/ML  SOLN  COMPARISON:  None.  FINDINGS: Five lumbar type vertebral bodies.  Normal lumbar lordosis.  No evidence of fracture or dislocation. Vertebral body heights are maintained.  Mild multilevel degenerative changes.  Spinal canal is widely patent.  Small posterior disc bulges at L3-4, L4-5, and L5-S1 (series 217/ image 41).  Mild lateral recess stenosis with facet arthropathy at L4-5 and L5-S1.  No evidence of epidural abscess or discitis osteomyelitis.  Liver lesion in the posterior segment right hepatic lobe (series 227/image 48), better visualized on CT abdomen/pelvis.  IMPRESSION: Small posterior disc bulges at L3-4, L4-5, and L5-S1.  Spinal canal remains patent.  No evidence of epidural abscess or discitis osteomyelitis.   Electronically Signed   By: Julian Hy M.D.   On: 12/26/2013 18:21   Ct Abdomen Pelvis W Contrast  12/26/2013   ADDENDUM REPORT: 12/26/2013 18:02  ADDENDUM: Addendum for further clarification.   The right paraspinal lesion demonstrates a significant soft tissue component which extends superiorly, adjacent to the right posterior spinous process at T1, where it measures approximately 2.5 x 3.3 cm (series 21/image 4).  Inferiorly/posteriorly, the tumor extends along the posterior spinous process at T3 (series 201/image 10), with suspected pathologic fracture (series 223/image 67).  The differential for this lesion includes malignant nerve sheath tumors given the general location. However, the unusual appearance, as well as the liver lesions, raise the possibility that this is an osseous/extraosseous metastasis from an unknown primary.  The portion of the lesion which is adjacent to the posterior spinous processes at T1 or T3 may provide a convenient location for percutaneous sampling.   Electronically Signed   By: Julian Hy M.D.   On: 12/26/2013 18:02   12/26/2013   CLINICAL DATA:  Chest pain. Evaluate for intra-abdominal abscess or vascular issue. Evaluate for epidural abscess, discitis, or cord compression. History of right above knee amputation, CAD, kidney transplant. Now with ESRD on dialysis.  EXAM: CT CHEST, ABDOMEN, AND PELVIS WITH CONTRAST  TECHNIQUE: Multidetector CT imaging of the chest, abdomen and pelvis was performed following the standard protocol during bolus administration of intravenous contrast.  CONTRAST:  131mL OMNIPAQUE IOHEXOL 300 MG/ML  SOLN  COMPARISON:   CT abdomen dated 11/12/2011. [place] CTA chest dated 06/18/2011. [facility] CT abdomen pelvis dated 12/03/2008.  FINDINGS: CT CHEST FINDINGS  Mildly dependent atelectasis in the lung bases. Trace bilateral pleural effusions. No suspicious pulmonary nodules. Mild emphysematous changes. No pneumothorax.  Visualized thyroid is heterogeneous.  Cardiomegaly. No pericardial effusion. Coronary atherosclerosis. Atherosclerotic calcifications of the aortic arch. Right subclavian ICD.  Small mediastinal lymph nodes which do  not meet both CT size criteria.  Mild wall thickening involving the mid/ distal esophagus, nonspecific.  Right paraspinal soft tissue mass, measuring at least 1.9 x 3.3 x 1.9  cm (series 201/images 9-12), with associated destruction/pathologic fracture of the right T2 transverse process (series 222/image 63). Lesion is posterior to the right lung apex but favored to be extrapleural in location. Additional abnormal soft tissue likely extends posterior to the right T2 transverse process (series 21/image 7).  Degenerative changes of the thoracic spine. Multiple old left rib fracture deformities.  CT ABDOMEN AND PELVIS FINDINGS  Hepatobiliary: 3.6 x 6.0 cm peripherally enhancing lesion in the medial segment left hepatic lobe (series 21/image 58), new. 3.9 x 4.6 cm peripherally enhancing low-density lesion in the posterior segment right hepatic lobe (series 21/image 26), new. Given the perispinal finding, these are suspicious for necrotic metastases. Abscesses are also possible.  Background heterogeneous perfusion of the liver.  Status post cholecystectomy. No intrahepatic or extrahepatic ductal dilatation.  Pancreas: Parenchymal atrophy.  Spleen: Within normal limits.  Adrenals/Urinary Tract: Adrenal glands are unremarkable.  Status post right nephrectomy. Native left renal atrophy with multiple probable acquired renal cysts. Transplant kidney in the left lower quadrant with additional small cysts. No hydronephrosis.  Bladder is thick-walled although underdistended.  Stomach/Bowel: Stomach is unremarkable.  No evidence of bowel obstruction.  Extensive colonic diverticulosis, without associated inflammatory changes.  Vascular/Lymphatic: Atherosclerotic calcifications of the abdominal aorta and branch vessels.  No suspicious abdominopelvic lymphadenopathy.  Reproductive: Prostate is grossly unremarkable.  Other: No abdominopelvic ascites.  Musculoskeletal: Degenerative changes of the lumbar spine.  IMPRESSION: Right  paraspinal soft tissue mass, measuring at least 3.3 cm, worrisome for primary or metastatic malignancy. Associated osseous destruction/pathologic fracture of the right T2 transverse process.  Two hepatic lesions measuring up to 6.0 cm, worrisome for metastases, less likely abscesses.  Additional ancillary findings as above.  Electronically Signed: By: Julian Hy M.D. On: 12/26/2013 17:44   US Biopsy  12/29/2013   CLINICAL DATA:  53 year old with thoracic paraspinal lesions and liver lesions. Tissue diagnosis is needed.  EXAM: ULTRASOUND-GUIDED LIVER LESION BIOPSY  Physician: Stephan Minister. Anselm Pancoast, MD  FLUOROSCOPY TIME:  None  MEDICATIONS: 0.5 mg Versed, 25 mcg fentanyl. A radiology nurse monitored the patient for moderate sedation.  ANESTHESIA/SEDATION: Moderate sedation time: 15 min  PROCEDURE: The procedure was explained to the patient. The risks and benefits of the procedure were discussed and the patient's questions were addressed. Informed consent was obtained from the patient. The liver was evaluated with ultrasound. The lesion in the anterior left hepatic lobe was selected for biopsy. The anterior abdomen was prepped with Betadine and a sterile field was created. 1% lidocaine was used for local anesthetic. A 17 gauge needle was directed into the lesion with ultrasound guidance. A total of 3 core biopsies were obtained with an 18 gauge core device. Two core biopsies were placed in formalin and a third was placed in saline. 17 gauge needle was removed without complication.  FINDINGS: There is a heterogeneous hypoechoic lesion in the anterior left hepatic lobe. Patient also has a lesion in the posterior right hepatic lobe. Needle position confirmed within the left hepatic lobe lesion.  COMPLICATIONS: None  IMPRESSION: Ultrasound-guided core biopsies of a left hepatic lobe lesion.   Electronically Signed   By: [name] Daft M.D.   On: 12/29/2013 10:43    Oren Binet, MD  [facility] Pager:[phone]  If 7PM-7AM, please contact night-coverage [email or web address] Password [email or web address] 12/30/2013, 1:47 PM   LOS: 4 days

## 2013-12-30 NOTE — Progress Notes (Signed)
Admit: 12/26/2013 LOS: 4  46M ESRD MWF [facility] via LLE AVG and Functioning Pancreatic Transplant w/ hx/o CAD/CABG admitted with LE paralysis and high thoracic paraspinous mass w/ question of liver mets. S/p Liver Bx 12/29/13  Subjective:  Liver Bx yesterday, uneventful,  CBC stable Pt asking about big picture of this, mature in his understanding  10/28 0701 - 10/29 0700 In: 3 [I.V.:3] Out: 2380   Filed Weights   12/29/13 1237 12/29/13 1638 12/30/13 0435  Weight: 63.4 kg (139 lb 12.4 oz) 61 kg (134 lb 7.7 oz) 63 kg (138 lb 14.2 oz)    Scheduled Meds: . amiodarone  200 mg Oral QPM  . antiseptic oral rinse  7 mL Mouth Rinse BID  . azaTHIOprine  75 mg Oral Daily  . calcitRIOL  0.5 mcg Oral Q M,W,F-HD  . ferric gluconate (FERRLECIT/NULECIT) IV  62.5 mg Intravenous Q Wed-HD  . gabapentin  300 mg Oral QHS  . hydrocortisone sod succinate (SOLU-CORTEF) inj  50 mg Intravenous 4 times per day  . lanthanum  1,000 mg Oral TID WC  . latanoprost  1 drop Both Eyes QHS  . levothyroxine  225 mcg Oral QAC breakfast  . midodrine  2.5 mg Oral TID WC  . morphine  15 mg Oral Q12H  . multivitamin  1 tablet Oral Daily  . phenytoin  100 mg Oral 3 times per day  . polycarbophil  625 mg Oral Daily  . sodium chloride  3 mL Intravenous Q12H  . tacrolimus  2 mg Oral BID  . vitamin C  500 mg Oral BID   Continuous Infusions:  PRN Meds:.acetaminophen, dextrose, loperamide, morphine injection, nitroGLYCERIN  Current Labs: reviewed    Physical Exam:  Blood pressure 111/57, pulse 58, temperature 98.5 F (36.9 C), temperature source Oral, resp. rate 19, height 5' 10.5" (1.791 m), weight 63 kg (138 lb 14.2 oz), SpO2 96.00%. GEN: groggy, NAD  ENT: NCAT. Fair dentition  EYES: EOMI  CV: RRR, nl s1s2  PULM: CTAB  ABD: s/nt/nd. +BS  SKIN: no rashes  EXT:No LEE  NEURO: unable to move leg  Outpt HD Orders Unit: [place] Days: MWF  Time: 4.5h  Dialyzer: F160  EDW: 58kg  K/Ca: 2K/2.25Ca  Access:  AVG L Leg  Needle Size: 15g  BFR/DFR: 400/a1.5  UF Proflie: None  VDRA: Calcitriol 0.8mcg qMWF  EPO: None  IV Fe: 50mg  qWk  Heparin: 2000 units IVP qTx  Most Recent Phos / PTH: 3.9 / 321 in 11/2013  Most Recent TSAT: 33% 11/2013  Most Recent eKT/V: 1.85 12/08/13  Treatment Adherence: Excellent  Assessment and Plan 1. Lower Ext Paralysis w/ Thoracic Paraspinous Mass and ? Liver Metastases: s/p liver bx and pending path, no improvement seen with steroids 2. ESRD: On MWF Schedule.   3. Volume Status and Hypotension: tolerates mild hypotension. BP somewhat improved, similar ot outpt values 4. Anemia:Patient is not on ESA therapy nor will be started given the concern for malignancy. He is iron replete. 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 No need to make changes. Lanthanum, Calcitriol 6. Functioning Pancreatic Transplant: cont outpatient immunosuppression of tacrolimus, azathioprine, prednisone   Pearson Grippe MD 12/30/2013, 8:58 AM   Recent Labs Lab 12/27/13 0305 12/28/13 0319 12/29/13 1252  NA 138 140 135*  K 5.0 4.4 5.4*  CL 94* 97 93*  CO2 27 26 25   GLUCOSE 91 140* 132*  BUN 52* 32* 67*  CREATININE 5.91*  3.89* 6.10*  CALCIUM 9.2 8.8 8.6  PHOS  --   --  4.9*    Recent Labs Lab 12/26/13 1348  12/28/13 0319 12/29/13 1252 12/29/13 2354  WBC 12.6*  < > 14.4* 15.4* 15.0*  NEUTROABS 10.5*  --   --   --   --   HGB 10.9*  < > 9.8* 9.9* 10.4*  HCT 33.3*  < > 30.1* 30.7* 31.6*  MCV 99.1  < > 98.7 99.0 97.2  PLT 223  < > 204 229 214  < > = values in this interval not displayed.

## 2013-12-31 DIAGNOSIS — I10 Essential (primary) hypertension: Secondary | ICD-10-CM

## 2013-12-31 LAB — RENAL FUNCTION PANEL
ANION GAP: 19 — AB (ref 5–15)
ANION GAP: 19 — AB (ref 5–15)
Albumin: 1.8 g/dL — ABNORMAL LOW (ref 3.5–5.2)
Albumin: 2 g/dL — ABNORMAL LOW (ref 3.5–5.2)
BUN: 64 mg/dL — AB (ref 6–23)
BUN: 78 mg/dL — ABNORMAL HIGH (ref 6–23)
CHLORIDE: 96 meq/L (ref 96–112)
CHLORIDE: 93 meq/L — AB (ref 96–112)
CO2: 22 meq/L (ref 19–32)
CO2: 23 mEq/L (ref 19–32)
Calcium: 8.7 mg/dL (ref 8.4–10.5)
Calcium: 8.8 mg/dL (ref 8.4–10.5)
Creatinine, Ser: 5.02 mg/dL — ABNORMAL HIGH (ref 0.50–1.35)
Creatinine, Ser: 5.81 mg/dL — ABNORMAL HIGH (ref 0.50–1.35)
GFR calc Af Amer: 14 mL/min — ABNORMAL LOW (ref >90)
GFR calc Af Amer: 11 mL/min — ABNORMAL LOW (ref >90)
GFR calc non Af Amer: 12 mL/min — ABNORMAL LOW (ref >90)
GFR, EST NON AFRICAN AMERICAN: 10 mL/min — AB (ref >90)
Glucose, Bld: 126 mg/dL — ABNORMAL HIGH (ref 70–99)
Glucose, Bld: 128 mg/dL — ABNORMAL HIGH (ref 70–99)
PHOSPHORUS: 4.4 mg/dL (ref 2.3–4.6)
POTASSIUM: 5.4 meq/L — AB (ref 3.7–5.3)
POTASSIUM: 5.9 meq/L — AB (ref 3.7–5.3)
Phosphorus: 3.9 mg/dL (ref 2.3–4.6)
SODIUM: 135 meq/L — AB (ref 137–147)
Sodium: 137 mEq/L (ref 137–147)

## 2013-12-31 LAB — CBC
HCT: 31.1 % — ABNORMAL LOW (ref 39.0–52.0)
Hemoglobin: 10.4 g/dL — ABNORMAL LOW (ref 13.0–17.0)
MCH: 31.9 pg (ref 26.0–34.0)
MCHC: 33.4 g/dL (ref 30.0–36.0)
MCV: 95.4 fL (ref 78.0–100.0)
PLATELETS: 246 10*3/uL (ref 150–400)
RBC: 3.26 MIL/uL — ABNORMAL LOW (ref 4.22–5.81)
RDW: 16 % — AB (ref 11.5–15.5)
WBC: 18.6 10*3/uL — AB (ref 4.0–10.5)

## 2013-12-31 MED ORDER — POLYETHYLENE GLYCOL 3350 17 G PO PACK
17.0000 g | PACK | Freq: Every day | ORAL | Status: DC
  Filled 2013-12-31 (×2): qty 1

## 2013-12-31 MED ORDER — BOOST / RESOURCE BREEZE PO LIQD
1.0000 | Freq: Two times a day (BID) | ORAL | Status: DC
  Administered 2014-01-01 – 2014-01-02 (×3): 1 via ORAL

## 2013-12-31 NOTE — Progress Notes (Signed)
[facility] PROGRESS NOTE Brief Narrative: 53 y.o. 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 He was being evaluated by his orthopedic surgeon who was planning on doing a CT scan. Unfortunately patient developed severe weakness in his left leg (he s/p R AKA). 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 Seen by neurosurgery, not felt to be a surgical candidate, underwent liver biopsy by interventional radiology.    Assessment/Plan:  Paraspinal mass T1-T3  - Complete paralysis of lower extremities with a sensory level at approximately T3 or T4 - lesions at T1-T2 and T3 area with epidural and paraspinous tumor  - high dose steroids - surgical intervention not felt to offer benefit  - pain controlled: MS contin and morphine prn  - IR liver Biopsy on 10.28.2015. - consult PT.  Hypotension  - Multifactorial likely, due to ESRD, possible adrenal insuff, hypovolemia, and pain meds. - Well tolerated at present.  Chronic systolic CHF  - 2-D echo 2/59/5 showed EF of 45% - volume control per HD   ESRD on HD  - Dialysis on Monday/Wednesday/Friday - Nephrology following   Hx of pancreas/kidney transplantation - renal failed - pancreas functional  - continue home immunosuppressive meds   Hypothyroidism  - TSH was 5.1 nl free t4  S/P AICD   DM  CBG well controlled  HLD   Anemia of chronic kidney disease   Code Status: dnr Family Communication: wife  Disposition Plan: inpatient   Consultants:  Neuro  Nephrology  IR  Procedures:  Liver biosy  Antibiotics:  None  HPI/Subjective: Pt disrespectful and belligerent, pain is controlled.  Objective: Filed Vitals:   12/30/13 1943 12/30/13 2200  12/31/13 0115 12/31/13 0542  BP: 105/51 109/51 109/49 105/45  Pulse: 67 63 65 62  Temp: 99 F (37.2 C) 100.4 F (38 C) 97.8 F (36.6 C) 97.7 F (36.5 C)  TempSrc: Oral Oral Oral Oral  Resp: 21 20 18 18   Height:  5\' 10"  (1.778 m)    Weight:  61.6 kg (135 lb 12.9 oz)  61.1 kg (134 lb 11.2 oz)  SpO2: 93% 97% 97% 98%    Intake/Output Summary (Last 24 hours) at 12/31/13 1006 Last data filed at 12/30/13 2206  Gross per 24 hour  Intake    120 ml  Output      2 ml  Net    118 ml   Filed Weights   12/30/13 0435 12/30/13 2200 12/31/13 0542  Weight: 63 kg (138 lb 14.2 oz) 61.6 kg (135 lb 12.9 oz) 61.1 kg (134 lb 11.2 oz)    Exam:  General:  in no acute distress.  HEENT: No bruits, no goiter.  Heart: Regular rate and rhythm Lungs: Good air movement, clear Abdomen: Soft, nontender, nondistended, positive bowel sounds.     Data Reviewed: Basic Metabolic Panel:  Recent Labs Lab 12/26/13 1404 12/27/13 0305 12/28/13 0319 12/29/13 1252 12/31/13 0355  NA 137 138 140 135* 137  K 4.3 5.0 4.4 5.4* 5.4*  CL 96 94* 97 93* 96  CO2  --  27 26 25 22   GLUCOSE 90 91 140* 132* 126*  BUN 41* 52* 32* 67* 64*  CREATININE 4.90* 5.91* 3.89* 6.10* 5.02*  CALCIUM  --  9.2 8.8 8.6 8.7  PHOS  --   --   --  4.9* 3.9   Liver Function Tests:  Recent Labs Lab 12/27/13 0305 12/28/13 0319 12/29/13 1252 12/31/13 0355  AST 203* 123*  --   --   ALT 85* 102*  --   --   ALKPHOS 357* 379*  --   --   BILITOT 0.4 0.3  --   --   PROT 6.7 6.3  --   --   ALBUMIN 2.2* 2.1* 2.1* 1.8*   No results found for this basename: LIPASE, AMYLASE,  in the last 168 hours No results found for this basename: AMMONIA,  in the last 168 hours CBC:  Recent Labs Lab 12/26/13 1348 12/26/13 1404 12/27/13 0305 12/28/13 0319 12/29/13 1252 12/29/13 2354  WBC 12.6*  --  11.6* 14.4* 15.4* 15.0*  NEUTROABS 10.5*  --   --   --   --   --   HGB 10.9* 12.2* 10.0* 9.8* 9.9* 10.4*  HCT 33.3* 36.0* 31.0* 30.1* 30.7*  31.6*  MCV 99.1  --  98.7 98.7 99.0 97.2  PLT 223  --  203 204 229 214   Cardiac Enzymes: No results found for this basename: CKTOTAL, CKMB, CKMBINDEX, TROPONINI,  in the last 168 hours BNP (last 3 results) No results found for this basename: PROBNP,  in the last 8760 hours CBG:  Recent Labs Lab 12/28/13 0752 12/28/13 1242 12/29/13 0818 12/30/13 0754 12/30/13 1647  GLUCAP 135* 128* 139* 127* 119*    Recent Results (from the past 240 hour(s))  MRSA PCR SCREENING     Status: None   Collection Time    12/26/13 11:15 PM      Result Value Ref Range Status   MRSA by PCR NEGATIVE  NEGATIVE Final   Comment:            The GeneXpert MRSA Assay (FDA     approved for NASAL specimens     only), is one component of a     comprehensive MRSA colonization     surveillance program. It is not     intended to diagnose MRSA     infection nor to guide or     monitor treatment for     MRSA infections.     Studies: No results found.  Scheduled Meds: . amiodarone  200 mg Oral QPM  . antiseptic oral rinse  7 mL Mouth Rinse BID  . azaTHIOprine  75 mg Oral Daily  . calcitRIOL  0.5 mcg Oral Q M,W,F-HD  . ferric gluconate (FERRLECIT/NULECIT) IV  62.5 mg Intravenous Q Wed-HD  . gabapentin  300 mg Oral QHS  . hydrocortisone sod succinate (SOLU-CORTEF) inj  50 mg Intravenous 4 times per day  . lanthanum  1,000 mg Oral TID WC  . latanoprost  1 drop Both Eyes QHS  . levothyroxine  225 mcg Oral QAC breakfast  . midodrine  2.5 mg Oral TID WC  . morphine  15 mg Oral Q12H  . multivitamin  1 tablet Oral Daily  . phenytoin  100 mg Oral 3 times per day  . polycarbophil  625 mg Oral Daily  . polyethylene glycol  17 g Oral Daily  . sodium chloride  3 mL Intravenous Q12H  . tacrolimus  2 mg Oral BID  . vitamin C  500 mg Oral BID   Continuous Infusions:    Charlynne Cousins  [facility] Pager [phone]. If 8PM-8AM, please contact night-coverage at [email or web address], password  [email or web address] 12/31/2013, 10:06 AM  LOS: 5 days

## 2013-12-31 NOTE — Progress Notes (Signed)
Admit: 12/26/2013 LOS: 5  67M ESRD MWF [facility] via LLE AVG and Functioning Pancreatic Transplant w/ hx/o CAD/CABG admitted with LE paralysis and high thoracic paraspinous mass w/ question of liver mets. S/p Liver Bx 12/29/13  Subjective:  No new issues Patient for dialysis today.  10/29 0701 - 10/30 0700 In: 123 [P.O.:120; I.V.:3] Out: 2 [Stool:2]  Filed Weights   12/30/13 0435 12/30/13 2200 12/31/13 0542  Weight: 63 kg (138 lb 14.2 oz) 61.6 kg (135 lb 12.9 oz) 61.1 kg (134 lb 11.2 oz)    Scheduled Meds: . amiodarone  200 mg Oral QPM  . antiseptic oral rinse  7 mL Mouth Rinse BID  . azaTHIOprine  75 mg Oral Daily  . calcitRIOL  0.5 mcg Oral Q M,W,F-HD  . feeding supplement (RESOURCE BREEZE)  1 Container Oral BID BM  . ferric gluconate (FERRLECIT/NULECIT) IV  62.5 mg Intravenous Q Wed-HD  . gabapentin  300 mg Oral QHS  . hydrocortisone sod succinate (SOLU-CORTEF) inj  50 mg Intravenous 4 times per day  . lanthanum  1,000 mg Oral TID WC  . latanoprost  1 drop Both Eyes QHS  . levothyroxine  225 mcg Oral QAC breakfast  . midodrine  2.5 mg Oral TID WC  . morphine  15 mg Oral Q12H  . multivitamin  1 tablet Oral Daily  . phenytoin  100 mg Oral 3 times per day  . polycarbophil  625 mg Oral Daily  . polyethylene glycol  17 g Oral Daily  . sodium chloride  3 mL Intravenous Q12H  . tacrolimus  2 mg Oral BID  . vitamin C  500 mg Oral BID   Continuous Infusions:  PRN Meds:.acetaminophen, dextrose, loperamide, morphine injection, nitroGLYCERIN  Current Labs: reviewed    Physical Exam:  Blood pressure 94/50, pulse 60, temperature 97.6 F (36.4 C), temperature source Oral, resp. rate 16, height 5\' 10"  (1.778 m), weight 61.1 kg (134 lb 11.2 oz), SpO2 97.00%. GEN: groggy, NAD  ENT: NCAT. Fair dentition  EYES: EOMI  CV: RRR, nl s1s2  PULM: CTAB  ABD: s/nt/nd. +BS  SKIN: no rashes  EXT:No LEE  NEURO: unable to move leg  Outpt HD Orders Unit: [place] Days: MWF  Time:  4.5h  Dialyzer: F160  EDW: 58kg  K/Ca: 2K/2.25Ca  Access: AVG L Leg  Needle Size: 15g  BFR/DFR: 400/a1.5  UF Proflie: None  VDRA: Calcitriol 0.16mcg qMWF  EPO: None  IV Fe: 50mg  qWk  Heparin: 2000 units IVP qTx  Most Recent Phos / PTH: 3.9 / 321 in 11/2013  Most Recent TSAT: 33% 11/2013  Most Recent eKT/V: 1.85 12/08/13  Treatment Adherence: Excellent  Assessment and Plan 1. Lower Ext Paralysis w/ Thoracic Paraspinous Mass and ? Liver Metastases: s/p liver bx and pending path, no improvement seen with steroids 2. ESRD: On MWF Schedule.   3. Volume Status and Hypotension: tolerates mild hypotension. BP somewhat improved, similar ot outpt values. EDW stable at 58 kg. We have not achieved yet using bed weights. Blood pressure stable. No respiratory issues. 4. Anemia:Patient is not on ESA therapy nor will be started given the concern for malignancy. He is iron replete. 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 No need to make changes. Lanthanum, Calcitriol 6. Functioning Pancreatic Transplant: cont outpatient immunosuppression of tacrolimus, azathioprine, prednisone   Pearson Grippe MD 12/31/2013, 4:22 PM   Recent Labs Lab 12/28/13 0319 12/29/13 1252 12/31/13 0355  NA 140  135* 137  K 4.4 5.4* 5.4*  CL 97 93* 96  CO2 26 25 22   GLUCOSE 140* 132* 126*  BUN 32* 67* 64*  CREATININE 3.89* 6.10* 5.02*  CALCIUM 8.8 8.6 8.7  PHOS  --  4.9* 3.9    Recent Labs Lab 12/26/13 1348  12/28/13 0319 12/29/13 1252 12/29/13 2354  WBC 12.6*  < > 14.4* 15.4* 15.0*  NEUTROABS 10.5*  --   --   --   --   HGB 10.9*  < > 9.8* 9.9* 10.4*  HCT 33.3*  < > 30.1* 30.7* 31.6*  MCV 99.1  < > 98.7 99.0 97.2  PLT 223  < > 204 229 214  < > = values in this interval not displayed.

## 2014-01-01 ENCOUNTER — Inpatient Hospital Stay (HOSPITAL_COMMUNITY): Payer: Medicare Other

## 2014-01-01 DIAGNOSIS — R197 Diarrhea, unspecified: Secondary | ICD-10-CM

## 2014-01-01 LAB — CANCER ANTIGEN 19-9: CA 19 9: 22.4 U/mL — AB (ref <35.0)

## 2014-01-01 LAB — CLOSTRIDIUM DIFFICILE BY PCR: CDIFFPCR: NEGATIVE

## 2014-01-01 LAB — PSA: PSA: 0.35 ng/mL (ref <=4.00)

## 2014-01-01 LAB — CEA: CEA: 6.2 ng/mL — AB (ref 0.0–5.0)

## 2014-01-01 MED ORDER — SODIUM CHLORIDE 0.9 % IV BOLUS (SEPSIS)
250.0000 mL | Freq: Once | INTRAVENOUS | Status: AC
  Administered 2014-01-01: 250 mL via INTRAVENOUS

## 2014-01-01 MED ORDER — SODIUM CHLORIDE 0.9 % IV BOLUS (SEPSIS)
500.0000 mL | Freq: Once | INTRAVENOUS | Status: AC
  Administered 2014-01-01: 500 mL via INTRAVENOUS

## 2014-01-01 MED ORDER — CEFEPIME HCL 1 G IJ SOLR
1.0000 g | INTRAMUSCULAR | Status: DC
  Filled 2014-01-01: qty 1

## 2014-01-01 MED ORDER — SODIUM POLYSTYRENE SULFONATE 15 GM/60ML PO SUSP
30.0000 g | Freq: Once | ORAL | Status: AC
  Administered 2014-01-01: 30 g via ORAL
  Filled 2014-01-01: qty 120

## 2014-01-01 NOTE — Progress Notes (Signed)
[facility] PROGRESS NOTE Brief Narrative: 53 y.o. 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 He was being evaluated by his orthopedic surgeon who was planning on doing a CT scan. Unfortunately patient developed severe weakness in his left leg (he s/p R AKA). 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 Seen by neurosurgery, not felt to be a surgical candidate, underwent liver biopsy by interventional radiology.    Assessment/Plan:  Paraspinal mass T1-T3  - Complete paralysis of lower extremities with a sensory level at approximately T3 or T4 - lesions at T1-T2 and T3 area with epidural and paraspinous tumor  - high dose steroids - surgical intervention not felt to offer benefit  - pain controlled: MS contin and morphine prn  - IR liver Biopsy on 10.28.2015 results pending. CEA high. - consult PT.  Fever and Hypotension  - Multifactorial likely, due to ESRD, possible infectiosu etiology. - Check BC, c. Dif PCR( He is having diarrhea) and CXR. Speech consult. - worsening WBC (he is on steroids)  Chronic systolic CHF  - 2-D echo 7/20/9 showed EF of 45% - volume control per HD   ESRD on HD  - Dialysis on Monday/Wednesday/Friday - Nephrology following   Hx of pancreas/kidney transplantation - renal failed - pancreas functional  - continue home immunosuppressive meds   Hypothyroidism  - TSH was 5.1 nl free t4  S/P AICD   DM  CBG well controlled  HLD   Anemia of chronic kidney disease   Code Status: dnr Family Communication: wife  Disposition Plan: inpatient   Consultants:  Neuro  Nephrology  IR  Procedures:  Liver biosy  Antibiotics:  None  HPI/Subjective: No complains in a better  mood.  Objective: Filed Vitals:   01/01/14 0113 01/01/14 0114 01/01/14 0230 01/01/14 0711  BP: 76/36 76/38 84/48  83/43  Pulse:    65  Temp:    98.3 F (36.8 C)  TempSrc:    Oral  Resp:    18  Height:      Weight:    62.7 kg (138 lb 3.7 oz)  SpO2:    94%    Intake/Output Summary (Last 24 hours) at 01/01/14 0825 Last data filed at 12/31/13 2146  Gross per 24 hour  Intake    480 ml  Output   1669 ml  Net  -1189 ml   Filed Weights   12/30/13 2200 12/31/13 0542 01/01/14 0711  Weight: 61.6 kg (135 lb 12.9 oz) 61.1 kg (134 lb 11.2 oz) 62.7 kg (138 lb 3.7 oz)    Exam:  General:  in no acute distress.  HEENT: No bruits, no goiter.  Heart: Regular rate and rhythm Lungs: Good air movement, clear Abdomen: Soft, nontender, nondistended, positive bowel sounds.     Data Reviewed: Basic Metabolic Panel:  Recent Labs Lab 12/27/13 0305 12/28/13 0319 12/29/13 1252 12/31/13 0355 12/31/13 1750  NA 138 140 135* 137 135*  K 5.0 4.4 5.4* 5.4* 5.9*  CL 94* 97 93* 96 93*  CO2 27 26 25 22 23   GLUCOSE 91 140* 132* 126* 128*  BUN 52* 32* 67* 64* 78*  CREATININE 5.91* 3.89* 6.10* 5.02* 5.81*  CALCIUM 9.2 8.8 8.6 8.7 8.8  PHOS  --   --  4.9* 3.9 4.4   Liver  Function Tests:  Recent Labs Lab 12/27/13 0305 12/28/13 0319 12/29/13 1252 12/31/13 0355 12/31/13 1750  AST 203* 123*  --   --   --   ALT 85* 102*  --   --   --   ALKPHOS 357* 379*  --   --   --   BILITOT 0.4 0.3  --   --   --   PROT 6.7 6.3  --   --   --   ALBUMIN 2.2* 2.1* 2.1* 1.8* 2.0*   No results found for this basename: LIPASE, AMYLASE,  in the last 168 hours No results found for this basename: AMMONIA,  in the last 168 hours CBC:  Recent Labs Lab 12/26/13 1348  12/27/13 0305 12/28/13 0319 12/29/13 1252 12/29/13 2354 12/31/13 1750  WBC 12.6*  --  11.6* 14.4* 15.4* 15.0* 18.6*  NEUTROABS 10.5*  --   --   --   --   --   --   HGB 10.9*  < > 10.0* 9.8* 9.9* 10.4* 10.4*  HCT 33.3*  < > 31.0* 30.1* 30.7*  31.6* 31.1*  MCV 99.1  --  98.7 98.7 99.0 97.2 95.4  PLT 223  --  203 204 229 214 246  < > = values in this interval not displayed. Cardiac Enzymes: No results found for this basename: CKTOTAL, CKMB, CKMBINDEX, TROPONINI,  in the last 168 hours BNP (last 3 results) No results found for this basename: PROBNP,  in the last 8760 hours CBG:  Recent Labs Lab 12/28/13 0752 12/28/13 1242 12/29/13 0818 12/30/13 0754 12/30/13 1647  GLUCAP 135* 128* 139* 127* 119*    Recent Results (from the past 240 hour(s))  MRSA PCR SCREENING     Status: None   Collection Time    12/26/13 11:15 PM      Result Value Ref Range Status   MRSA by PCR NEGATIVE  NEGATIVE Final   Comment:            The GeneXpert MRSA Assay (FDA     approved for NASAL specimens     only), is one component of a     comprehensive MRSA colonization     surveillance program. It is not     intended to diagnose MRSA     infection nor to guide or     monitor treatment for     MRSA infections.     Studies: No results found.  Scheduled Meds: . amiodarone  200 mg Oral QPM  . antiseptic oral rinse  7 mL Mouth Rinse BID  . azaTHIOprine  75 mg Oral Daily  . calcitRIOL  0.5 mcg Oral Q M,W,F-HD  . feeding supplement (RESOURCE BREEZE)  1 Container Oral BID BM  . ferric gluconate (FERRLECIT/NULECIT) IV  62.5 mg Intravenous Q Wed-HD  . gabapentin  300 mg Oral QHS  . hydrocortisone sod succinate (SOLU-CORTEF) inj  50 mg Intravenous 4 times per day  . lanthanum  1,000 mg Oral TID WC  . latanoprost  1 drop Both Eyes QHS  . levothyroxine  225 mcg Oral QAC breakfast  . midodrine  2.5 mg Oral TID WC  . morphine  15 mg Oral Q12H  . multivitamin  1 tablet Oral Daily  . phenytoin  100 mg Oral 3 times per day  . polycarbophil  625 mg Oral Daily  . polyethylene glycol  17 g Oral Daily  . sodium chloride  3 mL Intravenous Q12H  . tacrolimus  2 mg Oral  BID  . vitamin C  500 mg Oral BID   Continuous Infusions:    Charlynne Cousins  [facility] Pager [phone]. If 8PM-8AM, please contact night-coverage at [email or web address], password [facility] 01/01/2014, 8:25 AM  LOS: 6 days

## 2014-01-01 NOTE — Progress Notes (Signed)
Patient has been having low blood pressures today. When back from hemodialysis his blood pressure was 88/48. Nonsymptomatic. MD made aware and advised to recheck later. Blood pressure rechecked at 0100 and it was 76/36, pt asymptomatic. MD made aware and 250 cc bolus is being run. Will continue to monitor patient.

## 2014-01-01 NOTE — Evaluation (Signed)
Physical Therapy Evaluation Patient Details Name: [name] MRN: [id number] DOB: 07-04-1960 Today's Date: 01/01/2014   History of Present Illness    81M ESRD MWF [place] [facility] via LLE AVG and Functioning Pancreatic Transplant w/ hx/o CAD/CABG admitted with LE paralysis and high thoracic paraspinous mass w/ question of liver mets. S/p Liver Bx 12/29/13   Clinical Impression  Patient demonstrates deficits in functional mobility as indicated below. Will benefit from continued skilled PT to address deficits and maximize function. Will see as indicated and progress as tolerated.    Follow Up Recommendations CIR;Supervision/Assistance - 24 hour    Equipment Recommendations  Hospital bed    Recommendations for Other Services Rehab consult     Precautions / Restrictions        Mobility  Bed Mobility Overal bed mobility: Needs Assistance;+2 for physical assistance Bed Mobility: Rolling;Sidelying to Sit;Sit to Sidelying Rolling: Max assist;+2 for physical assistance Sidelying to sit: Max assist;+2 for physical assistance     Sit to sidelying: Max assist;+2 for physical assistance General bed mobility comments: Patient attempting to assist with UEs  Transfers                    Ambulation/Gait                Stairs            Wheelchair Mobility    Modified Rankin (Stroke Patients Only)       Balance                                             Pertinent Vitals/Pain Pain Assessment: 0-10 Pain Score: 6  Pain Location: back and between shoulder blades Pain Descriptors / Indicators: Constant;Sharp Pain Intervention(s): Limited activity within patient's tolerance;Monitored during session;Repositioned    Home Living Family/patient expects to be discharged to:: Private residence Living Arrangements: Spouse/significant other Available Help at Discharge: Family Type of Home: House Home Access: Ramped entrance     Home Layout:  One level Home Equipment: Wheelchair - manual      Prior Function Level of Independence: Needs assistance         Comments: could transfer into chair without assist and mobilie using chair      Hand Dominance   Dominant Hand: Right    Extremity/Trunk Assessment   Upper Extremity Assessment: Generalized weakness           Lower Extremity Assessment: RLE deficits/detail;LLE deficits/detail (No active ROM, no active sensation below thigh BLEs) RLE Deficits / Details: AKA LLE Deficits / Details: no active movement     Communication   Communication:  (blind)  Cognition Arousal/Alertness: Awake/alert Behavior During Therapy: WFL for tasks assessed/performed Overall Cognitive Status: Within Functional Limits for tasks assessed                      General Comments General comments (skin integrity, edema, etc.): Sat EOB for 3 minutes BP assessed 80s/40s patient returned to supine, patient with BM during session assist with hygiene and pericare    Exercises        Assessment/Plan    PT Assessment Patient needs continued PT services  PT Diagnosis Generalized weakness;Acute pain   PT Problem List Decreased strength;Decreased range of motion;Decreased activity tolerance;Decreased balance;Decreased mobility;Decreased coordination;Decreased cognition  PT Treatment Interventions DME instruction;Functional mobility training;Therapeutic activities;Therapeutic exercise;Balance training;Neuromuscular  re-education;Cognitive remediation;Patient/family education   PT Goals (Current goals can be found in the Care Plan section) Acute Rehab PT Goals Patient Stated Goal: to be able to move leg PT Goal Formulation: With patient Time For Goal Achievement: 01/15/14 Potential to Achieve Goals: Fair    Frequency Min 3X/week   Barriers to discharge        Co-evaluation               End of Session Equipment Utilized During Treatment: Oxygen Activity Tolerance:  Patient limited by fatigue;Patient limited by pain Patient left: in bed;with call bell/phone within reach;with bed alarm set;with family/visitor present Nurse Communication: Mobility status         Time: 8453-6468 PT Time Calculation (min): 25 min   Charges:   PT Evaluation $Initial PT Evaluation Tier I: 1 Procedure PT Treatments $Therapeutic Activity: 8-22 mins $Self Care/Home Management: 8-22   PT G CodesDuncan Price 01/01/2014, 3:44 PM [name], Interior DPT  [phone]

## 2014-01-01 NOTE — Evaluation (Signed)
Clinical/Bedside Swallow Evaluation Patient Details  Name: [name] MRN: [id number] Date of Birth: 1960/12/26  Today's Date: 01/01/2014 Time: 1100-1119 SLP Time Calculation (min): 19 min  Past Medical History:  Past Medical History  Diagnosis Date  . Secondary hyperparathyroidism, renal     In setting of ESRD.  [name] Hypothyroidism   . Automatic implantable cardiac defibrillator in situ     CRT-ICD   . Hypertension   . Nonischemic cardiomyopathy     2D-echocardiogram (08/2007) - LV EF 45%, akinesis of inferoseptal wall, inferior wall,   . ESRD on hemodialysis Started age 52yo    on MWF schedule. Previous failed renal transplant 1998 at [facility] (with rejection in 1998). 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 Resumed HD 10/2006.   [name] DM type 1 (diabetes mellitus, type 1) DX: age 77yo    previously well controlled with Aic 5 (12/2008)  . Intracranial hemorrhage     History of. On prophylactic dilantin.  . 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  . Hyperlipidemia   . Hemopericardium     History of in setting of failed radiogrequency ablation for Vtach  . Anemia of chronic disease     BL Hgb 11-13. In setting of ESRD.   [name] Peptic ulcer disease     Unknown history, no records per [facility].   . Cholelithiasis     Noted at least since 01/2006. Asymptomatic.  . Erectile dysfunction   . Peripheral autonomic neuropathy due to diabetes mellitus   . GERD (gastroesophageal reflux disease)   . Glaucoma   . Squamous cell skin cancer, finger     History of. Marginal resection in 05/2008 with recurrent infection/ lytic lesion involving distal phalanx  . Toe amputation status     Ischemic fourth and fifith toe left (03/2009), history of previous 1,2,3 toe  amputations.   [name] MRSA (methicillin resistant staph aureus) culture positive     Verify type - Per medical  history form dated 02/13/10.  [name] Poor circulation     per medical history form dated 02/13/10.  . Ventricular tachycardia     appropriate ATP and Shocks 12/12  . RIATA LEAD     EXTERNALIZATION 12/12 (sk) // high RV threshold  . S/P BKA (below knee amputation)     right  . Arthritis   . Blood transfusion without reported diagnosis   . Heart murmur   . Coronary artery disease   . ICD (implantable cardiac defibrillator) in place   . Legally blind   . Arteriovenous graft for hemodialysis in place, primary 2009    left leg, had a previous graft in right arm that occulded  . Chronic cholecystitis with calculus s/p lap chole Feb 2014 03/11/2012   Past Surgical History:  Past Surgical History  Procedure Laterality Date  . Cardiac defibrillator placement  11/05    St Jude  . Transplant pancreatic allograft  2002  . Toe amputation  03/2009    left 4th, 5th toes. Previous 1,2, 3 toe left.  . Kidney transplant  2002  . Above knee leg amputation  11/09/10    Right AKA  . Ablation      2006  . Cholecystectomy N/A 04/16/2012    Procedure: LAPAROSCOPIC CHOLECYSTECTOMY WITH INTRAOPERATIVE CHOLANGIOGRAM;  Surgeon: Adin Hector, MD;  Location: [facility];  Service: General;  Laterality: N/A;  . Coronary artery bypass graft    .  Vascular surgery    . Eye surgery    . Insert / replace / remove pacemaker     HPI:  53 y.o. male with PMH of patient reported CVA with subsequent blindness, end-stage renal disease on dialysis (M/W/F), history of pancreas and kidney transplantation (on immunosuppressants), CHF, ICD, hypertension, GERD, hypothyroidism, and hyperlipidemia, who presented with back pain that started 6 weeks prior. He was being evaluated by his orthopedic surgeon who was planning on doing a CT scan. Unfortunately patient developed severe weakness in his left leg (he s/p R AKA). 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 IR liver biopsy 10/28  results pending. Patient with fever, likely multifactorial due to ESRD, possible enfectious etiology. CXR pending.     Assessment / Plan / Recommendation Clinical Impression  Patient presents with a functional oropharyngeal swallow without overt indication of aspiration. H/o GERD could attribute to aspiration however does not appear to be an issue currently. Reviewed general safe swallowing and esophageal precautions as well as encouraged patient to take MD prescribed GERD medications (patient not currently taking) if still indicated by MD. CXR has not yet been completed per chart review. If aspiration noted on CXR, MBS may be beneficial. Otherwise, no f/u.     Aspiration Risk  Mild    Diet Recommendation Regular;Thin liquid   Liquid Administration via: Cup;Straw Medication Administration: Whole meds with liquid Supervision: Staff to assist with self feeding (given visual impairements) Compensations: Slow rate;Small sips/bites;Follow solids with liquid Postural Changes and/or Swallow Maneuvers: Seated upright 90 degrees;Upright 30-60 min after meal    Other  Recommendations Oral Care Recommendations: Oral care BID   Follow Up Recommendations  None       Pertinent Vitals/Pain n/a        Swallow Study    General HPI: 53 y.o. male with PMH of patient reported CVA with subsequent blindness, end-stage renal disease on dialysis (M/W/F), history of pancreas and kidney transplantation (on immunosuppressants), CHF, ICD, hypertension, GERD, hypothyroidism, and hyperlipidemia, who presented with back pain that started 6 weeks prior. He was being evaluated by his orthopedic surgeon who was planning on doing a CT scan. Unfortunately patient developed severe weakness in his left leg (he s/p R AKA). 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 IR liver biopsy 10/28 results pending. Patient with fever, likely multifactorial due to ESRD, possible  enfectious etiology. CXR pending.   Type of Study: Bedside swallow evaluation Previous Swallow Assessment: none noted in chart Diet Prior to this Study: Regular;Thin liquids Temperature Spikes Noted: Yes Respiratory Status: Room air History of Recent Intubation: No Behavior/Cognition: Alert;Cooperative;Pleasant mood Oral Cavity - Dentition: Adequate natural dentition Self-Feeding Abilities: Able to feed self;Needs assist Patient Positioning: Upright in bed Baseline Vocal Quality: Clear Volitional Cough: Strong Volitional Swallow: Able to elicit    Oral/Motor/Sensory Function Overall Oral Motor/Sensory Function: Appears within functional limits for tasks assessed   Ice Chips Ice chips: Not tested   Thin Liquid Thin Liquid: Within functional limits Presentation: Cup;Self Fed;Straw    Nectar Thick Nectar Thick Liquid: Not tested   Honey Thick Honey Thick Liquid: Not tested   Puree Puree: Within functional limits Presentation: Spoon   Solid   GO   Suhaila Troiano MA, CCC-SLP [phone]  Solid: Within functional limits       Kawon Willcutt Meryl 01/01/2014,11:25 AM

## 2014-01-01 NOTE — Progress Notes (Signed)
Admit: 12/26/2013 LOS: 6  34M ESRD MWF [facility] via LLE AVG and Functioning Pancreatic Transplant w/ hx/o CAD/CABG admitted with LE paralysis and high thoracic paraspinous mass w/ question of liver mets. S/p Liver Bx 12/29/13  Subjective:  No new issues No pathology results yet CEA mildly elevated Afebrile, on corticosteroids with leukocytosis Has chronic hypotension Hemodialysis yesterday, 1.7 L UF, uneventful treatment  10/30 0701 - 10/31 0700 In: 480 [P.O.:480] Out: 1669 [Stool:1]  Filed Weights   12/30/13 2200 12/31/13 0542 01/01/14 0711  Weight: 61.6 kg (135 lb 12.9 oz) 61.1 kg (134 lb 11.2 oz) 62.7 kg (138 lb 3.7 oz)    Scheduled Meds: . amiodarone  200 mg Oral QPM  . antiseptic oral rinse  7 mL Mouth Rinse BID  . azaTHIOprine  75 mg Oral Daily  . calcitRIOL  0.5 mcg Oral Q M,W,F-HD  . feeding supplement (RESOURCE BREEZE)  1 Container Oral BID BM  . ferric gluconate (FERRLECIT/NULECIT) IV  62.5 mg Intravenous Q Wed-HD  . gabapentin  300 mg Oral QHS  . hydrocortisone sod succinate (SOLU-CORTEF) inj  50 mg Intravenous 4 times per day  . lanthanum  1,000 mg Oral TID WC  . latanoprost  1 drop Both Eyes QHS  . levothyroxine  225 mcg Oral QAC breakfast  . midodrine  2.5 mg Oral TID WC  . morphine  15 mg Oral Q12H  . multivitamin  1 tablet Oral Daily  . phenytoin  100 mg Oral 3 times per day  . polycarbophil  625 mg Oral Daily  . sodium chloride  3 mL Intravenous Q12H  . tacrolimus  2 mg Oral BID  . vitamin C  500 mg Oral BID   Continuous Infusions:  PRN Meds:.acetaminophen, dextrose, morphine injection, nitroGLYCERIN  Current Labs: reviewed    Physical Exam:  Blood pressure 83/43, pulse 65, temperature 98.3 F (36.8 C), temperature source Oral, resp. rate 18, height 5\' 10"  (1.778 m), weight 62.7 kg (138 lb 3.7 oz), SpO2 94.00%. GEN: groggy, NAD  ENT: NCAT. Fair dentition  EYES: EOMI  CV: RRR, nl s1s2  PULM: CTAB  ABD: s/nt/nd. +BS  SKIN: no rashes  EXT:No  LEE  NEURO: unable to move leg  Outpt HD Orders Unit: [facility] Days: MWF  Time: 4.5h  Dialyzer: F160  EDW: 58kg  K/Ca: 2K/2.25Ca  Access: AVG L Leg  Needle Size: 15g  BFR/DFR: 400/a1.5  UF Proflie: None  VDRA: Calcitriol 0.36mcg qMWF  EPO: None  IV Fe: 50mg  qWk  Heparin: 2000 units IVP qTx  Most Recent Phos / PTH: 3.9 / 321 in 11/2013  Most Recent TSAT: 33% 11/2013  Most Recent eKT/V: 1.85 12/08/13  Treatment Adherence: Excellent  Assessment and Plan 1. Lower Ext Paralysis w/ Thoracic Paraspinous Mass and ? Liver Metastases:  1. s/p liver bx and pending path 2. no improvement seen with steroids 3. Not sure if he would be a candidate for chemotherapy, perhaps palliative radiation therapy if malignancy is confirmed 4. He is aware of the gravity of this issue 5. CEA elevated 2. ESRD: On MWF Schedule.   3. Volume Status and Hypotension: tolerates mild hypotension. BP somewhat improved, similar ot outpt values. EDW stable at 58 kg. We have not achieved yet using bed weights. Blood pressure stable. No respiratory issues. 4. Anemia: 1. Patient is not on ESA therapy nor will be started given the concern for malignancy.  2. He is iron replete. Continue maintenance weekly iron 5. CKD-MBD: Indices are all at  goal on the outpatient scheduled regimen. No need to make changes. Lanthanum, Calcitriol 6. Functioning Pancreatic Transplant: cont outpatient immunosuppression of tacrolimus, azathioprine, prednisone 7. Leukocytosis:  1. Suspect related to corticosteroids  2. Infectious workup started today, afebrile, not on antibiotics  3. Having diarrhea and C. difficile testing ordered    Pearson Grippe MD 01/01/2014, 11:49 AM   Recent Labs Lab 12/29/13 1252 12/31/13 0355 12/31/13 1750  NA 135* 137 135*  K 5.4* 5.4* 5.9*  CL 93* 96 93*  CO2 25 22 23   GLUCOSE 132* 126* 128*  BUN 67* 64* 78*  CREATININE 6.10* 5.02* 5.81*  CALCIUM 8.6 8.7 8.8  PHOS 4.9* 3.9 4.4    Recent Labs Lab  12/26/13 1348  12/29/13 1252 12/29/13 2354 12/31/13 1750  WBC 12.6*  < > 15.4* 15.0* 18.6*  NEUTROABS 10.5*  --   --   --   --   HGB 10.9*  < > 9.9* 10.4* 10.4*  HCT 33.3*  < > 30.7* 31.6* 31.1*  MCV 99.1  < > 99.0 97.2 95.4  PLT 223  < > 229 214 246  < > = values in this interval not displayed.

## 2014-01-01 NOTE — Progress Notes (Signed)
Chaplain responded to page that pt's wife Juliann Pulse in need of support. Chaplain provided empathic listening while Juliann Pulse shared her deep sadness at the prospect of losing her husband. She said that she doesn't want him to suffer anymore, and "I'm not ready to lose him yet." Patient said he has given it a lot of thought and he thinks "it's time." Chaplain offered prayer about God's love, praying for physical comfort for pt and emotional comfort for [place]. Pt and Juliann Pulse both had emotional responses to prayer and expressed gratitude for prayer and chaplain visit. I think follow-up may be helpful and will refer case to spiritual care team.    01/01/14 2120  Clinical Encounter Type  Visited With Patient and family together  Visit Type Spiritual support  Referral From Nurse  Consult/Referral To Chaplain  Spiritual Encounters  Spiritual Needs Grief support;Emotional;Prayer  Stress Factors  Patient Stress Factors Health changes;Exhausted  Family Stress Factors Loss;Major life changes    Vanetta Mulders 01/01/2014 9:49 PM

## 2014-01-01 NOTE — Progress Notes (Addendum)
Patient had stage 1 pressure ulcer/redness to sacrum prior to unit admission. During assessment today, after old foam dressing was taken off, it was noticed that sacrum was open, red, had small sanguinous drainage, top layer of skin was peeled off. Wound measures 3inches wide by 2 inches long. Wound bed is red, raw, with purple unattached edges. MD notified, wound RN consult order entered. Wound cleansed with saline, new foam dressing applied, patient turned on side, pressure off of sacrum, also flexiseal applied.

## 2014-01-01 NOTE — Progress Notes (Signed)
Chaplain brought prayer shawl.  Vanetta Mulders 01/01/2014 10:04 PM

## 2014-01-02 LAB — BASIC METABOLIC PANEL
Anion gap: 17 — ABNORMAL HIGH (ref 5–15)
BUN: 62 mg/dL — ABNORMAL HIGH (ref 6–23)
CALCIUM: 8.4 mg/dL (ref 8.4–10.5)
CO2: 24 mEq/L (ref 19–32)
Chloride: 94 mEq/L — ABNORMAL LOW (ref 96–112)
Creatinine, Ser: 4.87 mg/dL — ABNORMAL HIGH (ref 0.50–1.35)
GFR calc Af Amer: 14 mL/min — ABNORMAL LOW (ref >90)
GFR calc non Af Amer: 12 mL/min — ABNORMAL LOW (ref >90)
GLUCOSE: 118 mg/dL — AB (ref 70–99)
POTASSIUM: 3.6 meq/L — AB (ref 3.7–5.3)
Sodium: 135 mEq/L — ABNORMAL LOW (ref 137–147)

## 2014-01-02 NOTE — Progress Notes (Signed)
Patient is sleeping peacefully. Denies c/o pain/discomfort at present time. Will continue to monitor.  Esperanza Heir, RN

## 2014-01-02 NOTE — Clinical Social Work Note (Signed)
Clinical Social Work Department BRIEF PSYCHOSOCIAL ASSESSMENT 01/02/2014  Patient:  [name], [name]     Account Number:  [id number]     Admit date:  12/26/2013  Clinical Social Worker:  Hubert Azure  Date/Time:  01/02/2014 08:05 PM  Referred by:  Physician  Date Referred:  01/02/2014 Referred for  SNF Placement   Other Referral:   Interview type:  Patient Other interview type:    PSYCHOSOCIAL DATA Living Status:  WIFE Admitted from facility:   Level of care:   Primary support name:  [name] ([phone]) Primary support relationship to patient:  SPOUSE Degree of support available:   Good    CURRENT CONCERNS Current Concerns  Post-Acute Placement   Other Concerns:    SOCIAL WORK ASSESSMENT / PLAN CSW met with patient who was alert and oriented. CSW observed several family members present in room. CSW introduced self and explained role. CSW explained to patient d/c information would be discussed and would be confidential. Patient reports his wife and family are present in room and he is agreeable with family hearing information. CSW explained SNF process and discussed d/c plan with patient. Per patient, he was made aware by MD that he "will probably never walk again," so he prefers to speak with Palliative care as he is "ready" and "buying his time." Patient reports he is not ready to discuss comfort care, but desires to speak with Palliative Care. Patient states he is declining SNF because he does not see any benefits to rehabilitation at this time.   Assessment/plan status:  No Further Intervention Required Other assessment/ plan:   Patient has declined SNF placement. CSW is signing off.   Information/referral to community resources:    PATIENT'S/FAMILY'S RESPONSE TO PLAN OF CARE: Patient was cooperative, but declined SNF placement at this time.   [place], [place] Weekend Clinical Social Worker [phone]

## 2014-01-02 NOTE — Progress Notes (Signed)
Admit: 12/26/2013 LOS: 7  44M ESRD MWF [facility] via LLE AVG and Functioning Pancreatic Transplant w/ hx/o CAD/CABG admitted with LE paralysis and high thoracic paraspinous mass w/ question of liver mets. S/p Liver Bx 12/29/13  Subjective:  No new issues No pathology results yet Pt and wife at bediside this  AM.  [name] expresses clearly that he sees potential future of aggressive care on HD, possibly with treatment for malignancy, and poor quality of life.  He doesn't want this approach.  His wife supports him.    10/31 0701 - 11/01 0700 In: 240 [P.O.:240] Out: -   Filed Weights   12/31/13 0542 01/01/14 0711 01/02/14 0600  Weight: 61.1 kg (134 lb 11.2 oz) 62.7 kg (138 lb 3.7 oz) 62 kg (136 lb 11 oz)    Scheduled Meds: . amiodarone  200 mg Oral QPM  . antiseptic oral rinse  7 mL Mouth Rinse BID  . azaTHIOprine  75 mg Oral Daily  . calcitRIOL  0.5 mcg Oral Q M,W,F-HD  . feeding supplement (RESOURCE BREEZE)  1 Container Oral BID BM  . ferric gluconate (FERRLECIT/NULECIT) IV  62.5 mg Intravenous Q Wed-HD  . gabapentin  300 mg Oral QHS  . hydrocortisone sod succinate (SOLU-CORTEF) inj  50 mg Intravenous 4 times per day  . lanthanum  1,000 mg Oral TID WC  . latanoprost  1 drop Both Eyes QHS  . levothyroxine  225 mcg Oral QAC breakfast  . midodrine  2.5 mg Oral TID WC  . morphine  15 mg Oral Q12H  . multivitamin  1 tablet Oral Daily  . phenytoin  100 mg Oral 3 times per day  . polycarbophil  625 mg Oral Daily  . sodium chloride  3 mL Intravenous Q12H  . tacrolimus  2 mg Oral BID  . vitamin C  500 mg Oral BID   Continuous Infusions:  PRN Meds:.acetaminophen, dextrose, morphine injection, nitroGLYCERIN  Current Labs: reviewed    Physical Exam:  Blood pressure 100/52, pulse 62, temperature 98.1 F (36.7 C), temperature source Oral, resp. rate 18, height 5\' 10"  (1.778 m), weight 62 kg (136 lb 11 oz), SpO2 99 %. GEN: groggy, NAD  ENT: NCAT. Fair dentition  EYES: EOMI  CV: RRR,  nl s1s2  PULM: CTAB  ABD: s/nt/nd. +BS  SKIN: no rashes  EXT:No LEE  NEURO: unable to move leg  Outpt HD Orders Unit: [facility] Days: MWF  Time: 4.5h  Dialyzer: F160  EDW: 58kg  K/Ca: 2K/2.25Ca  Access: AVG L Leg  Needle Size: 15g  BFR/DFR: 400/a1.5  UF Proflie: None  VDRA: Calcitriol 0.78mcg qMWF  EPO: None  IV Fe: 50mg  qWk  Heparin: 2000 units IVP qTx  Most Recent Phos / PTH: 3.9 / 321 in 11/2013  Most Recent TSAT: 33% 11/2013  Most Recent eKT/V: 1.85 12/08/13  Treatment Adherence: Excellent  Assessment and Plan 1. Lower Ext Paralysis w/ Thoracic Paraspinous Mass and ? Liver Metastases:  1. s/p liver bx and pending path 2. no improvement seen with steroids 3. Not sure if he would be a candidate for chemotherapy, perhaps palliative radiation therapy if malignancy is confirmed 4. He is aware of the gravity of this issue 5. Pt moving towards palliative approach, will consult palliative care 2. ESRD: On MWF Schedule.   1. Cont on treatment while he comes to decision of goal of care 3. Volume Status and Hypotension: tolerates mild hypotension. BP somewhat improved, similar ot outpt values. EDW stable at 58 kg.  We have not achieved yet using bed weights. Blood pressure stable. No respiratory issues. 4. Anemia: 1. Patient is not on ESA therapy nor will be started given the concern for malignancy.  2. He is iron replete. 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 No need to make changes. Lanthanum, Calcitriol 6. Functioning Pancreatic Transplant: cont outpatient immunosuppression of tacrolimus, azathioprine, prednisone 7. Leukocytosis:  1. Suspect related to corticosteroids  2. Infectious workup started today, afebrile, not on antibiotics  3. C Dificile negative 10/31   Pearson Grippe MD 01/02/2014, 8:32 AM   Recent Labs Lab 12/29/13 1252 12/31/13 0355 12/31/13 1750 01/02/14 0351  NA 135* 137 135* 135*  K  5.4* 5.4* 5.9* 3.6*  CL 93* 96 93* 94*  CO2 25 22 23 24   GLUCOSE 132* 126* 128* 118*  BUN 67* 64* 78* 62*  CREATININE 6.10* 5.02* 5.81* 4.87*  CALCIUM 8.6 8.7 8.8 8.4  PHOS 4.9* 3.9 4.4  --     Recent Labs Lab 12/26/13 1348  12/29/13 1252 12/29/13 2354 12/31/13 1750  WBC 12.6*  < > 15.4* 15.0* 18.6*  NEUTROABS 10.5*  --   --   --   --   HGB 10.9*  < > 9.9* 10.4* 10.4*  HCT 33.3*  < > 30.7* 31.6* 31.1*  MCV 99.1  < > 99.0 97.2 95.4  PLT 223  < > 229 214 246  < > = values in this interval not displayed.

## 2014-01-02 NOTE — Progress Notes (Signed)
[facility] PROGRESS NOTE Brief Narrative: 53 y.o. 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 He was being evaluated by his orthopedic surgeon who was planning on doing a CT scan. Unfortunately patient developed severe weakness in his left leg (he s/p R AKA). 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 Seen by neurosurgery, not felt to be a surgical candidate, underwent liver biopsy by interventional radiology.    Assessment/Plan:  Paraspinal mass T1-T3  - Complete paralysis of lower extremities with a sensory level at approximately T3 or T4 - lesions at T1-T2 and T3 area with epidural and paraspinous tumor  - high dose steroids - surgical intervention not felt to offer benefit  - pain controlled: MS contin and morphine prn  - IR liver Biopsy on 10.28.2015 results pending. CEA high. - He would like to meet with PMT as he is realizing thing are going to get worse.  Fever and Hypotension  - Multifactorial likely, due to ESRD, possible infectiosu etiology. - Check BC, c. Dif PCR( He is having diarrhea) and CXR. Speech consult. - worsening WBC (he is on steroids)  Chronic systolic CHF  - 2-D echo 7/90/2 showed EF of 45% - volume control per HD   ESRD on HD  - Dialysis on Monday/Wednesday/Friday - Nephrology following   Hx of pancreas/kidney transplantation - renal failed - pancreas functional  - continue home immunosuppressive meds   Hypothyroidism  - TSH was 5.1 nl free t4  S/P AICD   DM  CBG well controlled  HLD   Anemia of chronic kidney disease   Code Status: dnr Family Communication: wife  Disposition Plan: inpatient   Consultants:  Neuro  Nephrology  IR  Procedures:  Liver  biosy  Antibiotics:  None  HPI/Subjective: No complains.  Objective: Filed Vitals:   01/01/14 2151 01/02/14 0200 01/02/14 0600 01/02/14 0810  BP: 105/49 92/44 90/47  100/52  Pulse: 69 65 62 62  Temp: 98.9 F (37.2 C) 98.4 F (36.9 C) 98.1 F (36.7 C)   TempSrc: Oral Oral Oral   Resp: 18 18 18    Height:      Weight:   62 kg (136 lb 11 oz)   SpO2: 95% 97% 99%     Intake/Output Summary (Last 24 hours) at 01/02/14 1021 Last data filed at 01/02/14 0935  Gross per 24 hour  Intake    480 ml  Output      0 ml  Net    480 ml   Filed Weights   12/31/13 0542 01/01/14 0711 01/02/14 0600  Weight: 61.1 kg (134 lb 11.2 oz) 62.7 kg (138 lb 3.7 oz) 62 kg (136 lb 11 oz)    Exam:  General:  in no acute distress.  HEENT: No bruits, no goiter.  Heart: Regular rate and rhythm Lungs: Good air movement, clear Abdomen: Soft, nontender, nondistended, positive bowel sounds.     Data Reviewed: Basic Metabolic Panel:  Recent Labs Lab 12/28/13 0319 12/29/13 1252 12/31/13 0355 12/31/13 1750 01/02/14 0351  NA 140 135* 137 135* 135*  K 4.4 5.4* 5.4* 5.9* 3.6*  CL 97 93* 96 93* 94*  CO2 26 25 22 23 24   GLUCOSE 140* 132* 126* 128* 118*  BUN 32* 67* 64* 78* 62*  CREATININE 3.89* 6.10* 5.02* 5.81*  4.87*  CALCIUM 8.8 8.6 8.7 8.8 8.4  PHOS  --  4.9* 3.9 4.4  --    Liver Function Tests:  Recent Labs Lab 12/27/13 0305 12/28/13 0319 12/29/13 1252 12/31/13 0355 12/31/13 1750  AST 203* 123*  --   --   --   ALT 85* 102*  --   --   --   ALKPHOS 357* 379*  --   --   --   BILITOT 0.4 0.3  --   --   --   PROT 6.7 6.3  --   --   --   ALBUMIN 2.2* 2.1* 2.1* 1.8* 2.0*   No results for input(s): LIPASE, AMYLASE in the last 168 hours. No results for input(s): AMMONIA in the last 168 hours. CBC:  Recent Labs Lab 12/26/13 1348  12/27/13 0305 12/28/13 0319 12/29/13 1252 12/29/13 2354 12/31/13 1750  WBC 12.6*  --  11.6* 14.4* 15.4* 15.0* 18.6*  NEUTROABS 10.5*  --   --   --   --    --   --   HGB 10.9*  < > 10.0* 9.8* 9.9* 10.4* 10.4*  HCT 33.3*  < > 31.0* 30.1* 30.7* 31.6* 31.1*  MCV 99.1  --  98.7 98.7 99.0 97.2 95.4  PLT 223  --  203 204 229 214 246  < > = values in this interval not displayed. Cardiac Enzymes: No results for input(s): CKTOTAL, CKMB, CKMBINDEX, TROPONINI in the last 168 hours. BNP (last 3 results) No results for input(s): PROBNP in the last 8760 hours. CBG:  Recent Labs Lab 12/28/13 0752 12/28/13 1242 12/29/13 0818 12/30/13 0754 12/30/13 1647  GLUCAP 135* 128* 139* 127* 119*    Recent Results (from the past 240 hour(s))  MRSA PCR Screening     Status: None   Collection Time: 12/26/13 11:15 PM  Result Value Ref Range Status   MRSA by PCR NEGATIVE NEGATIVE Final    Comment:        The GeneXpert MRSA Assay (FDA approved for NASAL specimens only), is one component of a comprehensive MRSA colonization surveillance program. It is not intended to diagnose MRSA infection nor to guide or monitor treatment for MRSA infections.  Clostridium Difficile by PCR     Status: None   Collection Time: 01/01/14 12:51 PM  Result Value Ref Range Status   C difficile by pcr NEGATIVE NEGATIVE Final     Studies: Dg Chest Port 1 View  01/01/2014   CLINICAL DATA:  Fever for 2 days which short of breath.  EXAM: PORTABLE CHEST - 1 VIEW  COMPARISON:  Radiograph 12/22/2013  FINDINGS: Right-sided defibrillator noted. Sternotomy wires overlie stable mildly enlarged cardiac silhouette. Patient is rotated leftward which does exaggerates the right hilum. No overt pulmonary edema. No pleural fluid. No pneumothorax.  IMPRESSION: No significant change.  No acute cardiopulmonary findings.   Electronically Signed   By: Suzy Bouchard M.D.   On: 01/01/2014 12:23    Scheduled Meds: . amiodarone  200 mg Oral QPM  . antiseptic oral rinse  7 mL Mouth Rinse BID  . azaTHIOprine  75 mg Oral Daily  . calcitRIOL  0.5 mcg Oral Q M,W,F-HD  . feeding supplement (RESOURCE  BREEZE)  1 Container Oral BID BM  . ferric gluconate (FERRLECIT/NULECIT) IV  62.5 mg Intravenous Q Wed-HD  . gabapentin  300 mg Oral QHS  . hydrocortisone sod succinate (SOLU-CORTEF) inj  50 mg Intravenous 4 times per day  . lanthanum  1,000 mg  Oral TID WC  . latanoprost  1 drop Both Eyes QHS  . levothyroxine  225 mcg Oral QAC breakfast  . midodrine  2.5 mg Oral TID WC  . morphine  15 mg Oral Q12H  . multivitamin  1 tablet Oral Daily  . phenytoin  100 mg Oral 3 times per day  . polycarbophil  625 mg Oral Daily  . sodium chloride  3 mL Intravenous Q12H  . tacrolimus  2 mg Oral BID  . vitamin C  500 mg Oral BID   Continuous Infusions:    Charlynne Cousins  [facility] Pager [phone]. If 8PM-8AM, please contact night-coverage at [email or web address], password [facility] 01/02/2014, 10:21 AM  LOS: 7 days

## 2014-01-03 DIAGNOSIS — M549 Dorsalgia, unspecified: Secondary | ICD-10-CM

## 2014-01-03 DIAGNOSIS — Z515 Encounter for palliative care: Secondary | ICD-10-CM

## 2014-01-03 DIAGNOSIS — D479 Neoplasm of uncertain behavior of lymphoid, hematopoietic and related tissue, unspecified: Secondary | ICD-10-CM

## 2014-01-03 MED ORDER — MORPHINE SULFATE (CONCENTRATE) 10 MG /0.5 ML PO SOLN: 20.0000 mg | ORAL | Status: AC | PRN

## 2014-01-03 MED ORDER — COLLAGENASE 250 UNIT/GM EX OINT
TOPICAL_OINTMENT | Freq: Every day | CUTANEOUS | Status: DC
  Administered 2014-01-03: 1 via TOPICAL
  Filled 2014-01-03: qty 30

## 2014-01-03 NOTE — Progress Notes (Signed)
Patient was screened by Gerlean Ren for appropriateness for an Inpatient Acute Rehab consult.  At this time, note palliative consult has been ordered.  Also note that pt. stated to [place] that he sees no benefit of rehab at this time.  We will hold off on requesting IP Rehab consult at this time.  Please notify us if pt. desires to consider IP rehab once the palliative consult is completed.  Thank you.  [place] Admissions Coordinator Cell [phone] Office [phone]

## 2014-01-03 NOTE — Progress Notes (Signed)
Note plan for hospice care at this point. No further dialysis.  Appreciate care of [facility] and palliative care teams. Will sign off.   Kelly Splinter MD (pgr) [phone]    ([phone] 01/03/2014, 12:31 PM

## 2014-01-03 NOTE — Consult Note (Signed)
WOC wound consult note Reason for Consult: evaluation of sacral pressure ulcer.  Reviewed documentation, sacral prophyalatic dressing added at the time of admission and placed air mattress in place while in the ICU.  Transferred to [place] 01/01/14 at which time assessment per bedside nurse revealed what is described as a deep tissue injury.  I was able to assess this area this am and he has 9x7cm area of deep dark marroon colored tissue with some superficial skin pealing at the wound edges.  He also has what appears to be a ruptured bulla at the periphery  of the wound from 2-5 o'clock.  Wound type:sDTI (supected deep tissue injury Pressure Ulcer POA: No Measurement: 9cm x 7cm x 0 Wound bed: see above, this pt is quite thin and I can palpate this patients coccyx bone just under the affected skin, with debridement this may evolve to a  Stage IV pressure ulcer and I have explained this to the patient and his family.  Drainage (amount, consistency, odor) minimal, serous drainage at the wound edges. Periwound: intact  Dressing procedure/placement/frequency: Will add enzymatic debridement ointment for now and reassess in a couple of days.  Pt and his family discussed his desire to speak with palliative care and how this will affect the POC for his wound.  They are aware he has declined and I have explained how ulcers like this are very challenging to manage and heal especially when the other systems are failing and he is not nutritionally optimized also.  I will ask the dietician to make recommendations for his diet to optimize him for wound healing however with ESRD protein will be an issue.  He asked me about any other options to address his wound and I did explain that surgery could debride this a little faster but I did not feel that he would be a surgical candidate which he and his family member agreed.  We can add hydrotherapy later this week after the debridement ointment softens the area a bit.  I have added  a air mattress for pressure redistribution.  The patient reports they have been turning him the whole admission and is not clear on how this ulcer could have happened.  I have explained that pressure ulcers can occur very quickly and with his other medical issues he is a higher risk.  He seems very open to palliative care at this point and very realistic about his prognosis.  I will follow up in a few days to review any changes in his goals for his care and evaluate for the need to start hydrotherapy.   WOC will follow along with you for support with this patients wound.  Pen[place], [place]

## 2014-01-03 NOTE — Progress Notes (Signed)
Patient is sleeping peacefully. repositioned every two hours and as prn. Rectal tube intact and is patent for pasty, brown stool. Patient maintained on the monitor and is V-paced with a heart-rate of 65. Will continue to monitor.  Esperanza Heir, RN

## 2014-01-03 NOTE — Consult Note (Signed)
Patient VP:XTGGY D Bunten      DOB: 06/06/1960      IRS:[id number]     Consult Note from the Palliative Medicine Team at [facility] Requested by: Dr. Joelyn Oms     PCP: Maris Berger Reason for Consultation: [facility] and options    Phone Number:None  Assessment of patients Current state: I have met with "[name]" and his wife and daughter at bedside. He tells me that he understands his tumor is unlikely to be treated and that his functional status is unlikely to improve. He describes to me a very poor quality of life that he does not want for himself. He tells me that he has made his peace and he is ready to die. He has decided to stop dialysis and we have a plan to transition to hospice care to get him closer to home. His family is upset but respectful and supportive of his decisions. Without dialysis his prognosis is likely days to week and he is open to hospice facility. I have updated Dr. Aileen Fass, Dr. Jonnie Finner, and Butch Penny CSW as to plan.    Goals of Care: 1.  Code Status: DNR   2. Scope of Treatment: Comfort care.    4. Disposition: Hopeful for hospice facility.    3. Symptom Management:   1. Pain:  Gabapentin 300 mg qhs. MS Contin 15 mg BID. Morphine 1-2 mg every 3 hours prn.  2. Bowel Regimen: Miralax daily.  Dulcolax supp daily prn.  3. Sleep: Consider lorazepam 1-2 mg qhs for sleep as needed and for agitation as well if needed.   4. Psychosocial: Emotional support provided to patient and family at bedside.    Brief HPI: 53 yo male admitted with back pain and weakness with CT results showing paraspinal mass involving T1-T3 with liver lesions as well. He was having progressive worsening pain, weakness and unable to walk, and was losing control of his bowels. PMH reviewed below.    ROS: + back pain, + weakness, + sleep disturbance    PMH:  Past Medical History  Diagnosis Date  . Secondary hyperparathyroidism, renal     In setting of ESRD.  [name] Hypothyroidism   .  Automatic implantable cardiac defibrillator in situ     CRT-ICD   . Hypertension   . Nonischemic cardiomyopathy     2D-echocardiogram (08/2007) - LV EF 45%, akinesis of inferoseptal wall, inferior wall,   . ESRD on hemodialysis Started age 62yo    on MWF schedule. Previous failed renal transplant 1998 at [facility] (with rejection in 1998). 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 Resumed HD 10/2006.   [name] DM type 1 (diabetes mellitus, type 1) DX: age 60yo    previously well controlled with Aic 5 (12/2008)  . Intracranial hemorrhage     History of. On prophylactic dilantin.  . 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  . Hyperlipidemia   . Hemopericardium     History of in setting of failed radiogrequency ablation for Vtach  . Anemia of chronic disease     BL Hgb 11-13. In setting of ESRD.   [name] Peptic ulcer disease     Unknown history, no records per [employer].   . Cholelithiasis     Noted at least since 01/2006. Asymptomatic.  . Erectile dysfunction   . Peripheral autonomic neuropathy due to diabetes mellitus   .  GERD (gastroesophageal reflux disease)   . Glaucoma   . Squamous cell skin cancer, finger     History of. Marginal resection in 05/2008 with recurrent infection/ lytic lesion involving distal phalanx  . Toe amputation status     Ischemic fourth and fifith toe left (03/2009), history of previous 1,2,3 toe  amputations.   [name] MRSA (methicillin resistant staph aureus) culture positive     Verify type - Per medical history form dated 02/13/10.  [name] Poor circulation     per medical history form dated 02/13/10.  . Ventricular tachycardia     appropriate ATP and Shocks 12/12  . RIATA LEAD     EXTERNALIZATION 12/12 (sk) // high RV threshold  . S/P BKA (below knee amputation)     right  . Arthritis   . Blood transfusion without reported diagnosis   . Heart  murmur   . Coronary artery disease   . ICD (implantable cardiac defibrillator) in place   . Legally blind   . Arteriovenous graft for hemodialysis in place, primary 2009    left leg, had a previous graft in right arm that occulded  . Chronic cholecystitis with calculus s/p lap chole Feb 2014 03/11/2012     PSH: Past Surgical History  Procedure Laterality Date  . Cardiac defibrillator placement  11/05    St Jude  . Transplant pancreatic allograft  2002  . Toe amputation  03/2009    left 4th, 5th toes. Previous 1,2, 3 toe left.  . Kidney transplant  2002  . Above knee leg amputation  11/09/10    Right AKA  . Ablation      2006  . Cholecystectomy N/A 04/16/2012    Procedure: LAPAROSCOPIC CHOLECYSTECTOMY WITH INTRAOPERATIVE CHOLANGIOGRAM;  Surgeon: Adin Hector, MD;  Location: [facility];  Service: General;  Laterality: N/A;  . Coronary artery bypass graft    . Vascular surgery    . Eye surgery    . Insert / replace / remove pacemaker     I have reviewed the [place] and SH and  If appropriate update it with new information. Allergies  Allergen Reactions  . Ceftriaxone Sodium Nausea And Vomiting  . Dilaudid [Hydromorphone Hcl]     Says it "builds up in his system" because of renal failure  . Protamine     Significant hypotension  . Rocephin [Ceftriaxone Sodium In Dextrose] Nausea And Vomiting    Rocephin injection with xylocaine  . Avelox [Moxifloxacin Hcl In Nacl] Rash    Ask patient to clarify type of medication. Also need to know type/severity/reaction.  . Ciprofloxacin Rash  . Moxifloxacin Rash   Scheduled Meds: . amiodarone  200 mg Oral QPM  . antiseptic oral rinse  7 mL Mouth Rinse BID  . azaTHIOprine  75 mg Oral Daily  . calcitRIOL  0.5 mcg Oral Q M,W,F-HD  . collagenase   Topical Daily  . feeding supplement (RESOURCE BREEZE)  1 Container Oral BID BM  . ferric gluconate (FERRLECIT/NULECIT) IV  62.5 mg Intravenous Q Wed-HD  . gabapentin  300 mg Oral QHS  . hydrocortisone  sod succinate (SOLU-CORTEF) inj  50 mg Intravenous 4 times per day  . lanthanum  1,000 mg Oral TID WC  . latanoprost  1 drop Both Eyes QHS  . levothyroxine  225 mcg Oral QAC breakfast  . midodrine  2.5 mg Oral TID WC  . morphine  15 mg Oral Q12H  . multivitamin  1 tablet Oral Daily  . phenytoin  100 mg Oral 3 times per day  . polycarbophil  625 mg Oral Daily  . sodium chloride  3 mL Intravenous Q12H  . tacrolimus  2 mg Oral BID  . vitamin C  500 mg Oral BID   Continuous Infusions:  PRN Meds:.acetaminophen, dextrose, morphine injection, nitroGLYCERIN    BP 86/42 mmHg  Pulse 60  Temp(Src) 98.2 F (36.8 C) (Oral)  Resp 18  Ht '5\' 10"'  (1.778 m)  Wt 65.4 kg (144 lb 2.9 oz)  BMI 20.69 kg/m2  SpO2 98%   PPS: 30%   Intake/Output Summary (Last 24 hours) at 01/03/14 1055 Last data filed at 01/03/14 1049  Gross per 24 hour  Intake    823 ml  Output      0 ml  Net    823 ml   LBM: 11/2  Physical Exam:  General: NAD, chronically ill appearing HEENT: [place]/AT, no JVD, moist mucous membranes Chest: CTA throughout, no labored breathing CVS: RRR, S1 S2 Abdomen: Soft, NT, ND, +BS Ext: No edema, unable to move LLE, decreased sensation BLE, old Rt AKA Neuro: Awake, alert, oriented x 3  Labs: CBC    Component Value Date/Time   WBC 18.6* 12/31/2013 1750   RBC 3.26* 12/31/2013 1750   HGB 10.4* 12/31/2013 1750   HCT 31.1* 12/31/2013 1750   PLT 246 12/31/2013 1750   MCV 95.4 12/31/2013 1750   MCH 31.9 12/31/2013 1750   MCHC 33.4 12/31/2013 1750   RDW 16.0* 12/31/2013 1750   LYMPHSABS 0.7 12/26/2013 1348   MONOABS 1.3* 12/26/2013 1348   EOSABS 0.1 12/26/2013 1348   BASOSABS 0.0 12/26/2013 1348    BMET    Component Value Date/Time   NA 135* 01/02/2014 0351   K 3.6* 01/02/2014 0351   CL 94* 01/02/2014 0351   CO2 24 01/02/2014 0351   GLUCOSE 118* 01/02/2014 0351   BUN 62* 01/02/2014 0351   CREATININE 4.87* 01/02/2014 0351   CALCIUM 8.4 01/02/2014 0351   GFRNONAA 12*  01/02/2014 0351   GFRAA 14* 01/02/2014 0351    CMP     Component Value Date/Time   NA 135* 01/02/2014 0351   K 3.6* 01/02/2014 0351   CL 94* 01/02/2014 0351   CO2 24 01/02/2014 0351   GLUCOSE 118* 01/02/2014 0351   BUN 62* 01/02/2014 0351   CREATININE 4.87* 01/02/2014 0351   CALCIUM 8.4 01/02/2014 0351   PROT 6.3 12/28/2013 0319   ALBUMIN 2.0* 12/31/2013 1750   AST 123* 12/28/2013 0319   ALT 102* 12/28/2013 0319   ALKPHOS 379* 12/28/2013 0319   BILITOT 0.3 12/28/2013 0319   GFRNONAA 12* 01/02/2014 0351   GFRAA 14* 01/02/2014 0351     Time In Time Out Total Time Spent with Patient Total Overall Time  0930 1055 58mn 865m    Greater than 50%  of this time was spent counseling and coordinating care related to the above assessment and plan.  AlVinie SillNP Palliative Medicine Team Pager # [phone]-F 8a-5p) Team Phone # [phone]/Weekends)

## 2014-01-03 NOTE — Progress Notes (Signed)
FOLLOW-UP NUTRITION ASSESSMENT  Intervention: No further nutrition interventions warranted at this time, pt is being discharged with home hospic  Nutrition Diagnosis: Increased nutrient needs related to ESRD on HD, skin integrity as evidenced by estimated nutrition needs; ongoing  Goal:Pt to meet >/= 90% of their estimated nutrition needs ; discontinued/unmet New goal: Comfort feeds  Monitor:  PO & supplemental intake, weight, labs, I/O's  53 y.o. male  Admitting Dx: Mass of thoracic vertebra  ASSESSMENT: 53 y.o. Male with PMH of ESRD on dialysis (M/W/F), pancreas and kidney transplantation, CHF, ICD, hypertension, GERD, hypothyroidism, hyperlipidemia, who presented with back pain.   CT scan demonstrates a paraspinous mass with involvement of the T1-T2 and T3 vertebrae.  Per chart, pt has decided to stop dialysis with plan to transition to hospice care; without dialysis his prognisis is likely weeks to days. Over the past few days pt has been eating anywhere from 5% to 100% of meals. His weight has trended up. No nutrition interventions warranted at this time.  Height: Ht Readings from Last 1 Encounters:  12/30/13 5\' 10"  (1.778 m)    Weight: Wt Readings from Last 1 Encounters:  01/03/14 144 lb 2.9 oz (65.4 kg)   BMI:  20.8 kg/m2 -- adjusted for BKA  Estimated Nutritional Needs: Kcal: 1850-2050 Protein: 90-100 gm Fluid: 1200 ml  Skin: Stage I pressure ulcer to sacrum  Diet Order: Diet renal/carb modified with 1200 ml fluid restriction Diet - low sodium heart healthy   Intake/Output Summary (Last 24 hours) at 01/03/14 1648 Last data filed at 01/03/14 1401  Gross per 24 hour  Intake    633 ml  Output      0 ml  Net    633 ml    Labs:   Recent Labs Lab 12/29/13 1252 12/31/13 0355 12/31/13 1750 01/02/14 0351  NA 135* 137 135* 135*  K 5.4* 5.4* 5.9* 3.6*  CL 93* 96 93* 94*  CO2 25 22 23 24   BUN 67* 64* 78* 62*  CREATININE 6.10* 5.02* 5.81* 4.87*   CALCIUM 8.6 8.7 8.8 8.4  PHOS 4.9* 3.9 4.4  --   GLUCOSE 132* 126* 128* 118*    CBG (last 3)  No results for input(s): GLUCAP in the last 72 hours.  Scheduled Meds: . amiodarone  200 mg Oral QPM  . antiseptic oral rinse  7 mL Mouth Rinse BID  . azaTHIOprine  75 mg Oral Daily  . collagenase   Topical Daily  . feeding supplement (RESOURCE BREEZE)  1 Container Oral BID BM  . gabapentin  300 mg Oral QHS  . hydrocortisone sod succinate (SOLU-CORTEF) inj  50 mg Intravenous 4 times per day  . latanoprost  1 drop Both Eyes QHS  . levothyroxine  225 mcg Oral QAC breakfast  . midodrine  2.5 mg Oral TID WC  . morphine  15 mg Oral Q12H  . phenytoin  100 mg Oral 3 times per day  . polycarbophil  625 mg Oral Daily  . sodium chloride  3 mL Intravenous Q12H  . tacrolimus  2 mg Oral BID    Continuous Infusions:   [name] RD, LDN Inpatient Clinical Dietitian Pager: [phone] After Hours Pager: [phone]

## 2014-01-03 NOTE — Progress Notes (Signed)
PT Cancellation Note  Patient Details Name: [name] MRN: [id number] DOB: [birth year]   Cancelled Treatment:    Reason Eval/Treat Not Completed: Other (comment) (patient for hospice/ comfort care at this time), no further acute PT needs, will sign off.   Duncan Dull 01/03/2014, 3:37 PM Alben Deeds, [place] DPT  [phone]

## 2014-01-03 NOTE — Progress Notes (Signed)
Full note to follow:  I have met with "[name]" and his wife and daughter at bedside. He tells me that he understands his tumor is unlikely to be treated and that his functional status is unlikely to improve. He describes to me a very poor quality of life that he does not want for himself. He tells me that he has made his peace and he is ready to die. He has decided to stop dialysis and we have a plan to transition to hospice care to get him closer to home. His family is upset but respectful and supportive of his decisions. Without dialysis his prognosis is likely days to week. I have updated Dr. Aileen Fass, Dr. Jonnie Finner, and Butch Penny CSW as to plan.   Vinie Sill, NP Palliative Medicine Team Pager # [phone] (M-F 8a-5p) Team Phone # [phone] (Nights/Weekends)

## 2014-01-03 NOTE — Progress Notes (Signed)
[facility] PROGRESS NOTE Brief Narrative: 53 y.o. 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 He was being evaluated by his orthopedic surgeon who was planning on doing a CT scan. Unfortunately patient developed severe weakness in his left leg (he s/p R AKA). 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 Seen by neurosurgery, not felt to be a surgical candidate, underwent liver biopsy by interventional radiology.    Assessment/Plan:  Paraspinal mass T1-T3  - Complete paralysis of lower extremities with a sensory level at approximately T3 or T4 - lesions at T1-T2 and T3 area with epidural and paraspinous tumor  - high dose steroids - surgical intervention not felt to offer benefit  - pain controlled: MS contin and morphine prn  - IR liver Biopsy on 10.28.2015 results pending. CEA high. - He would like to meet with PMT as he is realizing thing are going to get worse.  Fever and Hypotension  - Multifactorial likely, due to ESRD, leukocytosis improving with out antibiotics. - Check BC, c. Dif PCR( He is having diarrhea) and CXR. Speech consult. - worsening WBC (he is on steroids)  Decubitus ulcer stage 4 - WOC. - add enzymatic debridement ointment. - add hydrotherapy later this week   Chronic systolic CHF  - 2-D echo 2/35/5 showed EF of 45% - volume control per HD   ESRD on HD  - Dialysis on Monday/Wednesday/Friday - Nephrology following   Hx of pancreas/kidney transplantation - renal failed - pancreas functional  - continue home immunosuppressive meds   Hypothyroidism  - TSH was 5.1 nl free t4  S/P AICD   DM  CBG well controlled  HLD   Anemia of chronic kidney disease   Code Status: dnr Family  Communication: wife  Disposition Plan: inpatient   Consultants:  Neuro  Nephrology  IR  Procedures:  Liver biosy  Antibiotics:  None  HPI/Subjective: No complains.  Objective: Filed Vitals:   01/02/14 2004 01/03/14 0132 01/03/14 0613 01/03/14 0700  BP: 90/42 96/46 84/39  86/42  Pulse: 65 59 58 60  Temp: 98.5 F (36.9 C) 98.5 F (36.9 C) 98.2 F (36.8 C)   TempSrc: Oral Oral Oral   Resp: 18 18 18 18   Height:      Weight:   65.4 kg (144 lb 2.9 oz)   SpO2: 94% 98% 94% 98%    Intake/Output Summary (Last 24 hours) at 01/03/14 1014 Last data filed at 01/03/14 0932  Gross per 24 hour  Intake    820 ml  Output      0 ml  Net    820 ml   Filed Weights   01/01/14 0711 01/02/14 0600 01/03/14 0613  Weight: 62.7 kg (138 lb 3.7 oz) 62 kg (136 lb 11 oz) 65.4 kg (144 lb 2.9 oz)    Exam:  General:  in no acute distress.  HEENT: No bruits, no goiter.  Heart: Regular rate and rhythm Lungs: Good air movement, clear Abdomen: Soft, nontender, nondistended, positive bowel sounds.     Data Reviewed: Basic Metabolic Panel:  Recent Labs Lab 12/28/13 0319 12/29/13 1252 12/31/13 0355 12/31/13 1750 01/02/14 0351  NA 140 135* 137 135* 135*  K 4.4 5.4* 5.4* 5.9* 3.6*  CL 97 93* 96 93* 94*  CO2 26 25 22  23  24  GLUCOSE 140* 132* 126* 128* 118*  BUN 32* 67* 64* 78* 62*  CREATININE 3.89* 6.10* 5.02* 5.81* 4.87*  CALCIUM 8.8 8.6 8.7 8.8 8.4  PHOS  --  4.9* 3.9 4.4  --    Liver Function Tests:  Recent Labs Lab 12/28/13 0319 12/29/13 1252 12/31/13 0355 12/31/13 1750  AST 123*  --   --   --   ALT 102*  --   --   --   ALKPHOS 379*  --   --   --   BILITOT 0.3  --   --   --   PROT 6.3  --   --   --   ALBUMIN 2.1* 2.1* 1.8* 2.0*   No results for input(s): LIPASE, AMYLASE in the last 168 hours. No results for input(s): AMMONIA in the last 168 hours. CBC:  Recent Labs Lab 12/28/13 0319 12/29/13 1252 12/29/13 2354 12/31/13 1750  WBC 14.4* 15.4* 15.0* 18.6*    HGB 9.8* 9.9* 10.4* 10.4*  HCT 30.1* 30.7* 31.6* 31.1*  MCV 98.7 99.0 97.2 95.4  PLT 204 229 214 246   Cardiac Enzymes: No results for input(s): CKTOTAL, CKMB, CKMBINDEX, TROPONINI in the last 168 hours. BNP (last 3 results) No results for input(s): PROBNP in the last 8760 hours. CBG:  Recent Labs Lab 12/28/13 0752 12/28/13 1242 12/29/13 0818 12/30/13 0754 12/30/13 1647  GLUCAP 135* 128* 139* 127* 119*    Recent Results (from the past 240 hour(s))  MRSA PCR Screening     Status: None   Collection Time: 12/26/13 11:15 PM  Result Value Ref Range Status   MRSA by PCR NEGATIVE NEGATIVE Final    Comment:        The GeneXpert MRSA Assay (FDA approved for NASAL specimens only), is one component of a comprehensive MRSA colonization surveillance program. It is not intended to diagnose MRSA infection nor to guide or monitor treatment for MRSA infections.  Culture, blood (routine x 2)     Status: None (Preliminary result)   Collection Time: 01/01/14  8:41 AM  Result Value Ref Range Status   Specimen Description BLOOD RIGHT ARM  Final   Special Requests BOTTLES DRAWN AEROBIC AND ANAEROBIC 10CC  Final   Culture  Setup Time   Final    01/01/2014 20:10 Performed at [facility]    Culture   Final           BLOOD CULTURE RECEIVED NO GROWTH TO DATE CULTURE WILL BE HELD FOR 5 DAYS BEFORE ISSUING A FINAL NEGATIVE REPORT Performed at [facility]    Report Status PENDING  Incomplete  Culture, blood (routine x 2)     Status: None (Preliminary result)   Collection Time: 01/01/14 10:15 AM  Result Value Ref Range Status   Specimen Description BLOOD RIGHT WRIST  Final   Special Requests BOTTLES DRAWN AEROBIC AND ANAEROBIC 10CC  Final   Culture  Setup Time   Final    01/01/2014 20:10 Performed at [facility]    Culture   Final           BLOOD CULTURE RECEIVED NO GROWTH TO DATE CULTURE WILL BE HELD FOR 5 DAYS BEFORE ISSUING A FINAL NEGATIVE  REPORT Performed at [facility]    Report Status PENDING  Incomplete  Clostridium Difficile by PCR     Status: None   Collection Time: 01/01/14 12:51 PM  Result Value Ref Range Status   C difficile by pcr NEGATIVE  NEGATIVE Final     Studies: Dg Chest Port 1 View  01/01/2014   CLINICAL DATA:  Fever for 2 days which short of breath.  EXAM: PORTABLE CHEST - 1 VIEW  COMPARISON:  Radiograph 12/22/2013  FINDINGS: Right-sided defibrillator noted. Sternotomy wires overlie stable mildly enlarged cardiac silhouette. Patient is rotated leftward which does exaggerates the right hilum. No overt pulmonary edema. No pleural fluid. No pneumothorax.  IMPRESSION: No significant change.  No acute cardiopulmonary findings.   Electronically Signed   By: Suzy Bouchard M.D.   On: 01/01/2014 12:23    Scheduled Meds: . amiodarone  200 mg Oral QPM  . antiseptic oral rinse  7 mL Mouth Rinse BID  . azaTHIOprine  75 mg Oral Daily  . calcitRIOL  0.5 mcg Oral Q M,W,F-HD  . collagenase   Topical Daily  . feeding supplement (RESOURCE BREEZE)  1 Container Oral BID BM  . ferric gluconate (FERRLECIT/NULECIT) IV  62.5 mg Intravenous Q Wed-HD  . gabapentin  300 mg Oral QHS  . hydrocortisone sod succinate (SOLU-CORTEF) inj  50 mg Intravenous 4 times per day  . lanthanum  1,000 mg Oral TID WC  . latanoprost  1 drop Both Eyes QHS  . levothyroxine  225 mcg Oral QAC breakfast  . midodrine  2.5 mg Oral TID WC  . morphine  15 mg Oral Q12H  . multivitamin  1 tablet Oral Daily  . phenytoin  100 mg Oral 3 times per day  . polycarbophil  625 mg Oral Daily  . sodium chloride  3 mL Intravenous Q12H  . tacrolimus  2 mg Oral BID  . vitamin C  500 mg Oral BID   Continuous Infusions:    Charlynne Cousins  [facility] Pager [phone]. If 8PM-8AM, please contact night-coverage at [email or web address], password [facility] 01/03/2014, 10:14 AM  LOS: 8 days

## 2014-01-03 NOTE — Care Management Note (Signed)
    Page 1 of 1   01/03/2014     4:45:36 PM CARE MANAGEMENT NOTE 01/03/2014  Patient:  [name], [name]   Account Number:  [id number]  Date Initiated:  01/03/2014  Documentation initiated by:  [facility]  Subjective/Objective Assessment:   53 y.o. 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   (If response is "NO", the following Medicare IM given date fields will be blank) Date Medicare IM given:   Medicare IM given by:   Date Additional Medicare IM given:   Additional Medicare IM given by:    Discharge Disposition:  [place]  Per UR Regulation:  Reviewed for med. necessity/level of care/duration of stay  If discussed at [place] of Stay Meetings, dates discussed:    Comments:  01/03/14 Fuller Mandril, RN, BSN, NCM [phone] Note plan for hospice care at this point. No further dialysis.

## 2014-01-03 NOTE — Discharge Summary (Addendum)
Physician Discharge Summary  [name] WVP:[id number] DOB: 18-Nov-1960 DOA: 12/26/2013  PCP: Maris Berger  Admit date: 12/26/2013 Discharge date: 01/05/2014  Time spent: 35 minutes  Recommendations for Outpatient Follow-up:  He will go to [facility].  Discharge Diagnoses:  Principal Problem:   Lymphoproliferative disorder Active Problems:   Hypothyroidism   Essential hypertension   Chronic systolic heart failure   End stage renal disease   Automatic implantable cardioverter-defibrillator in situ   S/P Right AKA (above knee amputation)   H/O pancreas/kidney transplant 2002 [facility]   GERD (gastroesophageal reflux disease)   Mass of thoracic vertebra   Discharge Condition: guarded  Diet recommendation: comfort feeds  Filed Weights   01/01/14 0711 01/02/14 0600 01/03/14 0613  Weight: 62.7 kg (138 lb 3.7 oz) 62 kg (136 lb 11 oz) 65.4 kg (144 lb 2.9 oz)    History of present illness:  53 y.o. 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  Patient reports that his back pain started 6 weeks ago. It is located over the right side of her upper back, then it involved left upper back. He was prescribed with muscle relaxant by his renal doctor, which did not help. His back pain has been progressively getting worse. It started radiating to the R arm. 11 days ago patient was evaluated by orthopedic surgeon in [place]. He was treated with tapering dose steroids which also did not help. On 10/22, patient started having severe weakness over his left leg (he is s/p of R AKA). He could not walk anymore. Therefore he went back to his orthopedic surgeon again who planned to do a CT scan, but has not been done yet. Because of worsening symptoms, patient comes to hospital for further evaluation and  treatment. Patient denies fever, chills, shortness of breath, chest pain, abdominal pain, diarrhea.  Hospital Course:  Paraspinal mass T1-T3  - Complete paralysis of lower extremities with a sensory level at approximately T3 or T4 - lesions at T1-T2 and T3 area with epidural and paraspinous tumor  - high dose steroids - surgical intervention not felt to offer benefit  - pain controlled: MS contin and morphine prn  - IR liver Biopsy on 10.28.2015 results pending. CEA high. - He met with PMT and decided to move towards comfort care. - stop all meds cont roxanol and BZD's for comfort.  Fever and Hypotension  - Multifactorial likely, due to ESRD, leukocytosis improving with out antibiotics. - Check BC, c. Dif PCR( He is having diarrhea) and CXR. Speech consult. - worsening WBC (he is on steroids)  Decubitus ulcer stage 4 - WOC. - add enzymatic debridement ointment. - add hydrotherapy later this week   Chronic systolic CHF  - 2-D echo 5/46/2 showed EF of 45% - volume control per HD   ESRD on HD  - Dialysis on Monday/Wednesday/Friday. - he decided to stop HD  Hx of pancreas/kidney transplantation - renal failed - pancreas functional  Hypothyroidism    S/P AICD   DM  CBG well controlled  HLD   Anemia of chronic kidney disease  Procedures:  noen  Consultations:  Renal PCCM  Discharge Exam: Filed Vitals:   01/03/14 1500  BP: 80/41  Pulse: 60  Temp:   Resp: 6    General: A&O x3 Cardiovascular: RRR Respiratory: good air movement  Discharge Instructions You were cared for  by a hospitalist during your hospital stay. If you have any questions about your discharge medications or the care you received while you were in the hospital after you are discharged, you can call the unit and asked to speak with the hospitalist on call if the hospitalist that took care of you is not available. Once you are discharged, your primary care physician will handle any  further medical issues. Please note that NO REFILLS for any discharge medications will be authorized once you are discharged, as it is imperative that you return to your primary care physician (or establish a relationship with a primary care physician if you do not have one) for your aftercare needs so that they can reassess your need for medications and monitor your lab values.  Discharge Instructions    Diet - low sodium heart healthy    Complete by:  As directed      Increase activity slowly    Complete by:  As directed           Discharge Medication List as of 01/03/2014  5:41 PM    START taking these medications   Details  Morphine Sulfate (MORPHINE CONCENTRATE) 10 mg / 0.5 ml concentrated solution Place 1 mL (20 mg total) under the tongue every 2 (two) hours as needed for severe pain., Starting 01/03/2014, Until Discontinued, Normal      CONTINUE these medications which have NOT CHANGED   Details  bimatoprost (LUMIGAN) 0.03 % ophthalmic solution Place 1 drop into both eyes at bedtime. , Until Discontinued, Historical Med    predniSONE (DELTASONE) 5 MG tablet Take 5 mg by mouth daily. , Until Discontinued, Historical Med      STOP taking these medications     acetaminophen (TYLENOL) 500 MG tablet      amiodarone (PACERONE) 200 MG tablet      azaTHIOprine (IMURAN) 50 MG tablet      Calcium Polycarbophil (EQUALACTIN PO)      carvedilol (COREG) 3.125 MG tablet      gabapentin (NEURONTIN) 100 MG capsule      HYDROcodone-acetaminophen (LORTAB) 10-500 MG per tablet      lanthanum (FOSRENOL) 1000 MG chewable tablet      levothyroxine (SYNTHROID, LEVOTHROID) 75 MCG tablet      loperamide (IMODIUM) 2 MG capsule      Lutein 20 MG TABS      multivitamin (RENA-VIT) TABS tablet      nitroGLYCERIN (NITROSTAT) 0.4 MG SL tablet      nizatidine (AXID) 150 MG capsule      Omega-3 Fatty Acids (SALMON OIL-1000 PO)      phenytoin (DILANTIN) 100 MG ER capsule      rosuvastatin  (CRESTOR) 5 MG tablet      tacrolimus (PROGRAF) 1 MG capsule      vitamin C (ASCORBIC ACID) 500 MG tablet        Allergies  Allergen Reactions  . Ceftriaxone Sodium Nausea And Vomiting  . Dilaudid [Hydromorphone Hcl]     Says it "builds up in his system" because of renal failure  . Protamine     Significant hypotension  . Rocephin [Ceftriaxone Sodium In Dextrose] Nausea And Vomiting    Rocephin injection with xylocaine  . Avelox [Moxifloxacin Hcl In Nacl] Rash    Ask patient to clarify type of medication. Also need to know type/severity/reaction.  . Ciprofloxacin Rash  . Moxifloxacin Rash      The results of significant diagnostics from this hospitalization (including imaging,  microbiology, ancillary and laboratory) are listed below for reference.    Significant Diagnostic Studies: Dg Chest 2 View  12/22/2013   CLINICAL DATA:  53 year old male with chest pain for 1 month. Associated left lower extremity numbness and weakness. Initial encounter.  EXAM: CHEST  2 VIEW  COMPARISON:  11/23/2013 and earlier.  FINDINGS: Stable right chest cardiac AICD. Stable cardiomegaly and mediastinal contours. Visualized tracheal air column is within normal limits. Multilevel chronic left rib fractures. No pneumothorax, pulmonary edema, pleural effusion or confluent pulmonary opacity. Osteopenia. No acute osseous abnormality identified.  IMPRESSION: No acute cardiopulmonary abnormality.   Electronically Signed   By: Lars Pinks M.D.   On: 12/22/2013 20:22   Ct Chest W Contrast  12/26/2013   ADDENDUM REPORT: 12/26/2013 18:02  ADDENDUM: Addendum for further clarification.  The right paraspinal lesion demonstrates a significant soft tissue component which extends superiorly, adjacent to the right posterior spinous process at T1, where it measures approximately 2.5 x 3.3 cm (series 21/image 4).  Inferiorly/posteriorly, the tumor extends along the posterior spinous process at T3 (series 201/image 10), with  suspected pathologic fracture (series 223/image 67).  The differential for this lesion includes malignant nerve sheath tumors given the general location. However, the unusual appearance, as well as the liver lesions, raise the possibility that this is an osseous/extraosseous metastasis from an unknown primary.  The portion of the lesion which is adjacent to the posterior spinous processes at T1 or T3 may provide a convenient location for percutaneous sampling.   Electronically Signed   By: Julian Hy M.D.   On: 12/26/2013 18:02   12/26/2013   CLINICAL DATA:  Chest pain. Evaluate for intra-abdominal abscess or vascular issue. Evaluate for epidural abscess, discitis, or cord compression. History of right above knee amputation, CAD, kidney transplant. Now with ESRD on dialysis.  EXAM: CT CHEST, ABDOMEN, AND PELVIS WITH CONTRAST  TECHNIQUE: Multidetector CT imaging of the chest, abdomen and pelvis was performed following the standard protocol during bolus administration of intravenous contrast.  CONTRAST:  19mL OMNIPAQUE IOHEXOL 300 MG/ML  SOLN  COMPARISON:  [place] CT abdomen dated 11/12/2011. [place] [facility]A chest dated 06/18/2011. [facility] CT abdomen pelvis dated 12/03/2008.  FINDINGS: CT CHEST FINDINGS  Mildly dependent atelectasis in the lung bases. Trace bilateral pleural effusions. No suspicious pulmonary nodules. Mild emphysematous changes. No pneumothorax.  Visualized thyroid is heterogeneous.  Cardiomegaly. No pericardial effusion. Coronary atherosclerosis. Atherosclerotic calcifications of the aortic arch. Right subclavian ICD.  Small mediastinal lymph nodes which do not meet both CT size criteria.  Mild wall thickening involving the mid/ distal esophagus, nonspecific.  Right paraspinal soft tissue mass, measuring at least 1.9 x 3.3 x 1.9 cm (series 201/images 9-12), with associated destruction/pathologic fracture of the right T2 transverse process (series 222/image 63). Lesion is posterior to the  right lung apex but favored to be extrapleural in location. Additional abnormal soft tissue likely extends posterior to the right T2 transverse process (series 21/image 7).  Degenerative changes of the thoracic spine. Multiple old left rib fracture deformities.  CT ABDOMEN AND PELVIS FINDINGS  Hepatobiliary: 3.6 x 6.0 cm peripherally enhancing lesion in the medial segment left hepatic lobe (series 21/image 58), new. 3.9 x 4.6 cm peripherally enhancing low-density lesion in the posterior segment right hepatic lobe (series 21/image 26), new. Given the perispinal finding, these are suspicious for necrotic metastases. Abscesses are also possible.  Background heterogeneous perfusion of the liver.  Status post cholecystectomy. No intrahepatic or extrahepatic ductal dilatation.  Pancreas:  Parenchymal atrophy.  Spleen: Within normal limits.  Adrenals/Urinary Tract: Adrenal glands are unremarkable.  Status post right nephrectomy. Native left renal atrophy with multiple probable acquired renal cysts. Transplant kidney in the left lower quadrant with additional small cysts. No hydronephrosis.  Bladder is thick-walled although underdistended.  Stomach/Bowel: Stomach is unremarkable.  No evidence of bowel obstruction.  Extensive colonic diverticulosis, without associated inflammatory changes.  Vascular/Lymphatic: Atherosclerotic calcifications of the abdominal aorta and branch vessels.  No suspicious abdominopelvic lymphadenopathy.  Reproductive: Prostate is grossly unremarkable.  Other: No abdominopelvic ascites.  Musculoskeletal: Degenerative changes of the lumbar spine.  IMPRESSION: Right paraspinal soft tissue mass, measuring at least 3.3 cm, worrisome for primary or metastatic malignancy. Associated osseous destruction/pathologic fracture of the right T2 transverse process.  Two hepatic lesions measuring up to 6.0 cm, worrisome for metastases, less likely abscesses.  Additional ancillary findings as above.  Electronically  Signed: By: Julian Hy M.D. On: 12/26/2013 17:44   Ct Thoracic Spine W Contrast  12/26/2013   CLINICAL DATA:  Back pain. Evaluate for epidural abscess, discitis, or cord compression.  EXAM: CT THORACIC SPINE WITH CONTRAST  TECHNIQUE: Multidetector CT imaging of the thoracic spine was performed using the standard protocol following bolus administration of intravenous contrast. Multiplanar CT image reconstructions were also generated.  CONTRAST:  15mL OMNIPAQUE IOHEXOL 300 MG/ML  SOLN  COMPARISON:  None.  FINDINGS: Right paraspinal tumor is present, centered along the right T2 transverse process, with associated pathologic fracture/osseous destruction (series 206/image 19).  Tumor extends superiorly adjacent to the right T1 posterior spinous process (series 207/image 1), where measures approximately 2.7 x 3.2 cm.  Tumor extends laterally/anteriorly along the posterior right lung apex (series 207/image 13-20), where it measures approximately 1.9 x 3.5 cm.  Tumor extends posteriorly cyst inferiorly along the T3 posterior spinous process (series 207/image 10), where measures approximately 3.7 x 4.3 cm, with associated pathologic fracture (series 206/images 21-22).  Although difficult to definitively state on CT, there is no convincing tumor extension into the spinal canal (series 210/image 27).  This appearance favors osseous/extraosseous metastases, less likely malignant nerve sheath tumor.  Degenerative changes of the lower cervical spine.  Exaggerated thoracic kyphosis.  Mild multilevel degenerative changes of the thoracic spine. Vertebral body heights are maintained.  Spinal canal remains patent.  Renal osteodystrophy.  IMPRESSION: Right paraspinal tumor along T1-3, as described above. This appearance favors osseous/extraosseous metastases, less likely malignant nerve sheath tumor.  Associated osseous destruction with pathologic fracture involving the right T2 transverse process and T3 posterior spinous  process.  No definite extension into the spinal canal.   Electronically Signed   By: Julian Hy M.D.   On: 12/26/2013 18:13   Ct Lumbar Spine W Contrast  12/26/2013   CLINICAL DATA:  Back pain, weakness. Evaluate for epidural abscess, discitis, or cord compression.  EXAM: CT LUMBAR SPINE WITH CONTRAST  TECHNIQUE: Multidetector CT imaging of the lumbar spine was performed with intravenous contrast administration. Multiplanar CT image reconstructions were also generated.  CONTRAST:  164mL OMNIPAQUE IOHEXOL 300 MG/ML  SOLN  COMPARISON:  None.  FINDINGS: Five lumbar type vertebral bodies.  Normal lumbar lordosis.  No evidence of fracture or dislocation. Vertebral body heights are maintained.  Mild multilevel degenerative changes.  Spinal canal is widely patent.  Small posterior disc bulges at L3-4, L4-5, and L5-S1 (series 217/ image 41).  Mild lateral recess stenosis with facet arthropathy at L4-5 and L5-S1.  No evidence of epidural abscess or discitis osteomyelitis.  Liver  lesion in the posterior segment right hepatic lobe (series 227/image 48), better visualized on CT abdomen/pelvis.  IMPRESSION: Small posterior disc bulges at L3-4, L4-5, and L5-S1.  Spinal canal remains patent.  No evidence of epidural abscess or discitis osteomyelitis.   Electronically Signed   By: Julian Hy M.D.   On: 12/26/2013 18:21   Ct Abdomen Pelvis W Contrast  12/26/2013   ADDENDUM REPORT: 12/26/2013 18:02  ADDENDUM: Addendum for further clarification.  The right paraspinal lesion demonstrates a significant soft tissue component which extends superiorly, adjacent to the right posterior spinous process at T1, where it measures approximately 2.5 x 3.3 cm (series 21/image 4).  Inferiorly/posteriorly, the tumor extends along the posterior spinous process at T3 (series 201/image 10), with suspected pathologic fracture (series 223/image 67).  The differential for this lesion includes malignant nerve sheath tumors given the  general location. However, the unusual appearance, as well as the liver lesions, raise the possibility that this is an osseous/extraosseous metastasis from an unknown primary.  The portion of the lesion which is adjacent to the posterior spinous processes at T1 or T3 may provide a convenient location for percutaneous sampling.   Electronically Signed   By: Julian Hy M.D.   On: 12/26/2013 18:02   12/26/2013   CLINICAL DATA:  Chest pain. Evaluate for intra-abdominal abscess or vascular issue. Evaluate for epidural abscess, discitis, or cord compression. History of right above knee amputation, CAD, kidney transplant. Now with ESRD on dialysis.  EXAM: CT CHEST, ABDOMEN, AND PELVIS WITH CONTRAST  TECHNIQUE: Multidetector CT imaging of the chest, abdomen and pelvis was performed following the standard protocol during bolus administration of intravenous contrast.  CONTRAST:  139mL OMNIPAQUE IOHEXOL 300 MG/ML  SOLN  COMPARISON:  [place] CT abdomen dated 11/12/2011. [place] CTA chest dated 06/18/2011. [facility] CT abdomen pelvis dated 12/03/2008.  FINDINGS: CT CHEST FINDINGS  Mildly dependent atelectasis in the lung bases. Trace bilateral pleural effusions. No suspicious pulmonary nodules. Mild emphysematous changes. No pneumothorax.  Visualized thyroid is heterogeneous.  Cardiomegaly. No pericardial effusion. Coronary atherosclerosis. Atherosclerotic calcifications of the aortic arch. Right subclavian ICD.  Small mediastinal lymph nodes which do not meet both CT size criteria.  Mild wall thickening involving the mid/ distal esophagus, nonspecific.  Right paraspinal soft tissue mass, measuring at least 1.9 x 3.3 x 1.9 cm (series 201/images 9-12), with associated destruction/pathologic fracture of the right T2 transverse process (series 222/image 63). Lesion is posterior to the right lung apex but favored to be extrapleural in location. Additional abnormal soft tissue likely extends posterior to the right T2  transverse process (series 21/image 7).  Degenerative changes of the thoracic spine. Multiple old left rib fracture deformities.  CT ABDOMEN AND PELVIS FINDINGS  Hepatobiliary: 3.6 x 6.0 cm peripherally enhancing lesion in the medial segment left hepatic lobe (series 21/image 58), new. 3.9 x 4.6 cm peripherally enhancing low-density lesion in the posterior segment right hepatic lobe (series 21/image 26), new. Given the perispinal finding, these are suspicious for necrotic metastases. Abscesses are also possible.  Background heterogeneous perfusion of the liver.  Status post cholecystectomy. No intrahepatic or extrahepatic ductal dilatation.  Pancreas: Parenchymal atrophy.  Spleen: Within normal limits.  Adrenals/Urinary Tract: Adrenal glands are unremarkable.  Status post right nephrectomy. Native left renal atrophy with multiple probable acquired renal cysts. Transplant kidney in the left lower quadrant with additional small cysts. No hydronephrosis.  Bladder is thick-walled although underdistended.  Stomach/Bowel: Stomach is unremarkable.  No evidence of bowel obstruction.  Extensive colonic diverticulosis, without associated inflammatory changes.  Vascular/Lymphatic: Atherosclerotic calcifications of the abdominal aorta and branch vessels.  No suspicious abdominopelvic lymphadenopathy.  Reproductive: Prostate is grossly unremarkable.  Other: No abdominopelvic ascites.  Musculoskeletal: Degenerative changes of the lumbar spine.  IMPRESSION: Right paraspinal soft tissue mass, measuring at least 3.3 cm, worrisome for primary or metastatic malignancy. Associated osseous destruction/pathologic fracture of the right T2 transverse process.  Two hepatic lesions measuring up to 6.0 cm, worrisome for metastases, less likely abscesses.  Additional ancillary findings as above.  Electronically Signed: By: Julian Hy M.D. On: 12/26/2013 17:44   US Biopsy  12/29/2013   CLINICAL DATA:  53 year old with thoracic  paraspinal lesions and liver lesions. Tissue diagnosis is needed.  EXAM: ULTRASOUND-GUIDED LIVER LESION BIOPSY  Physician: Stephan Minister. Anselm Pancoast, MD  FLUOROSCOPY TIME:  None  MEDICATIONS: 0.5 mg Versed, 25 mcg fentanyl. A radiology nurse monitored the patient for moderate sedation.  ANESTHESIA/SEDATION: Moderate sedation time: 15 min  PROCEDURE: The procedure was explained to the patient. The risks and benefits of the procedure were discussed and the patient's questions were addressed. Informed consent was obtained from the patient. The liver was evaluated with ultrasound. The lesion in the anterior left hepatic lobe was selected for biopsy. The anterior abdomen was prepped with Betadine and a sterile field was created. 1% lidocaine was used for local anesthetic. A 17 gauge needle was directed into the lesion with ultrasound guidance. A total of 3 core biopsies were obtained with an 18 gauge core device. Two core biopsies were placed in formalin and a third was placed in saline. 17 gauge needle was removed without complication.  FINDINGS: There is a heterogeneous hypoechoic lesion in the anterior left hepatic lobe. Patient also has a lesion in the posterior right hepatic lobe. Needle position confirmed within the left hepatic lobe lesion.  COMPLICATIONS: None  IMPRESSION: Ultrasound-guided core biopsies of a left hepatic lobe lesion.   Electronically Signed   By: [name] Daft M.D.   On: 12/29/2013 10:43   Dg Chest Port 1 View  01/01/2014   CLINICAL DATA:  Fever for 2 days which short of breath.  EXAM: PORTABLE CHEST - 1 VIEW  COMPARISON:  Radiograph 12/22/2013  FINDINGS: Right-sided defibrillator noted. Sternotomy wires overlie stable mildly enlarged cardiac silhouette. Patient is rotated leftward which does exaggerates the right hilum. No overt pulmonary edema. No pleural fluid. No pneumothorax.  IMPRESSION: No significant change.  No acute cardiopulmonary findings.   Electronically Signed   By: Suzy Bouchard M.D.    On: 01/01/2014 12:23    Microbiology: Recent Results (from the past 240 hour(s))  MRSA PCR Screening     Status: None   Collection Time: 12/26/13 11:15 PM  Result Value Ref Range Status   MRSA by PCR NEGATIVE NEGATIVE Final    Comment:        The GeneXpert MRSA Assay (FDA approved for NASAL specimens only), is one component of a comprehensive MRSA colonization surveillance program. It is not intended to diagnose MRSA infection nor to guide or monitor treatment for MRSA infections.  Culture, blood (routine x 2)     Status: None (Preliminary result)   Collection Time: 01/01/14  8:41 AM  Result Value Ref Range Status   Specimen Description BLOOD RIGHT ARM  Final   Special Requests BOTTLES DRAWN AEROBIC AND ANAEROBIC 10CC  Final   Culture  Setup Time   Final    01/01/2014 20:10 Performed at [facility]  Culture   Final           BLOOD CULTURE RECEIVED NO GROWTH TO DATE CULTURE WILL BE HELD FOR 5 DAYS BEFORE ISSUING A FINAL NEGATIVE REPORT Performed at [facility]    Report Status PENDING  Incomplete  Culture, blood (routine x 2)     Status: None (Preliminary result)   Collection Time: 01/01/14 10:15 AM  Result Value Ref Range Status   Specimen Description BLOOD RIGHT WRIST  Final   Special Requests BOTTLES DRAWN AEROBIC AND ANAEROBIC 10CC  Final   Culture  Setup Time   Final    01/01/2014 20:10 Performed at [facility]    Culture   Final           BLOOD CULTURE RECEIVED NO GROWTH TO DATE CULTURE WILL BE HELD FOR 5 DAYS BEFORE ISSUING A FINAL NEGATIVE REPORT Note: Culture results may be compromised due to an excessive volume of blood received in culture bottles. Performed at [facility]    Report Status PENDING  Incomplete  Clostridium Difficile by PCR     Status: None   Collection Time: 01/01/14 12:51 PM  Result Value Ref Range Status   C difficile by pcr NEGATIVE NEGATIVE Final     Labs: Basic Metabolic Panel:  Recent  Labs Lab 12/29/13 1252 12/31/13 0355 12/31/13 1750 01/02/14 0351  NA 135* 137 135* 135*  K 5.4* 5.4* 5.9* 3.6*  CL 93* 96 93* 94*  CO2 _0 GLUCOSE 132* 126* 128* 118*  BUN 67* 64* 78* 62*  CREATININE 6.10* 5.02* 5.81* 4.87*  CALCIUM 8.6 8.7 8.8 8.4  PHOS 4.9* 3.9 4.4  --    Liver Function Tests:  Recent Labs Lab 12/29/13 1252 12/31/13 0355 12/31/13 1750  ALBUMIN 2.1* 1.8* 2.0*   No results for input(s): LIPASE, AMYLASE in the last 168 hours. No results for input(s): AMMONIA in the last 168 hours. CBC:  Recent Labs Lab 12/29/13 1252 12/29/13 2354 12/31/13 1750  WBC 15.4* 15.0* 18.6*  HGB 9.9* 10.4* 10.4*  HCT 30.7* 31.6* 31.1*  MCV 99.0 97.2 95.4  PLT 229 214 246   Cardiac Enzymes: No results for input(s): CKTOTAL, CKMB, CKMBINDEX, TROPONINI in the last 168 hours. BNP: BNP (last 3 results) No results for input(s): PROBNP in the last 8760 hours. CBG:  Recent Labs Lab 12/30/13 0754 12/30/13 1647  GLUCAP 127* 119*       Signed:  Charlynne Cousins  [facility] 01/05/2014, 12:43 PM

## 2014-01-03 NOTE — Progress Notes (Signed)
[id number] Pt. A/Ox4 he had no c/o pain. Oxygen was increased from 2L to 4L [place] when patient was sleeping  because he had periods where his oxygen level would lower to 87-88% non-sustained, then increase to 93-94%. IV taken out. Report called to [place]. Gave report to Amy, Therapist, sports.

## 2014-01-05 DIAGNOSIS — D479 Neoplasm of uncertain behavior of lymphoid, hematopoietic and related tissue, unspecified: Secondary | ICD-10-CM | POA: Diagnosis present

## 2014-01-07 LAB — CULTURE, BLOOD (ROUTINE X 2)
CULTURE: NO GROWTH
Culture: NO GROWTH

## 2014-01-17 ENCOUNTER — Telehealth: Payer: Self-pay

## 2014-01-17 NOTE — Telephone Encounter (Signed)
Patient died @ [facility] in [place] per Obituary °

## 2014-01-31 DIAGNOSIS — N186 End stage renal disease: Secondary | ICD-10-CM | POA: Insufficient documentation

## 2014-01-31 DIAGNOSIS — Z992 Dependence on renal dialysis: Secondary | ICD-10-CM

## 2014-01-31 DIAGNOSIS — M549 Dorsalgia, unspecified: Secondary | ICD-10-CM | POA: Insufficient documentation

## 2014-01-31 DIAGNOSIS — R222 Localized swelling, mass and lump, trunk: Secondary | ICD-10-CM | POA: Insufficient documentation

## 2014-02-01 NOTE — Progress Notes (Signed)
Summary:  Patient was referred this morning to CSW to assist with Hospice Home Placement and per MD was medically stable for d/c.  He has decided to stop all dialysis treatments and desires comfort care only. CSW met with patient's wife [name] and his mother who was at bedside to discuss Hospice home referrals. They had indicated to Palliative Care team member that [facility] was the desired facility and thus- a referral was initiated to [facility] this morning. Patient was assessed by Abigail Butts, [facility] Nurse- who also met with patient and wife. Bed offer received and arrangements made for d/c today to Hospice home via EMS.  Patient was sleeping soundly each time CSW entered room and his wife stated that he was poorly responsive. RN notified to call report and EMS arranged. Lorie Phenix. Pauline Good, [name]

## 2014-02-01 NOTE — Progress Notes (Signed)
CSW spoke to [place]- Development worker, international aid for [place]. She indicated that patient expired this morning.  Discussed with Dr. Aileen Fass who was aware of above. Lorie Phenix. Pauline Good, [place]

## 2014-02-01 DEATH — deceased
